# Patient Record
Sex: Male | Born: 1955 | Race: White | Hispanic: No | Smoking: Current every day smoker
Health system: Southern US, Community
[De-identification: ages and names within clinical notes are randomized; demographics above are authoritative.]

## PROBLEM LIST (undated history)

## (undated) DIAGNOSIS — N200 Calculus of kidney: Secondary | ICD-10-CM

## (undated) DIAGNOSIS — R42 Dizziness and giddiness: Secondary | ICD-10-CM

## (undated) DIAGNOSIS — I6523 Occlusion and stenosis of bilateral carotid arteries: Secondary | ICD-10-CM

## (undated) DIAGNOSIS — R0600 Dyspnea, unspecified: Secondary | ICD-10-CM

## (undated) DIAGNOSIS — M5136 Other intervertebral disc degeneration, lumbar region with discogenic back pain only: Secondary | ICD-10-CM

## (undated) DIAGNOSIS — G588 Other specified mononeuropathies: Secondary | ICD-10-CM

## (undated) DIAGNOSIS — E278 Other specified disorders of adrenal gland: Secondary | ICD-10-CM

## (undated) DIAGNOSIS — IMO0001 Reserved for inherently not codable concepts without codable children: Secondary | ICD-10-CM

## (undated) DIAGNOSIS — I10 Essential (primary) hypertension: Secondary | ICD-10-CM

## (undated) DIAGNOSIS — M5134 Other intervertebral disc degeneration, thoracic region: Secondary | ICD-10-CM

## (undated) DIAGNOSIS — F17200 Nicotine dependence, unspecified, uncomplicated: Secondary | ICD-10-CM

## (undated) DIAGNOSIS — R7303 Prediabetes: Secondary | ICD-10-CM

## (undated) DIAGNOSIS — C801 Malignant (primary) neoplasm, unspecified: Secondary | ICD-10-CM

## (undated) DIAGNOSIS — M48061 Spinal stenosis, lumbar region without neurogenic claudication: Secondary | ICD-10-CM

## (undated) DIAGNOSIS — I639 Cerebral infarction, unspecified: Secondary | ICD-10-CM

## (undated) DIAGNOSIS — E119 Type 2 diabetes mellitus without complications: Secondary | ICD-10-CM

## (undated) DIAGNOSIS — A048 Other specified bacterial intestinal infections: Secondary | ICD-10-CM

## (undated) DIAGNOSIS — K31A0 Gastric intestinal metaplasia, unspecified: Secondary | ICD-10-CM

## (undated) DIAGNOSIS — K219 Gastro-esophageal reflux disease without esophagitis: Secondary | ICD-10-CM

## (undated) DIAGNOSIS — Z794 Long term (current) use of insulin: Secondary | ICD-10-CM

## (undated) DIAGNOSIS — E785 Hyperlipidemia, unspecified: Secondary | ICD-10-CM

## (undated) DIAGNOSIS — M47816 Spondylosis without myelopathy or radiculopathy, lumbar region: Secondary | ICD-10-CM

## (undated) DIAGNOSIS — R229 Localized swelling, mass and lump, unspecified: Secondary | ICD-10-CM

## (undated) DIAGNOSIS — I2510 Atherosclerotic heart disease of native coronary artery without angina pectoris: Secondary | ICD-10-CM

## (undated) DIAGNOSIS — F32A Depression, unspecified: Secondary | ICD-10-CM

## (undated) DIAGNOSIS — E1121 Type 2 diabetes mellitus with diabetic nephropathy: Secondary | ICD-10-CM

## (undated) DIAGNOSIS — J449 Chronic obstructive pulmonary disease, unspecified: Secondary | ICD-10-CM

## (undated) DIAGNOSIS — G894 Chronic pain syndrome: Secondary | ICD-10-CM

## (undated) DIAGNOSIS — I70263 Atherosclerosis of native arteries of extremities with gangrene, bilateral legs: Secondary | ICD-10-CM

## (undated) HISTORY — PX: SHOULDER SURGERY: SHX246

## (undated) HISTORY — DX: Type 2 diabetes mellitus without complications: E11.9

## (undated) HISTORY — PX: CHOLECYSTECTOMY: SHX55

## (undated) HISTORY — PX: PROSTATE SURGERY: SHX751

## (undated) HISTORY — DX: Chronic obstructive pulmonary disease, unspecified: J44.9

## (undated) HISTORY — DX: Hyperlipidemia, unspecified: E78.5

## (undated) HISTORY — PX: INSERTION PROSTATE RADIATION SEED: SUR718

## (undated) HISTORY — DX: Essential (primary) hypertension: I10

## (undated) HISTORY — DX: Cerebral infarction, unspecified: I63.9

---

## 2009-12-25 ENCOUNTER — Ambulatory Visit: Payer: Self-pay | Admitting: Urology

## 2013-04-13 ENCOUNTER — Emergency Department: Payer: Self-pay | Admitting: Emergency Medicine

## 2013-04-13 LAB — COMPREHENSIVE METABOLIC PANEL
Albumin: 4.1 g/dL (ref 3.4–5.0)
Anion Gap: 5 — ABNORMAL LOW (ref 7–16)
BUN: 15 mg/dL (ref 7–18)
Bilirubin,Total: 0.6 mg/dL (ref 0.2–1.0)
Calcium, Total: 9.3 mg/dL (ref 8.5–10.1)
Co2: 25 mmol/L (ref 21–32)
Creatinine: 1.31 mg/dL — ABNORMAL HIGH (ref 0.60–1.30)
EGFR (Non-African Amer.): >60
Glucose: 155 mg/dL — ABNORMAL HIGH (ref 65–99)
Osmolality: 278 (ref 275–301)
SGOT(AST): 23 U/L (ref 15–37)
Sodium: 137 mmol/L (ref 136–145)
Total Protein: 7.3 g/dL (ref 6.4–8.2)

## 2013-04-13 LAB — URINALYSIS, COMPLETE
Bilirubin,UR: NEGATIVE
Leukocyte Esterase: NEGATIVE
Nitrite: NEGATIVE
Protein: NEGATIVE
RBC,UR: 88 /HPF (ref 0–5)
Squamous Epithelial: <1
WBC UR: 2 /HPF (ref 0–5)

## 2013-04-13 LAB — CBC
HGB: 16 g/dL (ref 13.0–18.0)
MCH: 32.6 pg (ref 26.0–34.0)
MCHC: 34.3 g/dL (ref 32.0–36.0)
Platelet: 234 10*3/uL (ref 150–440)
RBC: 4.91 10*6/uL (ref 4.40–5.90)
WBC: 12.3 10*3/uL — ABNORMAL HIGH (ref 3.8–10.6)

## 2013-05-25 DIAGNOSIS — J101 Influenza due to other identified influenza virus with other respiratory manifestations: Secondary | ICD-10-CM

## 2013-05-25 DIAGNOSIS — J209 Acute bronchitis, unspecified: Secondary | ICD-10-CM

## 2013-05-25 DIAGNOSIS — C61 Malignant neoplasm of prostate: Secondary | ICD-10-CM

## 2013-05-25 HISTORY — DX: Acute bronchitis, unspecified: J20.9

## 2013-05-25 HISTORY — DX: Malignant neoplasm of prostate: C61

## 2013-05-25 HISTORY — DX: Influenza due to other identified influenza virus with other respiratory manifestations: J10.1

## 2013-08-08 DIAGNOSIS — J101 Influenza due to other identified influenza virus with other respiratory manifestations: Secondary | ICD-10-CM | POA: Insufficient documentation

## 2013-08-08 DIAGNOSIS — F17200 Nicotine dependence, unspecified, uncomplicated: Secondary | ICD-10-CM | POA: Insufficient documentation

## 2013-08-08 DIAGNOSIS — J209 Acute bronchitis, unspecified: Secondary | ICD-10-CM | POA: Insufficient documentation

## 2013-08-09 DIAGNOSIS — R229 Localized swelling, mass and lump, unspecified: Secondary | ICD-10-CM | POA: Insufficient documentation

## 2014-02-02 DIAGNOSIS — C61 Malignant neoplasm of prostate: Secondary | ICD-10-CM | POA: Insufficient documentation

## 2014-02-02 DIAGNOSIS — R7303 Prediabetes: Secondary | ICD-10-CM | POA: Insufficient documentation

## 2014-03-12 DIAGNOSIS — R42 Dizziness and giddiness: Secondary | ICD-10-CM | POA: Insufficient documentation

## 2016-12-19 ENCOUNTER — Encounter: Payer: Self-pay | Admitting: Emergency Medicine

## 2016-12-19 ENCOUNTER — Emergency Department
Admission: EM | Admit: 2016-12-19 | Discharge: 2016-12-19 | Disposition: A | Discharge status: Home / Self Care | Payer: Self-pay | Attending: Emergency Medicine | Admitting: Emergency Medicine

## 2016-12-19 DIAGNOSIS — F17200 Nicotine dependence, unspecified, uncomplicated: Secondary | ICD-10-CM | POA: Insufficient documentation

## 2016-12-19 DIAGNOSIS — Z91040 Latex allergy status: Secondary | ICD-10-CM | POA: Insufficient documentation

## 2016-12-19 DIAGNOSIS — K2970 Gastritis, unspecified, without bleeding: Secondary | ICD-10-CM | POA: Insufficient documentation

## 2016-12-19 DIAGNOSIS — R1013 Epigastric pain: Secondary | ICD-10-CM | POA: Insufficient documentation

## 2016-12-19 HISTORY — DX: Malignant (primary) neoplasm, unspecified: C80.1

## 2016-12-19 LAB — CBC
HEMATOCRIT: 46.7 % (ref 40.0–52.0)
Hemoglobin: 16.2 g/dL (ref 13.0–18.0)
MCH: 32.9 pg (ref 26.0–34.0)
MCHC: 34.8 g/dL (ref 32.0–36.0)
MCV: 94.6 fL (ref 80.0–100.0)
PLATELETS: 228 10*3/uL (ref 150–440)
RBC: 4.93 MIL/uL (ref 4.40–5.90)
RDW: 13.1 % (ref 11.5–14.5)
WBC: 8.3 10*3/uL (ref 3.8–10.6)

## 2016-12-19 LAB — COMPREHENSIVE METABOLIC PANEL
ALT: 31 U/L (ref 17–63)
AST: 28 U/L (ref 15–41)
Albumin: 4 g/dL (ref 3.5–5.0)
Alkaline Phosphatase: 83 U/L (ref 38–126)
Anion gap: 8 (ref 5–15)
BUN: 17 mg/dL (ref 6–20)
CHLORIDE: 106 mmol/L (ref 101–111)
CO2: 24 mmol/L (ref 22–32)
CREATININE: 1.1 mg/dL (ref 0.61–1.24)
Calcium: 9.3 mg/dL (ref 8.9–10.3)
GFR calc Af Amer: >60 mL/min (ref >60)
GFR calc non Af Amer: >60 mL/min (ref >60)
Glucose, Bld: 103 mg/dL — ABNORMAL HIGH (ref 65–99)
POTASSIUM: 3.9 mmol/L (ref 3.5–5.1)
SODIUM: 138 mmol/L (ref 135–145)
Total Bilirubin: 0.3 mg/dL (ref 0.3–1.2)
Total Protein: 6.9 g/dL (ref 6.5–8.1)

## 2016-12-19 LAB — URINALYSIS, COMPLETE (UACMP) WITH MICROSCOPIC
BACTERIA UA: NONE SEEN
BILIRUBIN URINE: NEGATIVE
Glucose, UA: NEGATIVE mg/dL
HGB URINE DIPSTICK: NEGATIVE
KETONES UR: NEGATIVE mg/dL
LEUKOCYTES UA: NEGATIVE
Nitrite: NEGATIVE
PROTEIN: NEGATIVE mg/dL
RBC / HPF: NONE SEEN RBC/hpf (ref 0–5)
SPECIFIC GRAVITY, URINE: 1.01 (ref 1.005–1.030)
SQUAMOUS EPITHELIAL / LPF: NONE SEEN
pH: 6 (ref 5.0–8.0)

## 2016-12-19 LAB — TROPONIN I

## 2016-12-19 LAB — LIPASE, BLOOD: LIPASE: 33 U/L (ref 11–51)

## 2016-12-19 MED ORDER — SUCRALFATE 1 G PO TABS: 1.0000 g | ORAL_TABLET | Freq: Four times a day (QID) | ORAL | 0 refills | Status: DC

## 2016-12-19 MED ORDER — OMEPRAZOLE 40 MG PO CPDR: 40.0000 mg | DELAYED_RELEASE_CAPSULE | Freq: Every day | ORAL | 0 refills | Status: DC

## 2016-12-19 MED ORDER — GI COCKTAIL ~~LOC~~
30.0000 mL | Freq: Once | ORAL | Status: AC
  Administered 2016-12-19: 30 mL via ORAL
  Filled 2016-12-19: qty 30

## 2016-12-19 NOTE — ED Triage Notes (Signed)
Pt to triage via EMS from home, report awoken by sharp epigastric pain, vomit x 2, nausea has subsided now.  Reports intermittent pains like this over past month.  Pt in NAD at this time.

## 2016-12-19 NOTE — ED Provider Notes (Signed)
[facility] Emergency Department Provider Note  ____________________________________________   First MD Initiated Contact with Patient 12/19/16 0354     (approximate)  I have reviewed the triage vital signs and the nursing notes.   HISTORY  Chief Complaint Abdominal Pain   HPI Devin Mayer is a 61 y.o. male with a history of prostate cancer who is presenting to the emergency Department today with epigastric abdominal pain. He says that he has had a sharp epigastric pain over the past month. He says that it worsens after eating and is associated with nausea. He has had several instances where the pain radiates through to his back. He says that he awoke tonight from sleep with sharp epigastric pain and vomited twice. He says the vomitus was clear. He denies any diarrhea. Says that he does not take any medications at home and has no history of reflux. Says that the pain is moderate right now and was severe when he woke up initially.   Past Medical History:  Diagnosis Date  . Cancer ([place])     There are no active problems to display for this patient.   History reviewed. No pertinent surgical history.  Prior to Admission medications   Not on File    Allergies Latex  History reviewed. No pertinent family history.  Social History Social History  Substance Use Topics  . Smoking status: Current Every Day Smoker  . Smokeless tobacco: Never Used  . Alcohol use Yes    Review of Systems  Constitutional: No fever/chills Eyes: No visual changes. ENT: No sore throat. Cardiovascular: Denies chest pain. Respiratory: Denies shortness of breath. Gastrointestinal:  No diarrhea.  No constipation. Genitourinary: Negative for dysuria. Musculoskeletal: Negative for back pain. Skin: Negative for rash. Neurological: Negative for headaches, focal weakness or numbness.   ____________________________________________   PHYSICAL EXAM:  VITAL SIGNS: ED  Triage Vitals  Enc Vitals Group     BP 12/19/16 0105 (!) 144/91     Pulse Rate 12/19/16 0105 90     Resp 12/19/16 0105 16     Temp 12/19/16 0105 97.9 F (36.6 C)     Temp Source 12/19/16 0105 Oral     SpO2 12/19/16 0105 94 %     Weight 12/19/16 0106 165 lb (74.8 kg)     Height 12/19/16 0106 5\' 6"  (1.676 m)     Head Circumference --      Peak Flow --      Pain Score 12/19/16 0105 7     Pain Loc --      Pain Edu? --      Excl. in [place]? --     Constitutional: Alert and oriented. Well appearing and in no acute distress. Eyes: Conjunctivae are normal.  Head: Atraumatic. Nose: No congestion/rhinnorhea. Mouth/Throat: Mucous membranes are moist.  Neck: No stridor.   Cardiovascular: Normal rate, regular rhythm. Grossly normal heart sounds.   Respiratory: Normal respiratory effort.  No retractions. Lungs CTAB. Gastrointestinal: Soft With moderate epigastric tenderness palpation. Negative Murphy sign. No distention.  Musculoskeletal: No lower extremity tenderness nor edema.  No joint effusions. Neurologic:  Normal speech and language. No gross focal neurologic deficits are appreciated. Skin:  Skin is warm, dry and intact. No rash noted. Psychiatric: Mood and affect are normal. Speech and behavior are normal.  ____________________________________________   LABS (all labs ordered are listed, but only abnormal results are displayed)  Labs Reviewed  COMPREHENSIVE METABOLIC PANEL - Abnormal; Notable for the following:  Result Value   Glucose, Bld 103 (*)    All other components within normal limits  URINALYSIS, COMPLETE (UACMP) WITH MICROSCOPIC - Abnormal; Notable for the following:    Color, Urine YELLOW (*)    APPearance CLEAR (*)    All other components within normal limits  LIPASE, BLOOD  CBC  TROPONIN I   ____________________________________________  EKG  ED ECG REPORT I, Schaevitz,  Youlanda Roys, the attending physician, personally viewed and interpreted this ECG.    Date: 12/19/2016  EKG Time: 0121  Rate: 86  Rhythm: normal sinus rhythm  Axis: Normal  Intervals:Incomplete right bundle branch block  ST&T Change: No ST segment elevation or depression. No abnormal T-wave inversion.  ____________________________________________  RADIOLOGY   ____________________________________________   PROCEDURES  Procedure(s) performed:   Procedures  Critical Care performed:   ____________________________________________   INITIAL IMPRESSION / ASSESSMENT AND PLAN / ED COURSE  Pertinent labs & imaging results that were available during my care of the patient were reviewed by me and considered in my medical decision making (see chart for details).  ----------------------------------------- 4:46 AM on 12/19/2016 -----------------------------------------  Patient is pain-free after GI cocktail. Reexamined his abdomen and he is nontender. Possible gastritis versus gastric ulcer. Reassuring lab work. I discussed further workup with the patient in the office with gastroenterology which may include an endoscopy. He'll be discharged on a PPI as well as sucralfate. He is understanding of the plan and willing to comply. Ongoing symptoms for one month and reassuring lab work as well as resolution of symptoms with GI cocktail. Very unlikely to be other catastrophic intra-abdominal pathology such as vascular catastrophe. We did discuss a mass such the cancer and the need for further follow-up. The patient as well as his daughter are understanding of this plan and willing to comply.       ____________________________________________   FINAL CLINICAL IMPRESSION(S) / ED DIAGNOSES  Epigastric pain. Gastritis.    NEW MEDICATIONS STARTED DURING THIS VISIT:  New Prescriptions   No medications on file     Note:  This document was prepared using Dragon voice recognition software and may include unintentional dictation errors.     Orbie Pyo,  MD 12/19/16 [phone]

## 2019-05-26 DIAGNOSIS — E8770 Fluid overload, unspecified: Secondary | ICD-10-CM

## 2019-05-26 DIAGNOSIS — K921 Melena: Secondary | ICD-10-CM

## 2019-05-26 HISTORY — DX: Melena: K92.1

## 2019-05-26 HISTORY — DX: Fluid overload, unspecified: E87.70

## 2019-10-19 DIAGNOSIS — E8770 Fluid overload, unspecified: Secondary | ICD-10-CM | POA: Insufficient documentation

## 2019-10-19 DIAGNOSIS — E278 Other specified disorders of adrenal gland: Secondary | ICD-10-CM | POA: Insufficient documentation

## 2019-10-19 DIAGNOSIS — N200 Calculus of kidney: Secondary | ICD-10-CM | POA: Insufficient documentation

## 2019-10-25 DIAGNOSIS — I10 Essential (primary) hypertension: Secondary | ICD-10-CM | POA: Insufficient documentation

## 2019-11-22 DIAGNOSIS — K921 Melena: Secondary | ICD-10-CM | POA: Insufficient documentation

## 2020-05-14 DIAGNOSIS — A048 Other specified bacterial intestinal infections: Secondary | ICD-10-CM | POA: Insufficient documentation

## 2020-05-14 DIAGNOSIS — K31A0 Gastric intestinal metaplasia, unspecified: Secondary | ICD-10-CM | POA: Insufficient documentation

## 2020-05-25 DIAGNOSIS — G8929 Other chronic pain: Secondary | ICD-10-CM

## 2020-05-25 HISTORY — DX: Other chronic pain: G89.29

## 2020-07-09 ENCOUNTER — Other Ambulatory Visit: Payer: Self-pay | Admitting: Ophthalmology

## 2020-07-09 DIAGNOSIS — H4921 Sixth [abducent] nerve palsy, right eye: Secondary | ICD-10-CM

## 2020-07-09 DIAGNOSIS — H532 Diplopia: Secondary | ICD-10-CM

## 2020-07-10 ENCOUNTER — Other Ambulatory Visit
Admission: RE | Admit: 2020-07-10 | Discharge: 2020-07-10 | Disposition: A | Discharge status: Home / Self Care | Payer: 59 | Source: Ambulatory Visit | Attending: Ophthalmology | Admitting: Ophthalmology

## 2020-07-10 DIAGNOSIS — H4921 Sixth [abducent] nerve palsy, right eye: Secondary | ICD-10-CM | POA: Insufficient documentation

## 2020-07-10 DIAGNOSIS — H532 Diplopia: Secondary | ICD-10-CM | POA: Insufficient documentation

## 2020-07-10 LAB — CBC WITH DIFFERENTIAL/PLATELET
Abs Immature Granulocytes: 0.02 10*3/uL (ref 0.00–0.07)
Basophils Absolute: 0.1 10*3/uL (ref 0.0–0.1)
Basophils Relative: 1 %
Eosinophils Absolute: 0.3 10*3/uL (ref 0.0–0.5)
Eosinophils Relative: 5 %
HCT: 49.4 % (ref 39.0–52.0)
Hemoglobin: 17.4 g/dL — ABNORMAL HIGH (ref 13.0–17.0)
Immature Granulocytes: 0 %
Lymphocytes Relative: 28 %
Lymphs Abs: 2 10*3/uL (ref 0.7–4.0)
MCH: 32.6 pg (ref 26.0–34.0)
MCHC: 35.2 g/dL (ref 30.0–36.0)
MCV: 92.5 fL (ref 80.0–100.0)
Monocytes Absolute: 0.6 10*3/uL (ref 0.1–1.0)
Monocytes Relative: 8 %
Neutro Abs: 4.1 10*3/uL (ref 1.7–7.7)
Neutrophils Relative %: 58 %
Platelets: 213 10*3/uL (ref 150–400)
RBC: 5.34 MIL/uL (ref 4.22–5.81)
RDW: 12.4 % (ref 11.5–15.5)
WBC: 7.2 10*3/uL (ref 4.0–10.5)
nRBC: 0 % (ref 0.0–0.2)

## 2020-07-10 LAB — SEDIMENTATION RATE: Sed Rate: 7 mm/hr (ref 0–20)

## 2020-07-10 LAB — C-REACTIVE PROTEIN: CRP: 2.3 mg/dL — ABNORMAL HIGH (ref <1.0)

## 2020-07-19 ENCOUNTER — Ambulatory Visit
Admission: RE | Admit: 2020-07-19 | Discharge: 2020-07-19 | Disposition: A | Discharge status: Home / Self Care | Payer: 59 | Source: Ambulatory Visit | Attending: Ophthalmology | Admitting: Ophthalmology

## 2020-07-19 ENCOUNTER — Other Ambulatory Visit: Payer: Self-pay

## 2020-07-19 DIAGNOSIS — H532 Diplopia: Secondary | ICD-10-CM | POA: Insufficient documentation

## 2020-07-19 DIAGNOSIS — H4921 Sixth [abducent] nerve palsy, right eye: Secondary | ICD-10-CM | POA: Insufficient documentation

## 2020-07-19 MED ORDER — GADOBUTROL 1 MMOL/ML IV SOLN
7.5000 mL | Freq: Once | INTRAVENOUS | Status: AC | PRN
  Administered 2020-07-19: 7.5 mL via INTRAVENOUS

## 2020-07-25 ENCOUNTER — Other Ambulatory Visit (INDEPENDENT_AMBULATORY_CARE_PROVIDER_SITE_OTHER): Payer: Self-pay | Admitting: Vascular Surgery

## 2020-07-25 DIAGNOSIS — R2681 Unsteadiness on feet: Secondary | ICD-10-CM

## 2020-07-25 DIAGNOSIS — I639 Cerebral infarction, unspecified: Secondary | ICD-10-CM

## 2020-07-25 DIAGNOSIS — H532 Diplopia: Secondary | ICD-10-CM

## 2020-07-26 ENCOUNTER — Ambulatory Visit (INDEPENDENT_AMBULATORY_CARE_PROVIDER_SITE_OTHER): Payer: 59 | Admitting: Nurse Practitioner

## 2020-07-26 ENCOUNTER — Encounter (INDEPENDENT_AMBULATORY_CARE_PROVIDER_SITE_OTHER): Payer: Self-pay | Admitting: Nurse Practitioner

## 2020-07-26 ENCOUNTER — Ambulatory Visit (INDEPENDENT_AMBULATORY_CARE_PROVIDER_SITE_OTHER): Payer: 59

## 2020-07-26 ENCOUNTER — Other Ambulatory Visit: Payer: Self-pay

## 2020-07-26 VITALS — BP 146/82 | HR 80 | Resp 16 | Ht 66.0 in | Wt 176.0 lb

## 2020-07-26 DIAGNOSIS — F17200 Nicotine dependence, unspecified, uncomplicated: Secondary | ICD-10-CM

## 2020-07-26 DIAGNOSIS — I639 Cerebral infarction, unspecified: Secondary | ICD-10-CM | POA: Diagnosis not present

## 2020-07-26 DIAGNOSIS — H532 Diplopia: Secondary | ICD-10-CM | POA: Diagnosis not present

## 2020-07-26 DIAGNOSIS — I10 Essential (primary) hypertension: Secondary | ICD-10-CM

## 2020-07-26 DIAGNOSIS — I6523 Occlusion and stenosis of bilateral carotid arteries: Secondary | ICD-10-CM

## 2020-07-26 DIAGNOSIS — R2681 Unsteadiness on feet: Secondary | ICD-10-CM

## 2020-07-26 NOTE — Progress Notes (Signed)
Subjective:    Patient ID: Devin Mayer, male    DOB: 05/11/1956, 66 y.o.   MRN: [id number] Chief Complaint  Patient presents with  . New Patient (Initial Visit)    Ref [place] carotid evaluate to see if pt need TA bx, stroke 07/07/20    Devin Mayer is a 65 year old male that presents today for evaluation related to recent vision changes.  The patient's wife notes that the patient was recently diagnosed with a stroke on 07/07/2020.  She notes that he had a "vessel burst" in his head and and they were told that this stopped spontaneously.  This also coincides with when his vision changes began.  His wife also notes that he has had trouble with his blood pressure recently and some reaching into the 200s.  Since that time the patient continues to have issues with vision as well as weakness.  He denies any fever or chills.  He denies any claudication or rest pain like symptoms.  Duplex ultrasound shows <40% stenosis bilaterally.  No previous test to compare to.   Review of Systems  Eyes: Positive for visual disturbance.  Neurological: Positive for weakness.  All other systems reviewed and are negative.      Objective:   Physical Exam Vitals reviewed.  HENT:     Head: Normocephalic.  Neck:     Vascular: No carotid bruit.  Cardiovascular:     Rate and Rhythm: Normal rate.     Pulses: Normal pulses.  Pulmonary:     Effort: Pulmonary effort is normal.  Skin:    General: Skin is warm and dry.  Neurological:     Mental Status: He is alert and oriented to person, place, and time.     Motor: Weakness present.  Psychiatric:        Mood and Affect: Mood normal.        Behavior: Behavior normal.        Thought Content: Thought content normal.        Judgment: Judgment normal.     BP (!) 146/82 (BP Location: Right Arm)   Pulse 80   Resp 16   Ht 5\' 6"  (1.676 m)   Wt 176 lb (79.8 kg)   BMI 28.41 kg/m   Past Medical History:  Diagnosis Date  . Cancer ([place])   . COPD  (chronic obstructive pulmonary disease) ([place])   . Diabetes mellitus without complication ([place])   . Hyperlipidemia   . Hypertension   . Stroke [facility])     Social History   Socioeconomic History  . Marital status: Married    Spouse name: Not on file  . Number of children: Not on file  . Years of education: Not on file  . Highest education level: Not on file  Occupational History  . Not on file  Tobacco Use  . Smoking status: Current Every Day Smoker    Types: Cigars  . Smokeless tobacco: Never Used  Substance and Sexual Activity  . Alcohol use: Yes  . Drug use: Never  . Sexual activity: Not on file  Other Topics Concern  . Not on file  Social History Narrative  . Not on file   Social Determinants of Health   Financial Resource Strain: Not on file  Food Insecurity: Not on file  Transportation Needs: Not on file  Physical Activity: Not on file  Stress: Not on file  Social Connections: Not on file  Intimate Partner Violence: Not on file    Past  Surgical History:  Procedure Laterality Date  . CHOLECYSTECTOMY    . SHOULDER SURGERY Bilateral     Family History  Problem Relation Age of Onset  . Heart attack Father   . Diabetes Father   . Heart attack Paternal Uncle   . Lung cancer Maternal Grandmother   . Lung cancer Paternal Grandmother     Allergies  Allergen Reactions  . Latex Rash    CBC Latest Ref Rng & Units 07/10/2020 12/19/2016 04/13/2013  WBC 4.0 - 10.5 K/uL 7.2 8.3 12.3(H)  Hemoglobin 13.0 - 17.0 g/dL 17.4(H) 16.2 16.0  Hematocrit 39.0 - 52.0 % 49.4 46.7 46.6  Platelets 150 - 400 K/uL 213 228 234      CMP     Component Value Date/Time   NA 138 12/19/2016 0113   NA 137 04/13/2013 1040   K 3.9 12/19/2016 0113   K 4.3 04/13/2013 1040   CL 106 12/19/2016 0113   CL 107 04/13/2013 1040   CO2 24 12/19/2016 0113   CO2 25 04/13/2013 1040   GLUCOSE 103 (H) 12/19/2016 0113   GLUCOSE 155 (H) 04/13/2013 1040   BUN 17 12/19/2016 0113   BUN 15  04/13/2013 1040   CREATININE 1.10 12/19/2016 0113   CREATININE 1.31 (H) 04/13/2013 1040   CALCIUM 9.3 12/19/2016 0113   CALCIUM 9.3 04/13/2013 1040   PROT 6.9 12/19/2016 0113   PROT 7.3 04/13/2013 1040   ALBUMIN 4.0 12/19/2016 0113   ALBUMIN 4.1 04/13/2013 1040   AST 28 12/19/2016 0113   AST 23 04/13/2013 1040   ALT 31 12/19/2016 0113   ALT 32 04/13/2013 1040   ALKPHOS 83 12/19/2016 0113   ALKPHOS 98 04/13/2013 1040   BILITOT 0.3 12/19/2016 0113   BILITOT 0.6 04/13/2013 1040   GFRNONAA >60 12/19/2016 0113   GFRNONAA >60 04/13/2013 1040   GFRAA >60 12/19/2016 0113   GFRAA >60 04/13/2013 1040     No results found.     Assessment & Plan:   1. Bilateral carotid artery stenosis Recommend:  Given the patient's asymptomatic subcritical stenosis no further invasive testing or surgery at this time.  Duplex ultrasound shows <40% stenosis bilaterally.  Continue antiplatelet therapy as prescribed Continue management of CAD, HTN and Hyperlipidemia Healthy heart diet,  encouraged exercise at least 4 times per week Follow up in 12 months with duplex ultrasound and physical exam   2. Primary hypertension Continue antihypertensive medications as already ordered, these medications have been reviewed and there are no changes at this time.  Patient is also advised to follow closely with PCP in regards to hypertension.  The patient's wife notes that he has had elevations previously up to over 200 and this may account for cause of stroke.   3. Smoking Smoking cessation was discussed, 3-10 minutes spent on this topic specifically    Current Outpatient Medications on File Prior to Visit  Medication Sig Dispense Refill  . Aspirin-Caffeine (BAYER BACK & BODY) 500-32.5 MG TABS Take by mouth.    . famotidine (PEPCID) 20 MG tablet TAKE 1 TABLET (20 MG TOTAL) BY MOUTH TWO (2) TIMES A DAY.    . hydrochlorothiazide (HYDRODIURIL) 25 MG tablet TAKE 1 TABLET (25 MG TOTAL) BY MOUTH DAILY. FOR HIGH  BLOOD PRESSURE AND SWELLING    . metFORMIN (GLUCOPHAGE) 500 MG tablet TAKE 1 TABLET WITH DINNER FOR HIGH SUGAR.    Marland Kitchen omeprazole (PRILOSEC) 40 MG capsule Take 1 capsule (40 mg total) by mouth daily. 30 capsule 0  .  sucralfate (CARAFATE) 1 g tablet Take 1 tablet (1 g total) by mouth 4 (four) times daily. 120 tablet 0   No current facility-administered medications on file prior to visit.    There are no Patient Instructions on file for this visit. No follow-ups on file.   Kris Hartmann, NP

## 2020-07-30 LAB — HEMOGLOBIN A1C: Hemoglobin A1C: 7.8

## 2020-08-01 DIAGNOSIS — I6523 Occlusion and stenosis of bilateral carotid arteries: Secondary | ICD-10-CM | POA: Insufficient documentation

## 2020-10-10 ENCOUNTER — Other Ambulatory Visit: Payer: Self-pay | Admitting: Internal Medicine

## 2020-10-10 DIAGNOSIS — G44201 Tension-type headache, unspecified, intractable: Secondary | ICD-10-CM

## 2020-10-10 DIAGNOSIS — R42 Dizziness and giddiness: Secondary | ICD-10-CM

## 2020-10-11 ENCOUNTER — Other Ambulatory Visit: Payer: Self-pay

## 2020-10-11 ENCOUNTER — Ambulatory Visit
Admission: RE | Admit: 2020-10-11 | Discharge: 2020-10-11 | Disposition: A | Discharge status: Home / Self Care | Payer: 59 | Source: Ambulatory Visit | Attending: Internal Medicine | Admitting: Internal Medicine

## 2020-10-11 DIAGNOSIS — G44201 Tension-type headache, unspecified, intractable: Secondary | ICD-10-CM | POA: Diagnosis present

## 2020-10-11 DIAGNOSIS — R42 Dizziness and giddiness: Secondary | ICD-10-CM | POA: Diagnosis not present

## 2020-10-18 ENCOUNTER — Other Ambulatory Visit: Payer: Self-pay | Admitting: Physician Assistant

## 2020-10-18 DIAGNOSIS — R0781 Pleurodynia: Secondary | ICD-10-CM

## 2020-10-24 ENCOUNTER — Ambulatory Visit: Payer: 59

## 2020-10-25 ENCOUNTER — Ambulatory Visit
Admission: RE | Admit: 2020-10-25 | Discharge: 2020-10-25 | Disposition: A | Discharge status: Home / Self Care | Payer: 59 | Source: Ambulatory Visit | Attending: Physician Assistant | Admitting: Physician Assistant

## 2020-10-25 ENCOUNTER — Ambulatory Visit: Payer: 59

## 2020-10-25 ENCOUNTER — Other Ambulatory Visit: Payer: Self-pay

## 2020-10-25 ENCOUNTER — Other Ambulatory Visit: Payer: 59

## 2020-10-25 DIAGNOSIS — R0781 Pleurodynia: Secondary | ICD-10-CM | POA: Diagnosis not present

## 2020-12-24 ENCOUNTER — Ambulatory Visit: Payer: 59 | Attending: Neurology | Admitting: Physical Therapy

## 2020-12-26 ENCOUNTER — Ambulatory Visit: Payer: 59 | Admitting: Physical Therapy

## 2020-12-31 ENCOUNTER — Ambulatory Visit: Payer: 59 | Admitting: Physical Therapy

## 2021-01-02 ENCOUNTER — Ambulatory Visit: Payer: 59 | Admitting: Physical Therapy

## 2021-01-06 ENCOUNTER — Encounter: Payer: 59 | Admitting: Physical Therapy

## 2021-01-08 ENCOUNTER — Encounter: Payer: 59 | Admitting: Physical Therapy

## 2021-01-13 ENCOUNTER — Encounter: Payer: 59 | Admitting: Physical Therapy

## 2021-01-15 ENCOUNTER — Encounter: Payer: 59 | Admitting: Physical Therapy

## 2021-01-20 ENCOUNTER — Encounter: Payer: 59 | Admitting: Physical Therapy

## 2021-01-22 ENCOUNTER — Encounter: Payer: 59 | Admitting: Physical Therapy

## 2021-01-22 ENCOUNTER — Encounter: Admission: RE | Payer: Self-pay | Source: Home / Self Care

## 2021-01-22 ENCOUNTER — Ambulatory Visit: Admission: RE | Admit: 2021-01-22 | Payer: 59 | Source: Home / Self Care | Admitting: Internal Medicine

## 2021-01-22 SURGERY — ESOPHAGOGASTRODUODENOSCOPY (EGD) WITH PROPOFOL
Anesthesia: General

## 2021-03-04 ENCOUNTER — Encounter: Payer: Self-pay | Admitting: Student in an Organized Health Care Education/Training Program

## 2021-03-04 ENCOUNTER — Ambulatory Visit
Payer: 59 | Attending: Student in an Organized Health Care Education/Training Program | Admitting: Student in an Organized Health Care Education/Training Program

## 2021-03-04 ENCOUNTER — Other Ambulatory Visit: Payer: Self-pay

## 2021-03-04 VITALS — BP 127/88 | HR 99 | Temp 97.1°F | Resp 18 | Ht 66.0 in | Wt 175.5 lb

## 2021-03-04 DIAGNOSIS — G8929 Other chronic pain: Secondary | ICD-10-CM

## 2021-03-04 DIAGNOSIS — G588 Other specified mononeuropathies: Secondary | ICD-10-CM

## 2021-03-04 DIAGNOSIS — G894 Chronic pain syndrome: Secondary | ICD-10-CM

## 2021-03-04 DIAGNOSIS — M51369 Other intervertebral disc degeneration, lumbar region without mention of lumbar back pain or lower extremity pain: Secondary | ICD-10-CM

## 2021-03-04 DIAGNOSIS — IMO0001 Reserved for inherently not codable concepts without codable children: Secondary | ICD-10-CM

## 2021-03-04 DIAGNOSIS — M5136 Other intervertebral disc degeneration, lumbar region: Secondary | ICD-10-CM | POA: Insufficient documentation

## 2021-03-04 DIAGNOSIS — M5416 Radiculopathy, lumbar region: Secondary | ICD-10-CM | POA: Insufficient documentation

## 2021-03-04 DIAGNOSIS — F17200 Nicotine dependence, unspecified, uncomplicated: Secondary | ICD-10-CM | POA: Insufficient documentation

## 2021-03-04 DIAGNOSIS — M5134 Other intervertebral disc degeneration, thoracic region: Secondary | ICD-10-CM

## 2021-03-04 NOTE — Assessment & Plan Note (Signed)
Patient has chronic pain. Studies show that people with chronic pain tend to be heavier smokers and often smoke in response to pain. Chronic pain makes smoking cessation more difficult and increases the risk of relapse after quitting.  Pt and I discussed the importance of managing pain during the process of smoking cessation.  We have developed a plan for smoking cessation and relapse prevention that includes management of of smoking in response to pain.

## 2021-03-04 NOTE — Patient Instructions (Signed)

## 2021-03-04 NOTE — Assessment & Plan Note (Signed)
Left-sided radiating thoracic pain that radiates to the anterior chest underneath the rib cage consistent with intercostal neuralgia.  Thoracic MRI negative for disc herniation that could be contributing to such symptoms.  Discussed diagnostic left-sided intercostal nerve block effective possible pulsed radiofrequency ablation.

## 2021-03-04 NOTE — Progress Notes (Signed)
Safety precautions to be maintained throughout the outpatient stay will include: orient to surroundings, keep bed in low position, maintain call bell within reach at all times, provide assistance with transfer out of bed and ambulation.  

## 2021-03-04 NOTE — Progress Notes (Signed)
Patient: Devin Mayer  Service Category: E/M  Provider: Gillis Santa, MD  DOB: 29-Nov-1955  DOS: 03/04/2021  Referring Provider: Gladstone Lighter, MD  MRN: [id number]  Setting: Ambulatory outpatient  PCP: Gladstone Lighter, MD  Type: New Patient  Specialty: Interventional Pain Management    Location: Office  Delivery: Face-to-face     Primary Reason(s) for Visit: Encounter for initial evaluation of one or more chronic problems (new to examiner) potentially causing chronic pain, and posing a threat to normal musculoskeletal function. (Level of risk: High) CC: Back Pain and Hip Pain (left)  HPI  Devin Mayer is a 65 y.o. year old, male patient, who comes for the first time to our practice referred by Gladstone Lighter, MD for our initial evaluation of his chronic pain. He has Acute bronchitis; Adrenal mass 1 cm to 4 cm in diameter ([place]); Intestinal metaplasia of gastric mucosa; Helicobacter pylori infection; Hematochezia; Hypertension; Influenza B; Nephrolithiasis; Pre-diabetes; Prostate cancer ([place]); Smoking; Subcutaneous nodule; Vertigo; Volume overload; Bilateral carotid artery stenosis; Chronic radicular lumbar pain; Lumbar degenerative disc disease; Degeneration of thoracic intervertebral disc; Intercostal neuralgia; and Chronic pain syndrome on their problem list. Today he comes in for evaluation of his Back Pain and Hip Pain (left)  Pain Assessment: Location: Lower Back Radiating: left hip, left ribcage, left leg to "just above knee"; BACK/numb, toothache-like; HIP/sharp; RIBCAGE/ ache Onset: More than a month ago Duration: Chronic pain Quality: Aching, Numbness Severity: 8 /10 (subjective, self-reported pain score)  Effect on ADL:   Timing: Constant Modifying factors: nothing BP: 127/88  HR: 99  Onset and Duration: Gradual Cause of pain: Unknown Severity: Getting worse and NAS-11 at its worse: 10/10 Timing: Not influenced by the time of the day Aggravating Factors: Eating, Walking,  Walking uphill, and Walking downhill Alleviating Factors: Walking Associated Problems: Night-time cramps, Fatigue, Weakness, Pain that wakes patient up, and Pain that does not allow patient to sleep Quality of Pain: Burning, Deep, Distressing, Nagging, Pressure-like, Sharp, Tender, and Tiring Previous Examinations or Tests: CT scan, MRI scan, and X-rays Previous Treatments: The patient denies none noted  Devin Mayer is a very pleasant 65 year old male who is accompanied by his wife who presents with a chief complaint of low back pain that started approximately 2 years ago without any inciting or traumatic event.  Later that year progressed to radiating leg pain as well more pronounced on the left side that extended in a dermatomal distribution to his left knee.  No inciting or traumatic event for onset of radiating leg pain.  Patient had to take retirement given increased pain.  He has been utilizing a cane to ambulate which she started doing approximately 12 months ago.  He had a stroke approximately 6 months ago which resulted in visual impairments for many months which has now resolved.  Patient does not endorse diplopia any longer.  He has tried physical therapy in the past.  He has been managing his pain with 4-5 Aleve's given day.  Patient also endorses left-sided rib pain that is worse with overhead extension.  His thoracic MRI is unremarkable.  Describes a burning and tingling sensation that wraps around his thoracic region to his anterior chest underneath his rib cage.  This seems to be consistent with intercostal neuralgia.  Denies prior history of shingles.  Of note patient has tried various neuropathic's in the past including gabapentin, pregabalin, nortriptyline, Cymbalta with limited response.  He does have a history of type 2 diabetes, not on insulin.  A1c was 7.8.  He denies any paresthesias of his feet.  Historic Controlled Substance Pharmacotherapy Review   Historical  Monitoring: The patient  reports no history of drug use. List of all UDS Test(s): No results found for: MDMA, COCAINSCRNUR, [place], [place], CANNABQUANT, THCU, [place] List of other Serum/Urine Drug Screening Test(s):  No results found for: AMPHSCRSER, BARBSCRSER, BENZOSCRSER, COCAINSCRSER, COCAINSCRNUR, PCPSCRSER, PCPQUANT, THCSCRSER, THCU, CANNABQUANT, OPIATESCRSER, OXYSCRSER, PROPOXSCRSER, ETH Historical Background Evaluation: [place] PMP: PDMP reviewed during this encounter. Online review of the past 48-month period conducted.              [place] Department of public safety, offender search: Editor, commissioning Information) Non-contributory Risk Assessment Profile: Aberrant behavior: None observed or detected today Risk factors for fatal opioid overdose: None identified today  Fatal overdose hazard ratio (HR): Calculation deferred Non-fatal overdose hazard ratio (HR): Calculation deferred Risk of opioid abuse or dependence: 0.7-3.0% with doses ? 36 MME/day and 6.1-26% with doses ? 120 MME/day. Substance use disorder (SUD) risk level: See below Personal History of Substance Abuse (SUD-Substance use disorder):  Alcohol: Negative  Illegal Drugs: Negative  Rx Drugs: Negative  ORT Risk Level calculation: Low Risk  Opioid Risk Tool - 03/04/21 0955       Family History of Substance Abuse   Alcohol Positive Male    Illegal Drugs Negative    Rx Drugs Negative      Personal History of Substance Abuse   Alcohol Negative    Illegal Drugs Negative    Rx Drugs Negative      Age   Age between 76-45 years  No      History of Preadolescent Sexual Abuse   History of Preadolescent Sexual Abuse Negative or Male      Psychological Disease   Psychological Disease Negative    Depression Negative      Total Score   Opioid Risk Tool Scoring 3    Opioid Risk Interpretation Low Risk            ORT Scoring interpretation table:  Score <3 = Low Risk for SUD  Score between 4-7 = Moderate Risk for SUD   Score >8 = High Risk for Opioid Abuse   PHQ-2 Depression Scale:  Total score:    PHQ-2 Scoring interpretation table: (Score and probability of major depressive disorder)  Score 0 = No depression  Score 1 = 15.4% Probability  Score 2 = 21.1% Probability  Score 3 = 38.4% Probability  Score 4 = 45.5% Probability  Score 5 = 56.4% Probability  Score 6 = 78.6% Probability   PHQ-9 Depression Scale:  Total score:    PHQ-9 Scoring interpretation table:  Score 0-4 = No depression  Score 5-9 = Mild depression  Score 10-14 = Moderate depression  Score 15-19 = Moderately severe depression  Score 20-27 = Severe depression (2.4 times higher risk of SUD and 2.89 times higher risk of overuse)   Pharmacologic Plan: As per protocol, I have not taken over any controlled substance management, pending the results of ordered tests and/or consults.            Initial impression: Pending review of available data and ordered tests.  Meds   Current Outpatient Medications:    aspirin EC 81 MG tablet, Take 81 mg by mouth daily. Swallow whole., Disp: , Rfl:    Aspirin-Caffeine (BAYER BACK & BODY) 500-32.5 MG TABS, Take by mouth., Disp: , Rfl:    famotidine (PEPCID) 20 MG tablet, TAKE 1 TABLET (20 MG TOTAL) BY MOUTH  TWO (2) TIMES A DAY., Disp: , Rfl:    hydrochlorothiazide (HYDRODIURIL) 25 MG tablet, TAKE 1 TABLET (25 MG TOTAL) BY MOUTH DAILY. FOR HIGH BLOOD PRESSURE AND SWELLING, Disp: , Rfl:    metFORMIN (GLUCOPHAGE) 500 MG tablet, TAKE 1 TABLET WITH DINNER FOR HIGH SUGAR., Disp: , Rfl:    omeprazole (PRILOSEC) 40 MG capsule, Take 1 capsule (40 mg total) by mouth daily., Disp: 30 capsule, Rfl: 0   sucralfate (CARAFATE) 1 g tablet, Take 1 tablet (1 g total) by mouth 4 (four) times daily., Disp: 120 tablet, Rfl: 0  Imaging Review   Thoracic Imaging: Thoracic MR wo contrast: Results for orders placed during the hospital encounter of 10/25/20  MR THORACIC SPINE WO CONTRAST  Narrative CLINICAL DATA:   Left rib pain.  EXAM: MRI THORACIC SPINE WITHOUT CONTRAST  TECHNIQUE: Multiplanar, multisequence MR imaging of the thoracic spine was performed. No intravenous contrast was administered.  COMPARISON:  CT abdomen pelvis 04/13/2013  FINDINGS: Alignment:  Normal alignment  Vertebrae: Normal bone marrow.  Negative for fracture or mass.  Cord:  Normal signal and morphology.  Paraspinal and other soft tissues: No paraspinous mass or adenopathy. Mild anterior spurring in the lower thoracic spine. Left lateral osteophyte at T6-5 6.  Right adrenal nodule 2.4 x 3.2 cm. This is unchanged from the prior CT 2014 which showed low density in the nodule compatible with adrenal adenoma.  Disc levels:  Mild disc degeneration in the thoracic spine. Mild disc space narrowing at T7-8. Several small Schmorl's nodes are present.  Negative for thoracic disc protrusion or spinal stenosis.  IMPRESSION: Mild thoracic disc degeneration.  No significant stenosis.  Right adrenal nodule unchanged from 2014   Electronically Signed By: Franchot Gallo M.D. On: 10/25/2020 08:48   Complexity Note: Imaging results reviewed. Results shared with Mr. Waggle, using Layman's terms.                         ROS  Cardiovascular: Chest pain and Blood thinners:  Anticoagulant Pulmonary or Respiratory: Shortness of breath, Smoking, Snoring , and Coughing up mucus (Bronchitis) Neurological: Stroke (Residual deficits or weakness: no long term loss noted) Psychological-Psychiatric: No reported psychological or psychiatric signs or symptoms such as difficulty sleeping, anxiety, depression, delusions or hallucinations (schizophrenial), mood swings (bipolar disorders) or suicidal ideations or attempts Gastrointestinal: Reflux or heatburn Genitourinary: Passing kidney stones Hematological: No reported hematological signs or symptoms such as prolonged bleeding, low or poor functioning platelets, bruising or bleeding  easily, hereditary bleeding problems, low energy levels due to low hemoglobin or being anemic Endocrine:  diabetes Rheumatologic: No reported rheumatological signs and symptoms such as fatigue, joint pain, tenderness, swelling, redness, heat, stiffness, decreased range of motion, with or without associated rash Musculoskeletal: Negative for myasthenia gravis, muscular dystrophy, multiple sclerosis or malignant hyperthermia Work History: Retired  Allergies  Mr. Debold is allergic to latex.  Laboratory Chemistry Profile   Renal Lab Results  Component Value Date   BUN 17 12/19/2016   CREATININE 1.10 12/19/2016   GFRAA >60 12/19/2016   GFRNONAA >60 12/19/2016   PROTEINUR NEGATIVE 12/19/2016     Electrolytes Lab Results  Component Value Date   NA 138 12/19/2016   K 3.9 12/19/2016   CL 106 12/19/2016   CALCIUM 9.3 12/19/2016     Hepatic Lab Results  Component Value Date   AST 28 12/19/2016   ALT 31 12/19/2016   ALBUMIN 4.0 12/19/2016   ALKPHOS 83 12/19/2016  LIPASE 33 12/19/2016     ID No results found for: LYMEIGGIGMAB, HIV, [place], STAPHAUREUS, MRSAPCR, HCVAB, PREGTESTUR, RMSFIGG, QFVRPH1IGG, QFVRPH2IGG, LYMEIGGIGMAB   Bone No results found for: VD25OH, IR485IO2VOJ, JK0938HW2, XH3716RC7, 25OHVITD1, 25OHVITD2, 25OHVITD3, TESTOFREE, TESTOSTERONE   Endocrine Lab Results  Component Value Date   GLUCOSE 103 (H) 12/19/2016   GLUCOSEU NEGATIVE 12/19/2016     Neuropathy No results found for: VITAMINB12, FOLATE, HGBA1C, HIV   CNS No results found for: COLORCSF, APPEARCSF, RBCCOUNTCSF, WBCCSF, POLYSCSF, LYMPHSCSF, EOSCSF, PROTEINCSF, GLUCCSF, JCVIRUS, CSFOLI, IGGCSF, LABACHR, ACETBL, LABACHR, ACETBL   Inflammation (CRP: Acute  ESR: Chronic) Lab Results  Component Value Date   CRP 2.3 (H) 07/10/2020   ESRSEDRATE 7 07/10/2020     Rheumatology No results found for: RF, ANA, LABURIC, URICUR, LYMEIGGIGMAB, LYMEABIGMQN, HLAB27   Coagulation Lab Results  Component  Value Date   PLT 213 07/10/2020     Cardiovascular Lab Results  Component Value Date   TROPONINI <0.03 12/19/2016   HGB 17.4 (H) 07/10/2020   HCT 49.4 07/10/2020     Screening No results found for: SARSCOV2NAA, COVIDSOURCE, STAPHAUREUS, MRSAPCR, HCVAB, HIV, PREGTESTUR   Cancer No results found for: CEA, CA125, LABCA2   Allergens No results found for: ALMOND, APPLE, ASPARAGUS, AVOCADO, BANANA, BARLEY, BASIL, BAYLEAF, GREENBEAN, LIMABEAN, WHITEBEAN, BEEFIGE, REDBEET, BLUEBERRY, BROCCOLI, CABBAGE, MELON, CARROT, CASEIN, CASHEWNUT, CAULIFLOWER, CELERY     Note: Lab results reviewed.  [place]  Drug: Mr. Ellenwood  reports no history of drug use. Alcohol:  reports current alcohol use. Tobacco:  reports that he has been smoking cigars. He has never used smokeless tobacco. Medical:  has a past medical history of Cancer ([place]), COPD (chronic obstructive pulmonary disease) ([place]), Diabetes mellitus without complication ([place]), Hyperlipidemia, Hypertension, and Stroke ([place]). Family: family history includes Diabetes in his father; Heart attack in his father and paternal uncle; Lung cancer in his maternal grandmother and paternal grandmother.  Past Surgical History:  Procedure Laterality Date   CHOLECYSTECTOMY     SHOULDER SURGERY Bilateral    Active Ambulatory Problems    Diagnosis Date Noted   Acute bronchitis 08/08/2013   Adrenal mass 1 cm to 4 cm in diameter ([place]) 10/19/2019   Intestinal metaplasia of gastric mucosa 89/38/1017   Helicobacter pylori infection 05/14/2020   Hematochezia 11/22/2019   Hypertension 10/25/2019   Influenza B 08/08/2013   Nephrolithiasis 10/19/2019   Pre-diabetes 02/02/2014   Prostate cancer ([place]) 02/02/2014   Smoking 08/08/2013   Subcutaneous nodule 08/09/2013   Vertigo 03/12/2014   Volume overload 10/19/2019   Bilateral carotid artery stenosis 08/01/2020   Chronic radicular lumbar pain 03/04/2021   Lumbar degenerative disc disease 03/04/2021   Degeneration  of thoracic intervertebral disc 03/04/2021   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   Chronic pain syndrome 03/04/2021   Resolved Ambulatory Problems    Diagnosis Date Noted   No Resolved Ambulatory Problems   Past Medical History:  Diagnosis Date   Cancer ([place])    COPD (chronic obstructive pulmonary disease) ([place])    Diabetes mellitus without complication ([place])    Hyperlipidemia    Stroke ([place])    Constitutional Exam  General appearance: Well nourished, well developed, and well hydrated. In no apparent acute distress Vitals:   03/04/21 1000  BP: 127/88  Pulse: 99  Resp: 18  Temp: (!) 97.1 F (36.2 C)  SpO2: 100%  Weight: 175 lb 8 oz (79.6 kg)  Height: _0  (1.676 m)   BMI Assessment: Estimated body mass index is 28.33 kg/m as calculated  from the following:   Height as of this encounter: $RemoveBeforeD'5\' 6"'[id number]$  (1.676 m).   Weight as of this encounter: 175 lb 8 oz (79.6 kg).  BMI interpretation table: BMI level Category Range association with higher incidence of chronic pain  <18 kg/m2 Underweight   18.5-24.9 kg/m2 Ideal body weight   25-29.9 kg/m2 Overweight Increased incidence by 20%  30-34.9 kg/m2 Obese (Class I) Increased incidence by 68%  35-39.9 kg/m2 Severe obesity (Class II) Increased incidence by 136%  >40 kg/m2 Extreme obesity (Class III) Increased incidence by 254%   Patient's current BMI Ideal Body weight  Body mass index is 28.33 kg/m. Ideal body weight: 63.8 kg (140 lb 10.5 oz) Adjusted ideal body weight: 70.1 kg (154 lb 9.5 oz)   BMI Readings from Last 4 Encounters:  03/04/21 28.33 kg/m  07/26/20 28.41 kg/m  12/19/16 26.63 kg/m   Wt Readings from Last 4 Encounters:  03/04/21 175 lb 8 oz (79.6 kg)  07/26/20 176 lb (79.8 kg)  12/19/16 165 lb (74.8 kg)    Psych/Mental status: Alert, oriented x 3 (person, place, & time)       Eyes: PERLA Respiratory: No evidence of acute respiratory distress  Thoracic Spine Area Exam  Skin & Axial Inspection: No masses, redness,  or swelling Alignment: Symmetrical Functional ROM: Unrestricted ROM Stability: No instability detected Muscle Tone/Strength: Functionally intact. No obvious neuro-muscular anomalies detected. Sensory (Neurological): Neuropathic pain pattern Radiating pain along left intercostal region. Muscle strength & Tone: No palpable anomalies Lumbar Spine Area Exam  Skin & Axial Inspection: No masses, redness, or swelling Alignment: Symmetrical Functional ROM: Pain restricted ROM       Stability: No instability detected Muscle Tone/Strength: Functionally intact. No obvious neuro-muscular anomalies detected. Sensory (Neurological): Dermatomal pain pattern  Ambulation: Unassisted Gait: Relatively normal for age and body habitus Posture: WNL  Lower Extremity Exam    Side: Right lower extremity  Side: Left lower extremity  Stability: No instability observed          Stability: No instability observed          Skin & Extremity Inspection: Skin color, temperature, and hair growth are WNL. No peripheral edema or cyanosis. No masses, redness, swelling, asymmetry, or associated skin lesions. No contractures.  Skin & Extremity Inspection: Skin color, temperature, and hair growth are WNL. No peripheral edema or cyanosis. No masses, redness, swelling, asymmetry, or associated skin lesions. No contractures.  Functional ROM: Unrestricted ROM                  Functional ROM: Unrestricted ROM                  Muscle Tone/Strength: Functionally intact. No obvious neuro-muscular anomalies detected.  Muscle Tone/Strength: Functionally intact. No obvious neuro-muscular anomalies detected.  Sensory (Neurological): Neurogenic pain pattern        Sensory (Neurological): Neurogenic pain pattern        DTR: Patellar: deferred today Achilles: deferred today Plantar: deferred today  DTR: Patellar: deferred today Achilles: deferred today Plantar: deferred today  Palpation: No palpable anomalies  Palpation: No palpable  anomalies    Assessment  Primary Diagnosis & Pertinent Problem List: The primary encounter diagnosis was Chronic radicular lumbar pain. Diagnoses of Lumbar degenerative disc disease, Degeneration of thoracic intervertebral disc, Intercostal neuralgia, Chronic pain syndrome, and Smoking were also pertinent to this visit.  Visit Diagnosis (New problems to examiner): 1. Chronic radicular lumbar pain   2. Lumbar degenerative disc disease   3.  Degeneration of thoracic intervertebral disc   4. Intercostal neuralgia   5. Chronic pain syndrome   6. Smoking    Plan of Care (Initial workup plan)  Note: Mr. Frix was reminded that as per protocol, today's visit has been an evaluation only. We have not taken over the patient's controlled substance management.  Problem-specific plan: Chronic radicular lumbar pain Low back pain with radiation into bilateral lower extremity dermatomal fashion that does not extend down the left knee.  Tried physical therapy in the past and has failed various neuropathic medications including gabapentin, Lyrica, nortriptyline, Cymbalta.  Recommend lumbar epidural steroid injection.    Intercostal neuralgia Left-sided radiating thoracic pain that radiates to the anterior chest underneath the rib cage consistent with intercostal neuralgia.  Thoracic MRI negative for disc herniation that could be contributing to such symptoms.  Discussed diagnostic left-sided intercostal nerve block effective possible pulsed radiofrequency ablation.  Chronic pain syndrome General Recommendations: The pain condition that the patient suffers from is best treated with a multidisciplinary approach that involves an increase in physical activity to prevent de-conditioning and worsening of the pain cycle, as well as psychological counseling (formal and/or informal) to address the co-morbid psychological affects of pain. Treatment will often involve judicious use of pain medications and  interventional procedures to decrease the pain, allowing the patient to participate in the physical activity that will ultimately produce long-lasting pain reductions. The goal of the multidisciplinary approach is to return the patient to a higher level of overall function and to restore their ability to perform activities of daily living.    Smoking Patient has chronic pain. Studies show that people with chronic pain tend to be heavier smokers and often smoke in response to pain. Chronic pain makes smoking cessation more difficult and increases the risk of relapse after quitting.  Pt and I discussed the importance of managing pain during the process of smoking cessation.  We have developed a plan for smoking cessation and relapse prevention that includes management of of smoking in response to pain.   Procedure Orders         Lumbar Epidural Injection     Interventional management options: Mr. Samaras was informed that there is no guarantee that he would be a candidate for interventional therapies. The decision will be based on the results of diagnostic studies, as well as Mr. Kapler's risk profile.  Procedure(s) under consideration:  Lumbar epidural steroid injection SI joint intervention Left intercostal nerve block, possible pulsed radiofrequency ablation    Provider-requested follow-up: Return in about 6 days (around 03/10/2021) for L-ESI, without sedation.  I spent a total of 60 minutes reviewing chart data, face-to-face evaluation with the patient, counseling and coordination of care as detailed above.   Future Appointments  Date Time Provider [place]  03/05/2021 10:00 AM Olin Hauser, DO [facility]  07/25/2021  8:30 AM [facility] 2 [facility] None  07/25/2021  9:30 AM Dew, Erskine Squibb, MD [facility] None    Note by: Gillis Santa, MD Date: 03/04/2021; Time: 2:38 PM

## 2021-03-04 NOTE — Assessment & Plan Note (Signed)
General Recommendations: The pain condition that the patient suffers from is best treated with a multidisciplinary approach that involves an increase in physical activity to prevent de-conditioning and worsening of the pain cycle, as well as psychological counseling (formal and/or informal) to address the co-morbid psychological affects of pain. Treatment will often involve judicious use of pain medications and interventional procedures to decrease the pain, allowing the patient to participate in the physical activity that will ultimately produce long-lasting pain reductions. The goal of the multidisciplinary approach is to return the patient to a higher level of overall function and to restore their ability to perform activities of daily living.

## 2021-03-04 NOTE — Assessment & Plan Note (Signed)
Low back pain with radiation into bilateral lower extremity dermatomal fashion that does not extend down the left knee.  Tried physical therapy in the past and has failed various neuropathic medications including gabapentin, Lyrica, nortriptyline, Cymbalta.  Recommend lumbar epidural steroid injection.

## 2021-03-05 ENCOUNTER — Ambulatory Visit (INDEPENDENT_AMBULATORY_CARE_PROVIDER_SITE_OTHER): Payer: 59 | Admitting: Family Medicine

## 2021-03-05 ENCOUNTER — Encounter: Payer: Self-pay | Admitting: Family Medicine

## 2021-03-05 VITALS — BP 126/74 | HR 91 | Ht 66.0 in | Wt 175.4 lb

## 2021-03-05 DIAGNOSIS — Z7689 Persons encountering health services in other specified circumstances: Secondary | ICD-10-CM | POA: Diagnosis not present

## 2021-03-05 DIAGNOSIS — Z23 Encounter for immunization: Secondary | ICD-10-CM

## 2021-03-05 DIAGNOSIS — Z794 Long term (current) use of insulin: Secondary | ICD-10-CM

## 2021-03-05 DIAGNOSIS — L301 Dyshidrosis [pompholyx]: Secondary | ICD-10-CM

## 2021-03-05 DIAGNOSIS — E1140 Type 2 diabetes mellitus with diabetic neuropathy, unspecified: Secondary | ICD-10-CM

## 2021-03-05 MED ORDER — METFORMIN HCL ER 500 MG PO TB24: 500.0000 mg | ORAL_TABLET | Freq: Every day | ORAL | 3 refills | Status: DC

## 2021-03-05 MED ORDER — TRIAMCINOLONE ACETONIDE 0.5 % EX CREA: 1.0000 "application " | TOPICAL_CREAM | Freq: Two times a day (BID) | CUTANEOUS | 2 refills | Status: DC

## 2021-03-05 NOTE — Patient Instructions (Addendum)
Thank you for coming to the office today.  Your provider would like to you have your annual eye exam. Please contact your current eye doctor or here are some good options for you to contact.   [facility]   Address: [address] [place], [place] [address] Phone: [phone]  Website: [email or web address] [address], [place], [place] [address] Phone: [phone] [email or web address]  [facility]  Address: [place], [place], [place] [address] Phone: [phone]   [facility] [address] [place], [place] [address] Phone: [phone]  [facility] Address: [place], [place], [place] [address]  Phone: [phone]   The rash looks most consistent with eczema, this can flare up and get worse due to a variety of factors (excessive dry skin from bathing/showering, soaps, cold weather / indoor heaters, outdoor exposures).  Use the topical steroid creams twice a day for up to 1 week, maximum duration of use per one flare is 10 to 14 days, then STOP using it and allow skin to recover. Caution with over-use may cause lightening of the skin.   Hydrocortisone on face only and the Triamcinolone / Kenalog on body only.  Tips to reduce Eczema Flares: For baths/showers, limit bathing to every other day if you can (max 1 x daily)  Use a gentle, unscented soap and lukewarm water (hot water is most irritating to skin) Never scrub skin with too much pressure, this causes more irritation. Pat skin dry, then leave it slightly damp. DO NOT scrub it dry. Apply steroid cream to skin and rub in all the way, wait 15 min, then apply a daily moisturizer (Vaseline, Eucerin, Aveeno, Lubriderm). Continue daily moisturizer every day of the year (even after flare is resolved) - If you have eczema on hands or dry hands, recommend wearing any type of gloves overnight (cloth, fabric, or even nitrile/latex) to improve effect of topical  moisturizer  If develops redness, honey colored crust oozing, drainage of pus, bleeding, or redness / swelling, pain, please return for re-evaluation, may have become infected after scratching.   Please schedule a Follow-up Appointment to: Return in about 3 months (around 06/05/2021) for 3 month DM A1c.  If you have any other questions or concerns, please feel free to call the office or send a message through [place]. You may also schedule an earlier appointment if necessary.  Additionally, you may be receiving a survey about your experience at our office within a few days to 1 week by e-mail or mail. We value your feedback.  Nobie Putnam, DO [place]

## 2021-03-05 NOTE — Progress Notes (Signed)
Subjective:    Patient ID: Devin Mayer, male    DOB: 31-Mar-1956, 65 y.o.   MRN: [id number]  Devin Mayer is a 65 y.o. male presenting on 03/05/2021 for Establish Care  Here to establish care. 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  CHRONIC DM, Type 2: Meds: Lantus 12 units daily, Januvia 50mg  daily, Metformin XR 500mg  daily in PM Reports good compliance. Tolerating well w/o side-effects Not on ACEi ARB Denies hypoglycemia, polyuria, visual changes, numbness or tingling.  CHRONIC HTN: Reports  Current Meds - None.   Denies CP, dyspnea, HA, edema, dizziness / lightheadedness  Health Maintenance: Due for Flu Shot, will receive today    Depression screen [facility] 2/9 03/05/2021  Decreased Interest 3  Down, Depressed, Hopeless 0  PHQ - 2 Score 3  Altered sleeping 1  Tired, decreased energy 3  Change in appetite 3  Feeling bad or failure about yourself  0  Trouble concentrating 0  Moving slowly or fidgety/restless 3  Suicidal thoughts 0  PHQ-9 Score 13  Difficult doing work/chores Very difficult   GAD 7 : Generalized Anxiety Score 03/05/2021  Nervous, Anxious, on Edge 0  Control/stop worrying 0  Worry too much - different things 0  Trouble relaxing 3  Restless 3  Easily annoyed or irritable 3  Afraid - awful might happen 0  Total GAD 7 Score 9  Anxiety Difficulty Somewhat difficult       Past Medical History:  Diagnosis Date   Cancer ([place])    COPD (chronic obstructive pulmonary disease) ([place])    Diabetes mellitus without complication ([place])    Hyperlipidemia    Hypertension    Stroke [facility])    Past Surgical History:  Procedure Laterality Date   CHOLECYSTECTOMY     SHOULDER SURGERY Bilateral    Social History   Socioeconomic History   Marital  status: Married    Spouse name: Not on file   Number of children: Not on file   Years of education: Not on file   Highest education level: Not on file  Occupational History   Not on file  Tobacco Use   Smoking status: Every Day    Types: Cigars   Smokeless tobacco: Never  Substance and Sexual Activity   Alcohol use: Yes   Drug use: Never   Sexual activity: Not on file  Other Topics Concern   Not on file  Social History Narrative   Not on file   Social Determinants of Health   Financial Resource Strain: Not on file  Food Insecurity: Not on file  Transportation Needs: Not on file  Physical Activity: Not on file  Stress: Not on file  Social Connections: Not on file  Intimate Partner Violence: Not on file   Family History  Problem Relation Age of Onset   Heart attack Father    Diabetes Father    Heart attack Paternal Uncle    Lung cancer Maternal Grandmother    Lung cancer Paternal Grandmother    Current Outpatient Medications on File Prior to Visit  Medication Sig   aspirin EC 81 MG tablet Take 81 mg by mouth daily. Swallow whole.   Aspirin-Caffeine (BAYER BACK & BODY) 500-32.5 MG TABS Take by mouth.   famotidine (PEPCID) 20 MG tablet TAKE 1 TABLET (20 MG  TOTAL) BY MOUTH TWO (2) TIMES A DAY.   insulin glargine (LANTUS SOLOSTAR) 100 UNIT/ML Solostar Pen Inject 12 Units into the skin daily.   JANUVIA 50 MG tablet Take 1 tablet (50 mg total) by mouth daily.   No current facility-administered medications on file prior to visit.    Review of Systems Per HPI unless specifically indicated above      Objective:    BP 126/74   Pulse 91   Ht 5\' 6"  (1.676 m)   Wt 175 lb 6.4 oz (79.6 kg)   SpO2 98%   BMI 28.31 kg/m   Wt Readings from Last 3 Encounters:  03/05/21 175 lb 6.4 oz (79.6 kg)  03/04/21 175 lb 8 oz (79.6 kg)  07/26/20 176 lb (79.8 kg)    Physical Exam Vitals and nursing note reviewed.  Constitutional:      General: He is not in acute distress.     Appearance: Normal appearance. He is well-developed. He is not diaphoretic.     Comments: Well-appearing, comfortable, cooperative  HENT:     Head: Normocephalic and atraumatic.  Eyes:     General:        Right eye: No discharge.        Left eye: No discharge.     Conjunctiva/sclera: Conjunctivae normal.  Neck:     Thyroid: No thyromegaly.  Cardiovascular:     Rate and Rhythm: Normal rate and regular rhythm.     Pulses: Normal pulses.     Heart sounds: Normal heart sounds. No murmur heard. Pulmonary:     Effort: Pulmonary effort is normal. No respiratory distress.     Breath sounds: Normal breath sounds. No wheezing or rales.  Musculoskeletal:        General: Normal range of motion.     Cervical back: Normal range of motion and neck supple.  Lymphadenopathy:     Cervical: No cervical adenopathy.  Skin:    General: Skin is warm and dry.     Findings: No erythema or rash.  Neurological:     Mental Status: He is alert and oriented to person, place, and time. Mental status is at baseline.  Psychiatric:        Mood and Affect: Mood normal.        Behavior: Behavior normal.        Thought Content: Thought content normal.     Comments: Well groomed, good eye contact, normal speech and thoughts    Diabetic Foot Exam - Simple   Simple Foot Form Diabetic Foot exam was performed with the following findings: Yes 03/05/2021 11:00 AM  Visual Inspection See comments: Yes Sensation Testing Intact to touch and monofilament testing bilaterally: Yes Pulse Check Posterior Tibialis and Dorsalis pulse intact bilaterally: Yes Comments Dry skin with callus formation. No ulceration. Intact sensation to monofilament.      Results for orders placed or performed in visit on 03/05/21  Hemoglobin A1c  Result Value Ref Range   Hemoglobin A1C 7.8       Assessment & Plan:   Problem List Items Addressed This Visit     Type 2 diabetes mellitus with diabetic neuropathy, with long-term current  use of insulin (HCC)   Relevant Medications   JANUVIA 50 MG tablet   insulin glargine (LANTUS SOLOSTAR) 100 UNIT/ML Solostar Pen   metFORMIN (GLUCOPHAGE-XR) 500 MG 24 hr tablet   Other Visit Diagnoses     Dyshidrotic eczema    -  Primary   Relevant  Medications   triamcinolone cream (KENALOG) 0.5 %   Encounter to establish care with new doctor       Needs flu shot       Relevant Orders   Flu Vaccine QUAD 21mo+IM (Fluarix, Fluzone & Alfiuria Quad PF) (Completed)       Request outside records.  Dyshidrotic Eczema Hands Rx Triamcinolone BID 2 weeks, moisturizers use per AVS Consider topical antifungal if area of concern.  T2DM Review outside records and prior A1c Continue current meds Reorder insulin metformin continue dosage  Chronic Pain Chest Wall / Neuralgia Followed by Pain Management / Neuro  Meds ordered this encounter  Medications   triamcinolone cream (KENALOG) 0.5 %    Sig: Apply 1 application topically 2 (two) times daily. To affected areas, for up to 2 weeks.    Dispense:  30 g    Refill:  2   metFORMIN (GLUCOPHAGE-XR) 500 MG 24 hr tablet    Sig: Take 1 tablet (500 mg total) by mouth daily with supper.    Dispense:  90 tablet    Refill:  3     Follow up plan: Return in about 3 months (around 06/05/2021) for 3 month DM A1c.  Nobie Putnam, DO [facility] 03/05/2021, 10:45 AM

## 2021-03-17 ENCOUNTER — Ambulatory Visit
Admission: RE | Admit: 2021-03-17 | Discharge: 2021-03-17 | Disposition: A | Discharge status: Home / Self Care | Payer: 59 | Source: Ambulatory Visit | Attending: Student in an Organized Health Care Education/Training Program | Admitting: Student in an Organized Health Care Education/Training Program

## 2021-03-17 ENCOUNTER — Other Ambulatory Visit: Payer: Self-pay

## 2021-03-17 ENCOUNTER — Ambulatory Visit (HOSPITAL_BASED_OUTPATIENT_CLINIC_OR_DEPARTMENT_OTHER): Payer: 59 | Admitting: Student in an Organized Health Care Education/Training Program

## 2021-03-17 ENCOUNTER — Encounter: Payer: Self-pay | Admitting: Student in an Organized Health Care Education/Training Program

## 2021-03-17 DIAGNOSIS — M5416 Radiculopathy, lumbar region: Secondary | ICD-10-CM

## 2021-03-17 DIAGNOSIS — G8929 Other chronic pain: Secondary | ICD-10-CM | POA: Insufficient documentation

## 2021-03-17 DIAGNOSIS — G894 Chronic pain syndrome: Secondary | ICD-10-CM | POA: Insufficient documentation

## 2021-03-17 MED ORDER — SODIUM CHLORIDE 0.9% FLUSH
2.0000 mL | Freq: Once | INTRAVENOUS | Status: AC
  Administered 2021-03-17: 10 mL

## 2021-03-17 MED ORDER — IOHEXOL 180 MG/ML  SOLN
10.0000 mL | Freq: Once | INTRAMUSCULAR | Status: AC
  Administered 2021-03-17: 10 mL via EPIDURAL
  Filled 2021-03-17: qty 20

## 2021-03-17 MED ORDER — ROPIVACAINE HCL 2 MG/ML IJ SOLN
2.0000 mL | Freq: Once | INTRAMUSCULAR | Status: AC
  Administered 2021-03-17: 20 mL via EPIDURAL

## 2021-03-17 MED ORDER — LIDOCAINE HCL 2 % IJ SOLN
20.0000 mL | Freq: Once | INTRAMUSCULAR | Status: AC
  Administered 2021-03-17: 200 mg
  Filled 2021-03-17: qty 10

## 2021-03-17 MED ORDER — DEXAMETHASONE SODIUM PHOSPHATE 10 MG/ML IJ SOLN
10.0000 mg | Freq: Once | INTRAMUSCULAR | Status: AC
  Administered 2021-03-17: 10 mg
  Filled 2021-03-17: qty 1

## 2021-03-17 NOTE — Progress Notes (Signed)
Safety precautions to be maintained throughout the outpatient stay will include: orient to surroundings, keep bed in low position, maintain call bell within reach at all times, provide assistance with transfer out of bed and ambulation.  

## 2021-03-17 NOTE — Patient Instructions (Signed)

## 2021-03-17 NOTE — Progress Notes (Signed)
PROVIDER NOTE: Information contained herein reflects review and annotations entered in association with encounter. Interpretation of such information and data should be left to medically-trained personnel. Information provided to patient can be located elsewhere in the medical record under "Patient Instructions". Document created using STT-dictation technology, any transcriptional errors that may result from process are unintentional.    Patient: Devin Mayer  Service Category: Procedure Provider: Gillis Santa, MD DOB: 1955-11-30 DOS: 03/17/2021 Location: [facility] Facility MRN: [id number] Setting: Ambulatory - outpatient Referring Provider: Gillis Santa, MD Type: Established Patient Specialty: Interventional Pain Management PCP: Olin Hauser, DO  Primary Reason for Visit: Interventional Pain Management Treatment. CC: Back Pain (Lumbar bilateral)   Procedure:          Anesthesia, Analgesia, Anxiolysis:  Type: Diagnostic Inter-Laminar Epidural Steroid Injection  #1  Region: Lumbar Level: L5-S1 Level. Laterality: Midline         Anesthesia: Local (1-2% Lidocaine)  Anxiolysis: None  Sedation: None  Guidance: Fluoroscopy           Position: Prone with head of the table was raised to facilitate breathing.   Indications: 1. Chronic radicular lumbar pain   2. Chronic pain syndrome    Pain Score: Pre-procedure: 7 /10 Post-procedure: 2 /10    Pre-op H&P Assessment:  Mr. Blixt is a 65 y.o. (year old), male patient, seen today for interventional treatment. He  has a past surgical history that includes Shoulder surgery (Bilateral) and Cholecystectomy. Mr. Whetsell has a current medication list which includes the following prescription(s): albuterol, aspirin, aspirin ec, bayer back & body, clonazepam, diclofenac, famotidine, lantus solostar, januvia, metformin, nortriptyline, potassium chloride sa, and triamcinolone cream. His primarily concern today is the Back Pain (Lumbar  bilateral)  Initial Vital Signs:  Pulse/HCG Rate: 98ECG Heart Rate: 98 Temp:  (!) 97.2 F (36.2 C) Resp: 16 BP: 137/73 SpO2: 99 %  BMI: Estimated body mass index is 27.76 kg/m as calculated from the following:   Height as of this encounter: 5\' 6"  (1.676 m).   Weight as of this encounter: 172 lb (78 kg).  Risk Assessment: Allergies: Reviewed. He is allergic to latex.  Allergy Precautions: None required Coagulopathies: Reviewed. None identified.  Blood-thinner therapy: None at this time Active Infection(s): Reviewed. None identified. Mr. Catoe is afebrile  Site Confirmation: Mr. Craun was asked to confirm the procedure and laterality before marking the site Procedure checklist: Completed Consent: Before the procedure and under the influence of no sedative(s), amnesic(s), or anxiolytics, the patient was informed of the treatment options, risks and possible complications. To fulfill our ethical and legal obligations, as recommended by the American Medical Association's Code of Ethics, I have informed the patient of my clinical impression; the nature and purpose of the treatment or procedure; the risks, benefits, and possible complications of the intervention; the alternatives, including doing nothing; the risk(s) and benefit(s) of the alternative treatment(s) or procedure(s); and the risk(s) and benefit(s) of doing nothing. The patient was provided information about the general risks and possible complications associated with the procedure. These may include, but are not limited to: failure to achieve desired goals, infection, bleeding, organ or nerve damage, allergic reactions, paralysis, and death. In addition, the patient was informed of those risks and complications associated to Spine-related procedures, such as failure to decrease pain; infection (i.e.: Meningitis, epidural or intraspinal abscess); bleeding (i.e.: epidural hematoma, subarachnoid hemorrhage, or any other type of  intraspinal or peri-dural bleeding); organ or nerve damage (i.e.: Any type of peripheral nerve,  nerve root, or spinal cord injury) with subsequent damage to sensory, motor, and/or autonomic systems, resulting in permanent pain, numbness, and/or weakness of one or several areas of the body; allergic reactions; (i.e.: anaphylactic reaction); and/or death. Furthermore, the patient was informed of those risks and complications associated with the medications. These include, but are not limited to: allergic reactions (i.e.: anaphylactic or anaphylactoid reaction(s)); adrenal axis suppression; blood sugar elevation that in diabetics may result in ketoacidosis or comma; water retention that in patients with history of congestive heart failure may result in shortness of breath, pulmonary edema, and decompensation with resultant heart failure; weight gain; swelling or edema; medication-induced neural toxicity; particulate matter embolism and blood vessel occlusion with resultant organ, and/or nervous system infarction; and/or aseptic necrosis of one or more joints. Finally, the patient was informed that Medicine is not an exact science; therefore, there is also the possibility of unforeseen or unpredictable risks and/or possible complications that may result in a catastrophic outcome. The patient indicated having understood very clearly. We have given the patient no guarantees and we have made no promises. Enough time was given to the patient to ask questions, all of which were answered to the patient's satisfaction. Mr. Bracewell has indicated that he wanted to continue with the procedure. Attestation: I, the ordering provider, attest that I have discussed with the patient the benefits, risks, side-effects, alternatives, likelihood of achieving goals, and potential problems during recovery for the procedure that I have provided informed consent. Date  Time: 03/17/2021 10:49 AM  Pre-Procedure Preparation:  Monitoring:  As per clinic protocol. Respiration, ETCO2, SpO2, BP, heart rate and rhythm monitor placed and checked for adequate function Safety Precautions: Patient was assessed for positional comfort and pressure points before starting the procedure. Time-out: I initiated and conducted the "Time-out" before starting the procedure, as per protocol. The patient was asked to participate by confirming the accuracy of the "Time Out" information. Verification of the correct person, site, and procedure were performed and confirmed by me, the nursing staff, and the patient. "Time-out" conducted as per Joint Commission's Universal Protocol (UP.01.01.01). Time: 1142  Description of Procedure:          Target Area: The interlaminar space, initially targeting the lower laminar border of the superior vertebral body. Approach: Paramedial approach. Area Prepped: Entire Posterior Lumbar Region DuraPrep (Iodine Povacrylex [0.7% available iodine] and Isopropyl Alcohol, 74% w/w) Safety Precautions: Aspiration looking for blood return was conducted prior to all injections. At no point did we inject any substances, as a needle was being advanced. No attempts were made at seeking any paresthesias. Safe injection practices and needle disposal techniques used. Medications properly checked for expiration dates. SDV (single dose vial) medications used. Description of the Procedure: Protocol guidelines were followed. The procedure needle was introduced through the skin, ipsilateral to the reported pain, and advanced to the target area. Bone was contacted and the needle walked caudad, until the lamina was cleared. The epidural space was identified using "loss-of-resistance technique" with 2-3 ml of PF-NaCl (0.9% NSS), in a 5cc LOR glass syringe.  Vitals:   03/17/21 1055 03/17/21 1145 03/17/21 1148  BP: 137/73 133/80 137/82  Pulse: 98    Resp: 16 20 20   Temp: (!) 97.2 F (36.2 C)    TempSrc: Temporal    SpO2: 99% 92% 92%  Weight: 172  lb (78 kg)    Height: 5\' 6"  (1.676 m)      Start Time: 1142 hrs. End Time: 1147 hrs.  Materials:  Needle(s) Type: Epidural needle Gauge: 22G Length: 3.5-in Medication(s): Please see orders for medications and dosing details. 6 cc solution made of 3 cc of preservative-free saline, 2 cc of 0.2% ropivacaine, 1 cc of Decadron 10 mg/cc.  Imaging Guidance (Spinal):          Type of Imaging Technique: Fluoroscopy Guidance (Spinal) Indication(s): Assistance in needle guidance and placement for procedures requiring needle placement in or near specific anatomical locations not easily accessible without such assistance. Exposure Time: Please see nurses notes. Contrast: Before injecting any contrast, we confirmed that the patient did not have an allergy to iodine, shellfish, or radiological contrast. Once satisfactory needle placement was completed at the desired level, radiological contrast was injected. Contrast injected under live fluoroscopy. No contrast complications. See chart for type and volume of contrast used. Fluoroscopic Guidance: I was personally present during the use of fluoroscopy. "Tunnel Vision Technique" used to obtain the best possible view of the target area. Parallax error corrected before commencing the procedure. "Direction-depth-direction" technique used to introduce the needle under continuous pulsed fluoroscopy. Once target was reached, antero-posterior, oblique, and lateral fluoroscopic projection used confirm needle placement in all planes. Images permanently stored in EMR. Interpretation: I personally interpreted the imaging intraoperatively. Adequate needle placement confirmed in multiple planes. Appropriate spread of contrast into desired area was observed. No evidence of afferent or efferent intravascular uptake. No intrathecal or subarachnoid spread observed. Permanent images saved into the patient's record.   Post-operative Assessment:  Post-procedure Vital Signs:   Pulse/HCG Rate: 9896 Temp:  (!) 97.2 F (36.2 C) Resp: 20 BP: 137/82 SpO2: 92 %  EBL: None  Complications: No immediate post-treatment complications observed by team, or reported by patient.  Note: The patient tolerated the entire procedure well. A repeat set of vitals were taken after the procedure and the patient was kept under observation following institutional policy, for this type of procedure. Post-procedural neurological assessment was performed, showing return to baseline, prior to discharge. The patient was provided with post-procedure discharge instructions, including a section on how to identify potential problems. Should any problems arise concerning this procedure, the patient was given instructions to immediately contact us, at any time, without hesitation. In any case, we plan to contact the patient by telephone for a follow-up status report regarding this interventional procedure.  Comments:  No additional relevant information.  5 out of 5 strength bilateral lower extremity: Plantar flexion, dorsiflexion, knee flexion, knee extension.   Plan of Care  Orders:  Orders Placed This Encounter  Procedures   DG PAIN CLINIC C-ARM 1-60 MIN NO REPORT    Intraoperative interpretation by procedural physician at [place].    Standing Status:   Standing    Number of Occurrences:   1    Order Specific Question:   Reason for exam:    Answer:   Assistance in needle guidance and placement for procedures requiring needle placement in or near specific anatomical locations not easily accessible without such assistance.    Medications ordered for procedure: Meds ordered this encounter  Medications   iohexol (OMNIPAQUE) 180 MG/ML injection 10 mL    Must be Myelogram-compatible. If not available, you may substitute with a water-soluble, non-ionic, hypoallergenic, myelogram-compatible radiological contrast medium.   lidocaine (XYLOCAINE) 2 % (with pres) injection 400 mg    sodium chloride flush (NS) 0.9 % injection 2 mL   ropivacaine (PF) 2 mg/mL (0.2%) (NAROPIN) injection 2 mL   dexamethasone (DECADRON) injection 10 mg   Medications administered:  We administered iohexol, lidocaine, sodium chloride flush, ropivacaine (PF) 2 mg/mL (0.2%), and dexamethasone.  See the medical record for exact dosing, route, and time of administration.  Follow-up plan:   Return in about 4 weeks (around 04/14/2021) for Post Procedure Evaluation, virtual.     L5/S1 ESI 03/17/21  Recent Visits Date Type Provider Dept  03/04/21 Office Visit Gillis Santa, MD [facility]  Showing recent visits within past 90 days and meeting all other requirements Today's Visits Date Type Provider Dept  03/17/21 Procedure visit Gillis Santa, MD [facility]  Showing today's visits and meeting all other requirements Future Appointments Date Type Provider Dept  04/14/21 Appointment Gillis Santa, MD [facility]  Showing future appointments within next 90 days and meeting all other requirements Disposition: Discharge home  Discharge (Date  Time): 03/17/2021; 1200 hrs.   Primary Care Physician: Olin Hauser, DO Location: [facility] Note by: Gillis Santa, MD Date: 03/17/2021; Time: 12:09 PM  Disclaimer:  Medicine is not an exact science. The only guarantee in medicine is that nothing is guaranteed. It is important to note that the decision to proceed with this intervention was based on the information collected from the patient. The Data and conclusions were drawn from the patient's questionnaire, the interview, and the physical examination. Because the information was provided in large part by the patient, it cannot be guaranteed that it has not been purposely or unconsciously manipulated. Every effort has been made to obtain as much relevant data as possible for this evaluation. It is important to note that the conclusions  that lead to this procedure are derived in large part from the available data. Always take into account that the treatment will also be dependent on availability of resources and existing treatment guidelines, considered by other Pain Management Practitioners as being common knowledge and practice, at the time of the intervention. For Medico-Legal purposes, it is also important to point out that variation in procedural techniques and pharmacological choices are the acceptable norm. The indications, contraindications, technique, and results of the above procedure should only be interpreted and judged by a Board-Certified Interventional Pain Specialist with extensive familiarity and expertise in the same exact procedure and technique.

## 2021-03-18 ENCOUNTER — Telehealth: Payer: Self-pay

## 2021-03-18 NOTE — Telephone Encounter (Signed)
Post procedure phone call.  Lm 

## 2021-03-21 ENCOUNTER — Encounter: Payer: Self-pay | Admitting: Student in an Organized Health Care Education/Training Program

## 2021-03-28 ENCOUNTER — Encounter: Payer: Self-pay | Admitting: Student in an Organized Health Care Education/Training Program

## 2021-03-31 ENCOUNTER — Other Ambulatory Visit: Payer: Self-pay | Admitting: Student in an Organized Health Care Education/Training Program

## 2021-03-31 ENCOUNTER — Encounter: Payer: Self-pay | Admitting: Student in an Organized Health Care Education/Training Program

## 2021-03-31 DIAGNOSIS — G8929 Other chronic pain: Secondary | ICD-10-CM

## 2021-03-31 MED ORDER — PREDNISONE 20 MG PO TABS: ORAL_TABLET | ORAL | 0 refills | Status: AC

## 2021-04-14 ENCOUNTER — Other Ambulatory Visit: Payer: Self-pay

## 2021-04-14 ENCOUNTER — Ambulatory Visit
Payer: 59 | Attending: Student in an Organized Health Care Education/Training Program | Admitting: Student in an Organized Health Care Education/Training Program

## 2021-04-14 DIAGNOSIS — M5416 Radiculopathy, lumbar region: Secondary | ICD-10-CM | POA: Diagnosis not present

## 2021-04-14 DIAGNOSIS — G8929 Other chronic pain: Secondary | ICD-10-CM | POA: Diagnosis not present

## 2021-04-14 DIAGNOSIS — M5136 Other intervertebral disc degeneration, lumbar region: Secondary | ICD-10-CM | POA: Diagnosis not present

## 2021-04-14 DIAGNOSIS — G894 Chronic pain syndrome: Secondary | ICD-10-CM | POA: Diagnosis not present

## 2021-04-14 NOTE — Progress Notes (Signed)
Patient: Devin Mayer  Service Category: E/M  Provider: Gillis Santa, MD  DOB: 09-11-1955  DOS: 04/14/2021  Location: Office  MRN: [id number]  Setting: Ambulatory outpatient  Referring Provider: Nobie Putnam *  Type: Established Patient  Specialty: Interventional Pain Management  PCP: Olin Hauser, DO  Location: Remote location  Delivery: TeleHealth     Virtual Encounter - Pain Management PROVIDER NOTE: Information contained herein reflects review and annotations entered in association with encounter. Interpretation of such information and data should be left to medically-trained personnel. Information provided to patient can be located elsewhere in the medical record under "Patient Instructions". Document created using STT-dictation technology, any transcriptional errors that may result from process are unintentional.    Contact & Pharmacy Preferred: [phone] Home: [phone] (home) Mobile: [phone] (mobile) E-mail: [email or web address]  [id number] [place] [place] [place]Phone: [phone]: [phone]  Pre-screening  Mr. TLovena Leoffered "in-person" vs "virtual" encounter. He indicated preferring virtual for this encounter.   Reason COVID-19*  Social distancing based on CDC and AMA recommendations.   I contacted SHolman Bonsignoreon 04/14/2021 via telephone.      I clearly identified myself as BGillis Santa MD. I verified that I was speaking with the correct person using two identifiers (Name: SMickel Schreur and date of birth: 521-Feb-1957.  Consent I sought verbal advanced consent from SMarlene Bastfor virtual visit interactions. I informed Mr. TGildnerof possible security and privacy concerns, risks, and limitations associated with providing "not-in-person" medical evaluation and management services. I also informed Mr. TBerishaof the availability of "in-person" appointments. Finally, I informed him  that there would be a charge for the virtual visit and that he could be  personally, fully or partially, financially responsible for it. Mr. TKlueverexpressed understanding and agreed to proceed.   Historic Elements   Mr. SAvelino Herrenis a 65y.o. year old, male patient evaluated today after our last contact on 03/17/2021. Mr. TFails has a past medical history of Cancer ([facility], COPD (chronic obstructive pulmonary disease) ([place], Diabetes mellitus without complication ([place], Hyperlipidemia, Hypertension, and Stroke ([place]. He also  has a past surgical history that includes Shoulder surgery (Bilateral) and Cholecystectomy. Mr. TVitellihas a current medication list which includes the following prescription(s): albuterol, aspirin, aspirin ec, bayer back & body, clonazepam, diclofenac, famotidine, lantus solostar, januvia, metformin, nortriptyline, potassium chloride sa, and triamcinolone cream. He  reports that he has been smoking cigars. He has never used smokeless tobacco. He reports current alcohol use. He reports that he does not use drugs. Mr. TDenhamis allergic to latex.   HPI  Today, he is being contacted for a post-procedure assessment.    Post-Procedure Evaluation  Procedure (03/17/2021):  Procedure:          Anesthesia, Analgesia, Anxiolysis:  Type: Diagnostic Inter-Laminar Epidural Steroid Injection  #1  Region: Lumbar Level: L5-S1 Level. Laterality: Midline         Anesthesia: Local (1-2% Lidocaine)  Anxiolysis: None  Sedation: None  Guidance: Fluoroscopy           Position: Prone with head of the table was raised to facilitate breathing.   Indications: 1. Chronic radicular lumbar pain   2. Chronic pain syndrome    Pain Score: Pre-procedure: 7 /10 Post-procedure: 2 /10     Anxiolysis: Please see nurses note.  Effectiveness during initial hour after procedure (Ultra-Short Term Relief): 100 %   Local anesthetic used: Long-acting (  4-6 hours) Effectiveness: Defined as any  analgesic benefit obtained secondary to the administration of local anesthetics. This carries significant diagnostic value as to the etiological location, or anatomical origin, of the pain. Duration of benefit is expected to coincide with the duration of the local anesthetic used.  Effectiveness during initial 4-6 hours after procedure (Short-Term Relief): 100 %   Long-term benefit: Defined as any relief past the pharmacologic duration of the local anesthetics.  Effectiveness past the initial 6 hours after procedure (Long-Term Relief): 50 %   Benefits, current: Defined as benefit present at the time of this evaluation.   Analgesia: 5-10% Function: Mr. Willetts reports improvement in function    Laboratory Chemistry Profile   Renal Lab Results  Component Value Date   BUN 17 12/19/2016   CREATININE 1.10 12/19/2016   GFRAA >60 12/19/2016   GFRNONAA >60 12/19/2016    Hepatic Lab Results  Component Value Date   AST 28 12/19/2016   ALT 31 12/19/2016   ALBUMIN 4.0 12/19/2016   ALKPHOS 83 12/19/2016   LIPASE 33 12/19/2016    Electrolytes Lab Results  Component Value Date   NA 138 12/19/2016   K 3.9 12/19/2016   CL 106 12/19/2016   CALCIUM 9.3 12/19/2016    Bone No results found for: VD25OH, VD125OH2TOT, HT3428JG8, TL5726OM3, 25OHVITD1, 25OHVITD2, 25OHVITD3, TESTOFREE, TESTOSTERONE  Inflammation (CRP: Acute Phase) (ESR: Chronic Phase) Lab Results  Component Value Date   CRP 2.3 (H) 07/10/2020   ESRSEDRATE 7 07/10/2020         Note: Above Lab results reviewed.   Assessment  The primary encounter diagnosis was Chronic radicular lumbar pain. Diagnoses of Chronic pain syndrome and Other intervertebral disc degeneration, lumbar region were also pertinent to this visit.  Plan of Care   Limited response with epidural.  In fact he had increased pain for a little bit afterwards and he was prescribed a prednisone taper which was helpful.  At this point, I would like to obtain a  lumbar MRI to gather additional information and hopefully develop a treatment plan after lumbar MRI.  Orders:  Orders Placed This Encounter  Procedures   MR LUMBAR SPINE WO CONTRAST    Patient presents with axial pain with possible radicular component. Please assist [place] in identifying specific level(s) and laterality of any additional findings such as: 1. Facet (Zygapophyseal) joint DJD (Hypertrophy, space narrowing, subchondral sclerosis, and/or osteophyte formation) 2. DDD and/or IVDD (Loss of disc height, desiccation, gas patterns, osteophytes, endplate sclerosis, or "Black disc disease") 3. Pars defects 4. Spondylolisthesis, spondylosis, and/or spondyloarthropathies (include Degree/Grade of displacement in mm) (stability) 5. Vertebral body Fractures (acute/chronic) (state percentage of collapse) 6. Demineralization (osteopenia/osteoporotic) 7. Bone pathology 8. Foraminal narrowing  9. Surgical changes 10. Central, Lateral Recess, and/or Foraminal Stenosis (include AP diameter of stenosis in mm) 11. Surgical changes (hardware type, status, and presence of fibrosis) 12. Modic Type Changes (MRI only) 13. IVDD (Disc bulge, protrusion, herniation, extrusion) (Level, laterality, extent)    Standing Status:   Future    Standing Expiration Date:   05/14/2021    Scheduling Instructions:     Imaging must be done as soon as possible. Inform patient that order will expire within 30 days and I will not renew it.    Order Specific Question:   What is the patient's sedation requirement?    Answer:   No Sedation    Order Specific Question:   Does the patient have a pacemaker or implanted devices?  Answer:   No    Order Specific Question:   Preferred imaging location?    Answer:   [facility] (table limit-350lbs)    Order Specific Question:   Call Results- Best Contact Number?    Answer:   [phone] ([facility])    Order Specific Question:   Radiology Contrast Protocol - do  NOT remove file path    Answer:   [email or web address]    Follow-up plan:   Return for I will call patient with MRI results.     L5/S1 ESI 03/17/21   Recent Visits Date Type Provider Dept  03/17/21 Procedure visit Gillis Santa, MD [facility]  03/04/21 Office Visit Gillis Santa, MD [facility]  Showing recent visits within past 90 days and meeting all other requirements Today's Visits Date Type Provider Dept  04/14/21 Office Visit Gillis Santa, MD [facility]  Showing today's visits and meeting all other requirements Future Appointments No visits were found meeting these conditions. Showing future appointments within next 90 days and meeting all other requirements I discussed the assessment and treatment plan with the patient. The patient was provided an opportunity to ask questions and all were answered. The patient agreed with the plan and demonstrated an understanding of the instructions.  Patient advised to call back or seek an in-person evaluation if the symptoms or condition worsens.  Duration of encounter:38mnutes.  Note by: BGillis Santa MD Date: 04/14/2021; Time: 3:35 PM

## 2021-04-15 ENCOUNTER — Encounter: Payer: Self-pay | Admitting: Student in an Organized Health Care Education/Training Program

## 2021-04-15 NOTE — Telephone Encounter (Signed)
Message sent to Dr. Holley Raring to advise.

## 2021-04-30 ENCOUNTER — Ambulatory Visit
Admission: RE | Admit: 2021-04-30 | Discharge: 2021-04-30 | Disposition: A | Discharge status: Home / Self Care | Payer: 59 | Source: Ambulatory Visit | Attending: Student in an Organized Health Care Education/Training Program | Admitting: Student in an Organized Health Care Education/Training Program

## 2021-04-30 DIAGNOSIS — G8929 Other chronic pain: Secondary | ICD-10-CM | POA: Insufficient documentation

## 2021-04-30 DIAGNOSIS — M5136 Other intervertebral disc degeneration, lumbar region: Secondary | ICD-10-CM | POA: Insufficient documentation

## 2021-04-30 DIAGNOSIS — M5416 Radiculopathy, lumbar region: Secondary | ICD-10-CM | POA: Insufficient documentation

## 2021-05-01 ENCOUNTER — Telehealth: Payer: Self-pay | Admitting: *Deleted

## 2021-05-01 ENCOUNTER — Other Ambulatory Visit: Payer: Self-pay | Admitting: Student in an Organized Health Care Education/Training Program

## 2021-05-01 DIAGNOSIS — M5416 Radiculopathy, lumbar region: Secondary | ICD-10-CM

## 2021-05-01 DIAGNOSIS — M48061 Spinal stenosis, lumbar region without neurogenic claudication: Secondary | ICD-10-CM

## 2021-05-01 DIAGNOSIS — G894 Chronic pain syndrome: Secondary | ICD-10-CM

## 2021-05-01 DIAGNOSIS — G8929 Other chronic pain: Secondary | ICD-10-CM

## 2021-05-01 NOTE — Progress Notes (Unsigned)
Narrative & Impression  CLINICAL DATA:  Osteoarthritis, lumbosacral; lumbar radiculopathy. Central, left low back and hip pain.   EXAM: MRI LUMBAR SPINE WITHOUT CONTRAST   TECHNIQUE: Multiplanar, multisequence MR imaging of the lumbar spine was performed. No intravenous contrast was administered.   COMPARISON:  CT abdomen pelvis 04/13/2013.   FINDINGS: Segmentation: Standard.   Alignment: Physiologic.   Vertebrae: No fracture, evidence of discitis, or bone lesion.   Conus medullaris and cauda equina: Conus extends to the T12-L1 level. Conus and cauda equina appear normal.   Paraspinal and other soft tissues: Stable right adrenal adenoma. Partially visualized cystic structures within the right kidney. No acute findings.   Disc levels:   T12-L1: Unremarkable.   L1-L2: Unremarkable.   L2-L3: Mild disc desiccation with minimal annular disc bulge. No foraminal or canal stenosis.   L3-L4: Mild disc desiccation with minimal annular disc bulge and shallow right extraforaminal disc protrusion with annular fissure. Slight mass effect upon the exited right L3 nerve root. Slight bilateral foraminal narrowing, left slightly worse than right. No canal stenosis.   L4-L5: Mild diffuse disc bulge with right foraminal protrusion with annular fissure. Mild bilateral facet hypertrophy. Mild right foraminal stenosis. No canal stenosis.   L5-S1: Unremarkable.   IMPRESSION: 1. Mild lumbar spondylosis as described above. 2. At L3-L4, a shallow right extraforaminal disc protrusion with annular fissure results in slight mass effect upon the exited right L3 nerve root. Slight bilateral foraminal narrowing. 3. At L4-L5, a right foraminal disc protrusion with annular fissure contributes to mild right foraminal stenosis. 4. No canal stenosis at any level.     Electronically Signed   By: Davina Poke D.O.   On: 05/01/2021 10:50      Recommend right L3-L4 and L4-5 transforaminal  epidural steroid injection.

## 2021-05-01 NOTE — Telephone Encounter (Signed)
Voicemail left with patient that BL has reviewed his recent MRI and recommends a right transforaminol epidural, w or w/o sedation.  Asked to scheduling for appt.  If he has any further questions or needs instructions of how to prepare, ask to speak with a nurse.

## 2021-05-12 ENCOUNTER — Ambulatory Visit
Admission: RE | Admit: 2021-05-12 | Discharge: 2021-05-12 | Disposition: A | Discharge status: Home / Self Care | Payer: 59 | Source: Ambulatory Visit | Attending: Student in an Organized Health Care Education/Training Program | Admitting: Student in an Organized Health Care Education/Training Program

## 2021-05-12 ENCOUNTER — Other Ambulatory Visit: Payer: Self-pay

## 2021-05-12 ENCOUNTER — Encounter: Payer: Self-pay | Admitting: Student in an Organized Health Care Education/Training Program

## 2021-05-12 ENCOUNTER — Ambulatory Visit (HOSPITAL_BASED_OUTPATIENT_CLINIC_OR_DEPARTMENT_OTHER): Payer: 59 | Admitting: Student in an Organized Health Care Education/Training Program

## 2021-05-12 DIAGNOSIS — G8929 Other chronic pain: Secondary | ICD-10-CM | POA: Diagnosis not present

## 2021-05-12 DIAGNOSIS — M48061 Spinal stenosis, lumbar region without neurogenic claudication: Secondary | ICD-10-CM | POA: Insufficient documentation

## 2021-05-12 DIAGNOSIS — M5416 Radiculopathy, lumbar region: Secondary | ICD-10-CM

## 2021-05-12 DIAGNOSIS — G894 Chronic pain syndrome: Secondary | ICD-10-CM | POA: Diagnosis not present

## 2021-05-12 MED ORDER — IOHEXOL 180 MG/ML  SOLN
10.0000 mL | Freq: Once | INTRAMUSCULAR | Status: AC
  Administered 2021-05-12: 09:00:00 5 mL via EPIDURAL

## 2021-05-12 MED ORDER — LIDOCAINE HCL 2 % IJ SOLN
20.0000 mL | Freq: Once | INTRAMUSCULAR | Status: AC
  Administered 2021-05-12: 09:00:00 200 mg
  Filled 2021-05-12: qty 20

## 2021-05-12 MED ORDER — DEXAMETHASONE SODIUM PHOSPHATE 10 MG/ML IJ SOLN
10.0000 mg | Freq: Once | INTRAMUSCULAR | Status: AC
  Administered 2021-05-12: 09:00:00 10 mg
  Filled 2021-05-12: qty 1

## 2021-05-12 MED ORDER — SODIUM CHLORIDE 0.9% FLUSH
1.0000 mL | Freq: Once | INTRAVENOUS | Status: AC
  Administered 2021-05-12: 09:00:00 1 mL

## 2021-05-12 MED ORDER — SODIUM CHLORIDE (PF) 0.9 % IJ SOLN
INTRAMUSCULAR | Status: AC
  Filled 2021-05-12: qty 10

## 2021-05-12 MED ORDER — ROPIVACAINE HCL 2 MG/ML IJ SOLN
1.0000 mL | Freq: Once | INTRAMUSCULAR | Status: AC
  Administered 2021-05-12: 09:00:00 1 mL via EPIDURAL

## 2021-05-12 MED ORDER — MIDAZOLAM HCL 5 MG/5ML IJ SOLN
0.5000 mg | Freq: Once | INTRAMUSCULAR | Status: AC
  Administered 2021-05-12: 09:00:00 1.5 mg via INTRAVENOUS
  Filled 2021-05-12: qty 5

## 2021-05-12 MED ORDER — ROPIVACAINE HCL 2 MG/ML IJ SOLN
INTRAMUSCULAR | Status: AC
  Filled 2021-05-12: qty 20

## 2021-05-12 NOTE — Progress Notes (Signed)
Safety precautions to be maintained throughout the outpatient stay will include: orient to surroundings, keep bed in low position, maintain call bell within reach at all times, provide assistance with transfer out of bed and ambulation.  

## 2021-05-12 NOTE — Progress Notes (Signed)
PROVIDER NOTE: Information contained herein reflects review and annotations entered in association with encounter. Interpretation of such information and data should be left to medically-trained personnel. Information provided to patient can be located elsewhere in the medical record under "Patient Instructions". Document created using STT-dictation technology, any transcriptional errors that may result from process are unintentional.    Patient: Devin Mayer  Service Category: Procedure Provider: Gillis Santa, MD DOB: [birth year] DOS: 05/12/2021 Location: [facility] MRN: [id number] Setting: Ambulatory - outpatient Referring Provider: Gillis Santa, MD Type: Established Patient Specialty: Interventional Pain Management PCP: Olin Hauser, DO  Primary Reason for Visit: Interventional Pain Management Treatment. CC: Back Pain (Lower)    Procedure:          Anesthesia, Analgesia, Anxiolysis:  Type: Trans-Foraminal Epidural Steroid Injection #1  Purpose: Diagnostic/Therapeutic Region: Posterolateral Lumbosacral Target Area: The 6 o'clock position under the pedicle, on the affected side. Approach: Posterior Percutaneous Paravertebral approach. Level: L3 Level Laterality: Right-Sided       Anesthesia: Local (1-2% Lidocaine)  Anxiolysis: IV Versed bolus 1.5 mg  Sedation: None  Guidance: Fluoroscopy           Position: Prone   Indications: 1. 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 Chronic radicular lumbar pain   3. Chronic pain syndrome    Pain Score: Pre-procedure: 8 /10 Post-procedure: 0-No pain/10     Pre-op H&P Assessment:  Devin Mayer is a 65 y.o. (year old), male patient, seen today for interventional treatment. He  has a past surgical history that includes Shoulder surgery (Bilateral) and Cholecystectomy. Devin Mayer has a current medication list which includes the following prescription(s): albuterol, aspirin, [facility] back &  body, clonazepam, diclofenac, famotidine, lantus solostar, januvia, metformin, nortriptyline, potassium chloride sa, triamcinolone cream, and aspirin ec. His primarily concern today is the Back Pain (Lower)  Initial Vital Signs:  Pulse/HCG Rate: 98ECG Heart Rate: 94 Temp:  (!) 97.2 F (36.2 C) Resp: 16 BP: 134/77 SpO2: 98 %  BMI: Estimated body mass index is 28.08 kg/m as calculated from the following:   Height as of this encounter: 5\' 6"  (1.676 m).   Weight as of this encounter: 174 lb (78.9 kg).  Risk Assessment: Allergies: Reviewed. He is allergic to latex.  Allergy Precautions: None required Coagulopathies: Reviewed. None identified.  Blood-thinner therapy: None at this time Active Infection(s): Reviewed. None identified. Devin Mayer is afebrile  Site Confirmation: Devin Mayer was asked to confirm the procedure and laterality before marking the site Procedure checklist: Completed Consent: Before the procedure and under the influence of no sedative(s), amnesic(s), or anxiolytics, the patient was informed of the treatment options, risks and possible complications. To fulfill our ethical and legal obligations, as recommended by the American Medical Association's Code of Ethics, I have informed the patient of my clinical impression; the nature and purpose of the treatment or procedure; the risks, benefits, and possible complications of the intervention; the alternatives, including doing nothing; the risk(s) and benefit(s) of the alternative treatment(s) or procedure(s); and the risk(s) and benefit(s) of doing nothing. The patient was provided information about the general risks and possible complications associated with the procedure. These may include, but are not limited to: failure to achieve desired goals, infection, bleeding, organ or nerve damage, allergic reactions, paralysis, and death. In addition, the patient was informed of those risks and complications associated to Spine-related  procedures, such as failure to decrease pain; infection (i.e.: Meningitis, epidural or intraspinal abscess); bleeding (i.e.:  epidural hematoma, subarachnoid hemorrhage, or any other type of intraspinal or peri-dural bleeding); organ or nerve damage (i.e.: Any type of peripheral nerve, nerve root, or spinal cord injury) with subsequent damage to sensory, motor, and/or autonomic systems, resulting in permanent pain, numbness, and/or weakness of one or several areas of the body; allergic reactions; (i.e.: anaphylactic reaction); and/or death. Furthermore, the patient was informed of those risks and complications associated with the medications. These include, but are not limited to: allergic reactions (i.e.: anaphylactic or anaphylactoid reaction(s)); adrenal axis suppression; blood sugar elevation that in diabetics may result in ketoacidosis or comma; water retention that in patients with history of congestive heart failure may result in shortness of breath, pulmonary edema, and decompensation with resultant heart failure; weight gain; swelling or edema; medication-induced neural toxicity; particulate matter embolism and blood vessel occlusion with resultant organ, and/or nervous system infarction; and/or aseptic necrosis of one or more joints. Finally, the patient was informed that Medicine is not an exact science; therefore, there is also the possibility of unforeseen or unpredictable risks and/or possible complications that may result in a catastrophic outcome. The patient indicated having understood very clearly. We have given the patient no guarantees and we have made no promises. Enough time was given to the patient to ask questions, all of which were answered to the patient's satisfaction. Devin Mayer has indicated that he wanted to continue with the procedure. Attestation: I, the ordering provider, attest that I have discussed with the patient the benefits, risks, side-effects, alternatives, likelihood of  achieving goals, and potential problems during recovery for the procedure that I have provided informed consent. Date   Time: 05/12/2021  8:31 AM  Pre-Procedure Preparation:  Monitoring: As per clinic protocol. Respiration, ETCO2, SpO2, BP, heart rate and rhythm monitor placed and checked for adequate function Safety Precautions: Patient was assessed for positional comfort and pressure points before starting the procedure. Time-out: I initiated and conducted the "Time-out" before starting the procedure, as per protocol. The patient was asked to participate by confirming the accuracy of the "Time Out" information. Verification of the correct person, site, and procedure were performed and confirmed by me, the nursing staff, and the patient. "Time-out" conducted as per Joint Commission's Universal Protocol (UP.01.01.01). Time: 0923  Description of Procedure:          Area Prepped: Entire Posterior Lumbosacral Area DuraPrep (Iodine Povacrylex [0.7% available iodine] and Isopropyl Alcohol, 74% w/w) Safety Precautions: Aspiration looking for blood return was conducted prior to all injections. At no point did we inject any substances, as a needle was being advanced. No attempts were made at seeking any paresthesias. Safe injection practices and needle disposal techniques used. Medications properly checked for expiration dates. SDV (single dose vial) medications used. Description of the Procedure: Protocol guidelines were followed. The patient was placed in position over the procedure table. The target area was identified and the area prepped in the usual manner. Skin & deeper tissues infiltrated with local anesthetic. Appropriate amount of time allowed to pass for local anesthetics to take effect. The procedure needles were then advanced to the target area. Proper needle placement secured. Negative aspiration confirmed. Solution injected in intermittent fashion, asking for systemic symptoms every 0.5cc of  injectate. The needles were then removed and the area cleansed, making sure to leave some of the prepping solution back to take advantage of its long term bactericidal properties.  Vitals:   05/12/21 0915 05/12/21 0920 05/12/21 0925 05/12/21 0932  BP: (!) 138/91 133/88 Marland Kitchen)  136/97 (!) 142/80  Pulse: (!) 107 (!) 104 (!) 105   Resp: 20 (!) 23 19 16   Temp:    97.8 F (36.6 C)  TempSrc:    Temporal  SpO2: 96% 97% 96% 99%  Weight:      Height:        Start Time: 0923 hrs. End Time: 0927 hrs.  Materials:  Needle(s) Type: Spinal Needle Gauge: 22G Length: 3.5-in Medication(s): Please see orders for medications and dosing details. 3 cc solution made of 1 cc of preservative-free saline, 1 cc of 0.2% ropivacaine, 1 cc of Decadron 10 mg/cc injected along the right L3 nerve after contrast confirmation.  No paresthesias or pain noted during injection.  Imaging Guidance (Spinal):          Type of Imaging Technique: Fluoroscopy Guidance (Spinal) Indication(s): Assistance in needle guidance and placement for procedures requiring needle placement in or near specific anatomical locations not easily accessible without such assistance. Exposure Time: Please see nurses notes. Contrast: Before injecting any contrast, we confirmed that the patient did not have an allergy to iodine, shellfish, or radiological contrast. Once satisfactory needle placement was completed at the desired level, radiological contrast was injected. Contrast injected under live fluoroscopy. No contrast complications. See chart for type and volume of contrast used. Fluoroscopic Guidance: I was personally present during the use of fluoroscopy. "Tunnel Vision Technique" used to obtain the best possible view of the target area. Parallax error corrected before commencing the procedure. "Direction-depth-direction" technique used to introduce the needle under continuous pulsed fluoroscopy. Once target was reached, antero-posterior, oblique, and  lateral fluoroscopic projection used confirm needle placement in all planes. Images permanently stored in EMR. Interpretation: I personally interpreted the imaging intraoperatively. Adequate needle placement confirmed in multiple planes. Appropriate spread of contrast into desired area was observed. No evidence of afferent or efferent intravascular uptake. No intrathecal or subarachnoid spread observed. Permanent images saved into the patient's record.  Post-operative Assessment:  Post-procedure Vital Signs:  Pulse/HCG Rate:  (!) 105 (ST)94 Temp: 97.8 F (36.6 C) Resp: 16 BP:  (!) 142/80 SpO2: 99 %  EBL: None  Complications: No immediate post-treatment complications observed by team, or reported by patient.  Note: The patient tolerated the entire procedure well. A repeat set of vitals were taken after the procedure and the patient was kept under observation following institutional policy, for this type of procedure. Post-procedural neurological assessment was performed, showing return to baseline, prior to discharge. The patient was provided with post-procedure discharge instructions, including a section on how to identify potential problems. Should any problems arise concerning this procedure, the patient was given instructions to immediately contact us, at any time, without hesitation. In any case, we plan to contact the patient by telephone for a follow-up status report regarding this interventional procedure.  Comments:  No additional relevant information.  Plan of Care  Orders:  Orders Placed This Encounter  Procedures   DG PA[facility] C-ARM 1-60 MIN NO REPORT    Intraoperative interpretation by procedural physician at [facility].    Standing Status:   Standing    Number of Occurrences:   1    Order Specific Question:   Reason for exam:    Answer:   Assistance in needle guidance and placement for procedures requiring needle placement in or near specific anatomical  locations not easily accessible without such assistance.    Medications ordered for procedure: Meds ordered this encounter  Medications   iohexol (OMNIPAQUE) 180 MG/ML  injection 10 mL    Must be Myelogram-compatible. If not available, you may substitute with a water-soluble, non-ionic, hypoallergenic, myelogram-compatible radiological contrast medium.   lidocaine (XYLOCAINE) 2 % (with pres) injection 400 mg   midazolam (VERSED) 5 MG/5ML injection 0.5-2 mg    Make sure Flumazenil is available in the pyxis when using this medication. If oversedation occurs, administer 0.2 mg IV over 15 sec. If after 45 sec no response, administer 0.2 mg again over 1 min; may repeat at 1 min intervals; not to exceed 4 doses (1 mg)   sodium chloride flush (NS) 0.9 % injection 1 mL   ropivacaine (PF) 2 mg/mL (0.2%) (NAROPIN) injection 1 mL   dexamethasone (DECADRON) injection 10 mg   Medications administered: We administered iohexol, lidocaine, midazolam, sodium chloride flush, ropivacaine (PF) 2 mg/mL (0.2%), and dexamethasone.  See the medical record for exact dosing, route, and time of administration.  Follow-up plan:   Return in about 3 months (around 08/10/2021) for Post Procedure Evaluation, virtual.       L5/S1 ESI 03/17/21, TF RIGHT L3 ESI 05/12/21    Disposition: Discharge home  Discharge (Date   Time): 05/12/2021; 0934 hrs.   Primary Care Physician: Olin Hauser, DO Location: [facility] Note by: Gillis Santa, MD Date: 05/12/2021; Time: 10:49 AM  Disclaimer:  Medicine is not an exact science. The only guarantee in medicine is that nothing is guaranteed. It is important to note that the decision to proceed with this intervention was based on the information collected from the patient. The Data and conclusions were drawn from the patient's questionnaire, the interview, and the physical examination. Because the information was provided in large part by the  patient, it cannot be guaranteed that it has not been purposely or unconsciously manipulated. Every effort has been made to obtain as much relevant data as possible for this evaluation. It is important to note that the conclusions that lead to this procedure are derived in large part from the available data. Always take into account that the treatment will also be dependent on availability of resources and existing treatment guidelines, considered by other Pain Management Practitioners as being common knowledge and practice, at the time of the intervention. For Medico-Legal purposes, it is also important to point out that variation in procedural techniques and pharmacological choices are the acceptable norm. The indications, contraindications, technique, and results of the above procedure should only be interpreted and judged by a Board-Certified Interventional Pain Specialist with extensive familiarity and expertise in the same exact procedure and technique.

## 2021-05-12 NOTE — Patient Instructions (Signed)

## 2021-05-13 ENCOUNTER — Telehealth: Payer: Self-pay

## 2021-05-13 NOTE — Telephone Encounter (Signed)
Post procedure phone call.  LM 

## 2021-05-21 ENCOUNTER — Telehealth: Payer: Self-pay | Admitting: Student in an Organized Health Care Education/Training Program

## 2021-05-21 NOTE — Telephone Encounter (Signed)
Spoke with patient and he states that his pain has come back since having the procedure.  States he did get some relief after the procedure.  Patient would like to discuss with Dr Holley Raring.  Can you please schedule him a virtual appointment as soon as available.

## 2021-05-21 NOTE — Telephone Encounter (Signed)
Patient is in a lot of pain since procedure / can hardly walk. Please call to assess. They are asking for another appt. But Dr. Holley Raring put his PPE out to March.

## 2021-05-27 ENCOUNTER — Ambulatory Visit
Payer: Medicaid Other | Attending: Student in an Organized Health Care Education/Training Program | Admitting: Student in an Organized Health Care Education/Training Program

## 2021-05-27 ENCOUNTER — Encounter: Payer: Self-pay | Admitting: Student in an Organized Health Care Education/Training Program

## 2021-05-27 ENCOUNTER — Other Ambulatory Visit: Payer: Self-pay

## 2021-05-27 DIAGNOSIS — M5136 Other intervertebral disc degeneration, lumbar region: Secondary | ICD-10-CM

## 2021-05-27 DIAGNOSIS — G894 Chronic pain syndrome: Secondary | ICD-10-CM

## 2021-05-27 DIAGNOSIS — M48061 Spinal stenosis, lumbar region without neurogenic claudication: Secondary | ICD-10-CM

## 2021-05-27 NOTE — Patient Instructions (Signed)
______________________________________________________________________  Preparing for Procedure with Sedation  NOTICE: Due to recent regulatory changes, starting on December 23, 2020, procedures requiring intravenous (IV) sedation will no longer be performed at the [facility].  These types of procedures are required to be performed at [facility] ambulatory surgery facility.  We are very sorry for the inconvenience.  Procedure appointments are limited to planned procedures: No Prescription Refills. No disability issues will be discussed. No medication changes will be discussed.  Instructions: Oral Intake: Do not eat or drink anything for at least 8 hours prior to your procedure. (Exception: Blood Pressure Medication. See below.) Transportation: A driver is required. You may not drive yourself after the procedure. Blood Pressure Medicine: Do not forget to take your blood pressure medicine with a sip of water the morning of the procedure. If your Diastolic (lower reading) is above 100 mmHg, elective cases will be cancelled/rescheduled. Blood thinners: These will need to be stopped for procedures. Notify our staff if you are taking any blood thinners. Depending on which one you take, there will be specific instructions on how and when to stop it. Diabetics on insulin: Notify the staff so that you can be scheduled 1st case in the morning. If your diabetes requires high dose insulin, take only  of your normal insulin dose the morning of the procedure and notify the staff that you have done so. Preventing infections: Shower with an antibacterial soap the morning of your procedure. Build-up your immune system: Take 1000 mg of Vitamin C with every meal (3 times a day) the day prior to your procedure. Antibiotics: Inform the staff if you have a condition or reason that requires you to take antibiotics before dental procedures. Pregnancy: If you are pregnant, call and cancel the procedure. Sickness: If  you have a cold, fever, or any active infections, call and cancel the procedure. Arrival: You must be in the facility at least 30 minutes prior to your scheduled procedure. Children: Do not bring children with you. Dress appropriately: Bring dark clothing that you would not mind if they get stained. Valuables: Do not bring any jewelry or valuables.  Reasons to call and reschedule or cancel your procedure: (Following these recommendations will minimize the risk of a serious complication.) Surgeries: Avoid having procedures within 2 weeks of any surgery. (Avoid for 2 weeks before or after any surgery). Flu Shots: Avoid having procedures within 2 weeks of a flu shots. (Avoid for 2 weeks before or after immunizations). Barium: Avoid having a procedure within 7-10 days after having had a radiological study involving the use of radiological contrast. (Myelograms, Barium swallow or enema study). Heart attacks: Avoid any elective procedures or surgeries for the initial 6 months after a "Myocardial Infarction" (Heart Attack). Blood thinners: It is imperative that you stop these medications before procedures. Let us know if you if you take any blood thinner.  Infection: Avoid procedures during or within two weeks of an infection (including chest colds or gastrointestinal problems). Symptoms associated with infections include: Localized redness, fever, chills, night sweats or profuse sweating, burning sensation when voiding, cough, congestion, stuffiness, runny nose, sore throat, diarrhea, nausea, vomiting, cold or Flu symptoms, recent or current infections. It is specially important if the infection is over the area that we intend to treat. Heart and lung problems: Symptoms that may suggest an active cardiopulmonary problem include: cough, chest pain, breathing difficulties or shortness of breath, dizziness, ankle swelling, uncontrolled high or unusually low blood pressure, and/or palpitations. If you are    experiencing any of these symptoms, cancel your procedure and contact your primary care physician for an evaluation.  Remember:  Regular Business hours are:  Monday to Thursday 8:00 AM to 4:00 PM  Provider's Schedule: Francisco Naveira, MD:  Procedure days: Tuesday and Thursday 7:30 AM to 4:00 PM  Bilal Lateef, MD:  Procedure days: Monday and Wednesday 7:30 AM to 4:00 PM ______________________________________________________________________   

## 2021-05-27 NOTE — Progress Notes (Signed)
Patient: Devin Mayer  Service Category: E/M  Provider: Gillis Santa, MD  DOB: 07-Feb-1956  DOS: 05/27/2021  Location: Office  MRN: [id number]  Setting: Ambulatory outpatient  Referring Provider: Nobie Putnam *  Type: Established Patient  Specialty: Interventional Pain Management  PCP: Olin Hauser, DO  Location: Remote location  Delivery: TeleHealth     Virtual Encounter - Pain Management PROVIDER NOTE: Information contained herein reflects review and annotations entered in association with encounter. Interpretation of such information and data should be left to medically-trained personnel. Information provided to patient can be located elsewhere in the medical record under "Patient Instructions". Document created using STT-dictation technology, any transcriptional errors that may result from process are unintentional.    Contact & Pharmacy Preferred: [phone] Home: [phone] (home) Mobile: [phone] (mobile) E-mail: [email or web address]  [facility] #[id number]Phone: [phone]: [phone]  Pre-screening  Mr. [phone] "in-person" vs "virtual" encounter. He indicated preferring virtual for this encounter.   Reason COVID-19*   Social distancing based on CDC and AMA recommendations.   I contacted SCain Fitzhenryon 05/27/2021 via telephone.      I clearly identified myself as B14/07/2021 MD. I verified that I was speaking with the correct person using two identifiers (Name: SRosa Gambale and date of birth: 5Oct 31, 1957.  Consent I sought verbal advanced consent from Devin Mayer virtual visit interactions. I informed Mr. TOzmentof possible security and privacy concerns, risks, and limitations associated with providing "not-in-person" medical evaluation and management services. I also informed Mr. TGivlerof the availability of "in-person" appointments. Finally, I informed him that  there would be a charge for the virtual visit and that he could be  personally, fully or partially, financially responsible for it. Mr. TMoncriefexpressed understanding and agreed to proceed.   Historic Elements   Devin Mayer a 66y.o. year old, male patient evaluated today after our last contact on 05/21/2021. Devin Mayer has a past medical history of Cancer ([facility], COPD (chronic obstructive pulmonary disease) ([place], [place] complication ([place], Hyperlipidemia, Hypertension, and Stroke ([place]. He also  has a past surgical history that includes Shoulder surgery (Bilateral) and Cholecystectomy. Devin Mayer a current medication list which includes the following prescription(s): albuterol, aspirin, bayer back & body, clonazepam, diclofenac, famotidine, lantus solostar, januvia, metformin, nortriptyline, potassium chloride sa, triamcinolone cream, and aspirin ec. He  reports that he has been smoking cigars. He has never used smokeless tobacco. He reports current alcohol use. He reports that he does not use drugs. Devin Mayer allergic to latex.   HPI  Today, he is being contacted for a post-procedure assessment.   Post-procedure evaluation     Procedure:          Anesthesia, Analgesia, Anxiolysis:  Type: Trans-Foraminal Epidural Steroid Injection #1  Purpose: Diagnostic/Therapeutic Region: Posterolateral Lumbosacral Target Area: The 6 o'clock position under the pedicle, on the affected side. Approach: Posterior Percutaneous Paravertebral approach. Level: L3 Level Laterality: Right-Sided       Anesthesia: Local (1-2% Lidocaine)  Anxiolysis: IV Versed bolus 1.5 mg  Sedation: None  Guidance: Fluoroscopy           Position: Prone   Indications: 1. 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 Chronic radicular lumbar pain   3. Chronic pain syndrome    Pain Score: Pre-procedure: 8 /10 Post-procedure: 0-No pain/10  Effectiveness:   Initial hour after procedure: 100 %  Subsequent 4-6 hours post-procedure: 100 %  Analgesia past initial 6 hours: 100 % (lasting 1 week)  Ongoing improvement:  Analgesic:  back to baseline Function: Devin Mayer reports improvement in function ROM: Devin Mayer reports improvement in ROM   Laboratory Chemistry Profile   Renal Lab Results  Component Value Date   BUN 17 12/19/2016   CREATININE 1.10 12/19/2016   GFRAA >60 12/19/2016   GFRNONAA >60 12/19/2016    Hepatic Lab Results  Component Value Date   AST 28 12/19/2016   ALT 31 12/19/2016   ALBUMIN 4.0 12/19/2016   ALKPHOS 83 12/19/2016   LIPASE 33 12/19/2016    Electrolytes Lab Results  Component Value Date   NA 138 12/19/2016   K 3.9 12/19/2016   CL 106 12/19/2016   CALCIUM 9.3 12/19/2016    Bone No results found for: VD25OH, VD125OH2TOT, KC1275TZ0, YF7494WH6, 25OHVITD1, 25OHVITD2, 25OHVITD3, TESTOFREE, TESTOSTERONE  Inflammation (CRP: Acute Phase) (ESR: Chronic Phase) Lab Results  Component Value Date   CRP 2.3 (H) 07/10/2020   ESRSEDRATE 7 07/10/2020         Note: Above Lab results reviewed.  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 Diagnoses of Other intervertebral disc degeneration, lumbar region and Chronic pain syndrome were also pertinent to this visit.  Plan of Care   Devin Mayer has a current medication list which includes the following long-term medication(s): albuterol, clonazepam, famotidine, lantus solostar, januvia, metformin, nortriptyline, and potassium chloride sa.  Postprocedural evaluation after her second lumbar epidural steroid injection.  His previous 1 was done on 05/12/2021 which was a right L3 transforaminal ESI.  He notes better pain relief and improvement in functional status for approximately 7 to 10 days after this epidural compared to his previous 1 which was an interlaminar injection done midline at  L5-S1.  This suggest that the patient's dermatomal pain is primarily arising from his right L3 nerve root.  We discussed repeating right L3 transforaminal ESI and adding additional volume.  Risks and benefits reviewed.  Future considerations would include referral to neurosurgery for surgical evaluation.  We will plan on doing a transforaminal ESI with IV Versed in clinic.  Orders:  Orders Placed This Encounter  Procedures   Lumbar Transforaminal Epidural    Standing Status:   Future    Standing Expiration Date:   06/27/2021    Scheduling Instructions:     RIGHT L3 TF ESI with IV Versed    Order Specific Question:   Where will this procedure be performed?    Answer:   [facility]   Follow-up plan:   Return in about 8 days (around 06/04/2021) for TF RIGHT L3 #2 with IV Versed (in clinic).     L5/S1 ESI 03/17/21, TF RIGHT L3 ESI 05/12/21    Recent Visits Date Type Provider Dept  05/12/21 Procedure visit Gillis Santa, MD [facility]  04/14/21 Office Visit Gillis Santa, MD [facility]  03/17/21 Procedure visit Gillis Santa, MD [facility]  03/04/21 Office Visit Gillis Santa, MD [facility]  Showing recent visits within past 90 days and meeting all other requirements Today's Visits Date Type Provider Dept  05/27/21 Office Visit Gillis Santa, MD [facility]  Showing today's visits and meeting all other requirements Future Appointments Date Type Provider Dept  08/11/21 Appointment Gillis Santa, MD [facility]  Showing future  appointments within next 90 days and meeting all other requirements  I discussed the assessment and treatment plan with the patient. The patient was provided an opportunity to ask questions and all were answered. The patient agreed with the plan and demonstrated an understanding of the instructions.  Patient advised to call back or seek an in-person evaluation if the symptoms or condition  worsens.  Duration of encounter: 48mnutes.  Note by: BGillis Santa MD Date: 05/27/2021; Time: 11:25 AM

## 2021-06-04 ENCOUNTER — Ambulatory Visit (HOSPITAL_BASED_OUTPATIENT_CLINIC_OR_DEPARTMENT_OTHER): Payer: 59 | Admitting: Student in an Organized Health Care Education/Training Program

## 2021-06-04 ENCOUNTER — Encounter: Payer: Self-pay | Admitting: Student in an Organized Health Care Education/Training Program

## 2021-06-04 ENCOUNTER — Other Ambulatory Visit: Payer: Self-pay

## 2021-06-04 ENCOUNTER — Ambulatory Visit
Admission: RE | Admit: 2021-06-04 | Discharge: 2021-06-04 | Disposition: A | Discharge status: Home / Self Care | Payer: 59 | Source: Ambulatory Visit | Attending: Student in an Organized Health Care Education/Training Program | Admitting: Student in an Organized Health Care Education/Training Program

## 2021-06-04 VITALS — BP 138/73 | HR 91 | Temp 97.0°F | Resp 20 | Ht 66.0 in | Wt 172.0 lb

## 2021-06-04 DIAGNOSIS — G894 Chronic pain syndrome: Secondary | ICD-10-CM | POA: Insufficient documentation

## 2021-06-04 DIAGNOSIS — M5416 Radiculopathy, lumbar region: Secondary | ICD-10-CM | POA: Insufficient documentation

## 2021-06-04 DIAGNOSIS — G8929 Other chronic pain: Secondary | ICD-10-CM

## 2021-06-04 DIAGNOSIS — M48061 Spinal stenosis, lumbar region without neurogenic claudication: Secondary | ICD-10-CM | POA: Diagnosis not present

## 2021-06-04 MED ORDER — SODIUM CHLORIDE (PF) 0.9 % IJ SOLN
INTRAMUSCULAR | Status: AC
  Filled 2021-06-04: qty 10

## 2021-06-04 MED ORDER — MIDAZOLAM HCL 5 MG/5ML IJ SOLN
0.5000 mg | Freq: Once | INTRAMUSCULAR | Status: AC
  Administered 2021-06-04: 1.5 mg via INTRAVENOUS
  Filled 2021-06-04: qty 5

## 2021-06-04 MED ORDER — LIDOCAINE HCL 2 % IJ SOLN
20.0000 mL | Freq: Once | INTRAMUSCULAR | Status: AC
  Administered 2021-06-04: 200 mg
  Filled 2021-06-04: qty 40

## 2021-06-04 MED ORDER — ROPIVACAINE HCL 2 MG/ML IJ SOLN
1.0000 mL | Freq: Once | INTRAMUSCULAR | Status: AC
  Administered 2021-06-04: 1 mL via EPIDURAL

## 2021-06-04 MED ORDER — SODIUM CHLORIDE 0.9% FLUSH
1.0000 mL | Freq: Once | INTRAVENOUS | Status: AC
  Administered 2021-06-04: 1 mL

## 2021-06-04 MED ORDER — DEXAMETHASONE SODIUM PHOSPHATE 10 MG/ML IJ SOLN
10.0000 mg | Freq: Once | INTRAMUSCULAR | Status: AC
  Administered 2021-06-04: 10 mg
  Filled 2021-06-04: qty 1

## 2021-06-04 MED ORDER — ROPIVACAINE HCL 2 MG/ML IJ SOLN
INTRAMUSCULAR | Status: AC
  Filled 2021-06-04: qty 20

## 2021-06-04 MED ORDER — IOHEXOL 180 MG/ML  SOLN
10.0000 mL | Freq: Once | INTRAMUSCULAR | Status: AC
  Administered 2021-06-04: 5 mL via EPIDURAL

## 2021-06-04 NOTE — Progress Notes (Signed)
Safety precautions to be maintained throughout the outpatient stay will include: orient to surroundings, keep bed in low position, maintain call bell within reach at all times, provide assistance with transfer out of bed and ambulation.  

## 2021-06-04 NOTE — Patient Instructions (Signed)

## 2021-06-04 NOTE — Progress Notes (Signed)
PROVIDER NOTE: Information contained herein reflects review and annotations entered in association with encounter. Interpretation of such information and data should be left to medically-trained personnel. Information provided to patient can be located elsewhere in the medical record under "Patient Instructions". Document created using STT-dictation technology, any transcriptional errors that may result from process are unintentional.    Patient: Devin Mayer  Service Category: Procedure Provider: Gillis Santa, MD DOB: June 19, 1955 DOS: 06/04/2021 Location: [facility] MRN: [id number] Setting: Ambulatory - outpatient Referring Provider: Nobie Putnam * Type: Established Patient Specialty: Interventional Pain Management PCP: Olin Hauser, DO  Primary Reason for Visit: Interventional Pain Management Treatment. CC: Back Pain (low)    Procedure:          Anesthesia, Analgesia, Anxiolysis:  Type: Trans-Foraminal Epidural Steroid Injection #2  Purpose: Diagnostic/Therapeutic Region: Posterolateral Lumbosacral Target Area: The 6 o'clock position under the pedicle, on the affected side. Approach: Posterior Percutaneous Paravertebral approach. Level: L3 Level Laterality: Right-Sided       Anesthesia: Local (1-2% Lidocaine)  Anxiolysis: IV Versed bolus 1.5 mg  Sedation: Minimal  Guidance: Fluoroscopy           Position: Prone   Indications: 1. 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 Chronic radicular lumbar pain   3. Chronic pain syndrome    Pain Score: Pre-procedure: 8 /10 Post-procedure: 0-No pain/10     Pre-op H&P Assessment:  Mr. Creveling is a 66 y.o. (year old), male patient, seen today for interventional treatment. He  has a past surgical history that includes Shoulder surgery (Bilateral) and Cholecystectomy. Mr. Gullo has a current medication list which includes the following prescription(s): albuterol, aspirin, bayer  back & body, clonazepam, diclofenac, famotidine, lantus solostar, januvia, metformin, nortriptyline, potassium chloride sa, triamcinolone cream, and aspirin ec. His primarily concern today is the Back Pain (low)  Initial Vital Signs:  Pulse/HCG Rate: 91ECG Heart Rate: 90 Temp:  (!) 97 F (36.1 C) Resp: 18 BP: 135/69 SpO2: 99 %  BMI: Estimated body mass index is 27.76 kg/m as calculated from the following:   Height as of this encounter: 5\' 6"  (1.676 m).   Weight as of this encounter: 172 lb (78 kg).  Risk Assessment: Allergies: Reviewed. He is allergic to latex.  Allergy Precautions: None required Coagulopathies: Reviewed. None identified.  Blood-thinner therapy: None at this time Active Infection(s): Reviewed. None identified. Mr. Hedden is afebrile  Site Confirmation: Mr. Flax was asked to confirm the procedure and laterality before marking the site Procedure checklist: Completed Consent: Before the procedure and under the influence of no sedative(s), amnesic(s), or anxiolytics, the patient was informed of the treatment options, risks and possible complications. To fulfill our ethical and legal obligations, as recommended by the American Medical Association's Code of Ethics, I have informed the patient of my clinical impression; the nature and purpose of the treatment or procedure; the risks, benefits, and possible complications of the intervention; the alternatives, including doing nothing; the risk(s) and benefit(s) of the alternative treatment(s) or procedure(s); and the risk(s) and benefit(s) of doing nothing. The patient was provided information about the general risks and possible complications associated with the procedure. These may include, but are not limited to: failure to achieve desired goals, infection, bleeding, organ or nerve damage, allergic reactions, paralysis, and death. In addition, the patient was informed of those risks and complications associated to  Spine-related procedures, such as failure to decrease pain; infection (i.e.: Meningitis, epidural or intraspinal abscess); bleeding (i.e.:  epidural hematoma, subarachnoid hemorrhage, or any other type of intraspinal or peri-dural bleeding); organ or nerve damage (i.e.: Any type of peripheral nerve, nerve root, or spinal cord injury) with subsequent damage to sensory, motor, and/or autonomic systems, resulting in permanent pain, numbness, and/or weakness of one or several areas of the body; allergic reactions; (i.e.: anaphylactic reaction); and/or death. Furthermore, the patient was informed of those risks and complications associated with the medications. These include, but are not limited to: allergic reactions (i.e.: anaphylactic or anaphylactoid reaction(s)); adrenal axis suppression; blood sugar elevation that in diabetics may result in ketoacidosis or comma; water retention that in patients with history of congestive heart failure may result in shortness of breath, pulmonary edema, and decompensation with resultant heart failure; weight gain; swelling or edema; medication-induced neural toxicity; particulate matter embolism and blood vessel occlusion with resultant organ, and/or nervous system infarction; and/or aseptic necrosis of one or more joints. Finally, the patient was informed that Medicine is not an exact science; therefore, there is also the possibility of unforeseen or unpredictable risks and/or possible complications that may result in a catastrophic outcome. The patient indicated having understood very clearly. We have given the patient no guarantees and we have made no promises. Enough time was given to the patient to ask questions, all of which were answered to the patient's satisfaction. Mr. Baley has indicated that he wanted to continue with the procedure. Attestation: I, the ordering provider, attest that I have discussed with the patient the benefits, risks, side-effects, alternatives,  likelihood of achieving goals, and potential problems during recovery for the procedure that I have provided informed consent. Date   Time: 06/04/2021  8:54 AM  Pre-Procedure Preparation:  Monitoring: As per clinic protocol. Respiration, ETCO2, SpO2, BP, heart rate and rhythm monitor placed and checked for adequate function Safety Precautions: Patient was assessed for positional comfort and pressure points before starting the procedure. Time-out: I initiated and conducted the "Time-out" before starting the procedure, as per protocol. The patient was asked to participate by confirming the accuracy of the "Time Out" information. Verification of the correct person, site, and procedure were performed and confirmed by me, the nursing staff, and the patient. "Time-out" conducted as per Joint Commission's Universal Protocol (UP.01.01.01). Time: 0944  Description of Procedure:          Area Prepped: Entire Posterior Lumbosacral Area DuraPrep (Iodine Povacrylex [0.7% available iodine] and Isopropyl Alcohol, 74% w/w) Safety Precautions: Aspiration looking for blood return was conducted prior to all injections. At no point did we inject any substances, as a needle was being advanced. No attempts were made at seeking any paresthesias. Safe injection practices and needle disposal techniques used. Medications properly checked for expiration dates. SDV (single dose vial) medications used. Description of the Procedure: Protocol guidelines were followed. The patient was placed in position over the procedure table. The target area was identified and the area prepped in the usual manner. Skin & deeper tissues infiltrated with local anesthetic. Appropriate amount of time allowed to pass for local anesthetics to take effect. The procedure needles were then advanced to the target area. Proper needle placement secured. Negative aspiration confirmed. Solution injected in intermittent fashion, asking for systemic symptoms every  0.5cc of injectate. The needles were then removed and the area cleansed, making sure to leave some of the prepping solution back to take advantage of its long term bactericidal properties.  Vitals:   06/04/21 0941 06/04/21 0946 06/04/21 0951 06/04/21 1001  BP: 113/90 128/89 122/86 138/73  Pulse:      Resp: 14 (!) 28 (!) 24 20  Temp:      SpO2: 95% 93% 92% 98%  Weight:      Height:        Start Time: 0944 hrs. End Time: 0950 hrs.  Materials:  Needle(s) Type: Spinal Needle Gauge: 22G Length: 3.5-in Medication(s): Please see orders for medications and dosing details. 3 cc solution made of 1 cc of preservative-free saline, 1 cc of 0.2% ropivacaine, 1 cc of Decadron 10 mg/cc injected along the right L3 nerve after contrast confirmation.  No paresthesias or pain noted during injection.  Imaging Guidance (Spinal):          Type of Imaging Technique: Fluoroscopy Guidance (Spinal) Indication(s): Assistance in needle guidance and placement for procedures requiring needle placement in or near specific anatomical locations not easily accessible without such assistance. Exposure Time: Please see nurses notes. Contrast: Before injecting any contrast, we confirmed that the patient did not have an allergy to iodine, shellfish, or radiological contrast. Once satisfactory needle placement was completed at the desired level, radiological contrast was injected. Contrast injected under live fluoroscopy. No contrast complications. See chart for type and volume of contrast used. Fluoroscopic Guidance: I was personally present during the use of fluoroscopy. "Tunnel Vision Technique" used to obtain the best possible view of the target area. Parallax error corrected before commencing the procedure. "Direction-depth-direction" technique used to introduce the needle under continuous pulsed fluoroscopy. Once target was reached, antero-posterior, oblique, and lateral fluoroscopic projection used confirm needle  placement in all planes. Images permanently stored in EMR. Interpretation: I personally interpreted the imaging intraoperatively. Adequate needle placement confirmed in multiple planes. Appropriate spread of contrast into desired area was observed. No evidence of afferent or efferent intravascular uptake. No intrathecal or subarachnoid spread observed. Permanent images saved into the patient's record.  Post-operative Assessment:  Post-procedure Vital Signs:  Pulse/HCG Rate:  9187 Temp: (!) 97 F (36.1 C) Resp: 20 BP:  138/73 SpO2: 98 %  EBL: None  Complications: No immediate post-treatment complications observed by team, or reported by patient.  Note: The patient tolerated the entire procedure well. A repeat set of vitals were taken after the procedure and the patient was kept under observation following institutional policy, for this type of procedure. Post-procedural neurological assessment was performed, showing return to baseline, prior to discharge. The patient was provided with post-procedure discharge instructions, including a section on how to identify potential problems. Should any problems arise concerning this procedure, the patient was given instructions to immediately contact us, at any time, without hesitation. In any case, we plan to contact the patient by telephone for a follow-up status report regarding this interventional procedure.  Comments:  No additional relevant information.  Plan of Care  Orders:  Orders Placed This Encounter  Procedures   DG PAIN CLINIC C-ARM 1-60 MIN NO REPORT    Intraoperative interpretation by procedural physician at [place].    Standing Status:   Standing    Number of Occurrences:   1    Order Specific Question:   Reason for exam:    Answer:   Assistance in needle guidance and placement for procedures requiring needle placement in or near specific anatomical locations not easily accessible without such assistance.     Medications ordered for procedure: Meds ordered this encounter  Medications   iohexol (OMNIPAQUE) 180 MG/ML injection 10 mL    Must be Myelogram-compatible. If not available, you may substitute with a  water-soluble, non-ionic, hypoallergenic, myelogram-compatible radiological contrast medium.   lidocaine (XYLOCAINE) 2 % (with pres) injection 400 mg   midazolam (VERSED) 5 MG/5ML injection 0.5-2 mg    Make sure Flumazenil is available in the pyxis when using this medication. If oversedation occurs, administer 0.2 mg IV over 15 sec. If after 45 sec no response, administer 0.2 mg again over 1 min; may repeat at 1 min intervals; not to exceed 4 doses (1 mg)   sodium chloride flush (NS) 0.9 % injection 1 mL   ropivacaine (PF) 2 mg/mL (0.2%) (NAROPIN) injection 1 mL   dexamethasone (DECADRON) injection 10 mg   Medications administered: We administered iohexol, lidocaine, midazolam, sodium chloride flush, ropivacaine (PF) 2 mg/mL (0.2%), and dexamethasone.  See the medical record for exact dosing, route, and time of administration.  Follow-up plan:   Return in about 4 weeks (around 07/02/2021) for Post Procedure Evaluation, virtual.       L5/S1 ESI 03/17/21, TF RIGHT L3 ESI 05/12/21, 06/04/21    Disposition: Discharge home  Discharge (Date   Time): 06/04/2021; 1005 hrs.   Primary Care Physician: Olin Hauser, DO Location: [facility] Note by: Gillis Santa, MD Date: 06/04/2021; Time: 10:25 AM  Disclaimer:  Medicine is not an exact science. The only guarantee in medicine is that nothing is guaranteed. It is important to note that the decision to proceed with this intervention was based on the information collected from the patient. The Data and conclusions were drawn from the patient's questionnaire, the interview, and the physical examination. Because the information was provided in large part by the patient, it cannot be guaranteed that it has not been  purposely or unconsciously manipulated. Every effort has been made to obtain as much relevant data as possible for this evaluation. It is important to note that the conclusions that lead to this procedure are derived in large part from the available data. Always take into account that the treatment will also be dependent on availability of resources and existing treatment guidelines, considered by other Pain Management Practitioners as being common knowledge and practice, at the time of the intervention. For Medico-Legal purposes, it is also important to point out that variation in procedural techniques and pharmacological choices are the acceptable norm. The indications, contraindications, technique, and results of the above procedure should only be interpreted and judged by a Board-Certified Interventional Pain Specialist with extensive familiarity and expertise in the same exact procedure and technique.

## 2021-06-05 ENCOUNTER — Telehealth: Payer: Self-pay | Admitting: *Deleted

## 2021-06-05 NOTE — Telephone Encounter (Signed)
Denies any post procedure issues. 

## 2021-06-10 ENCOUNTER — Encounter: Payer: Self-pay | Admitting: Student in an Organized Health Care Education/Training Program

## 2021-07-01 ENCOUNTER — Ambulatory Visit
Payer: 59 | Attending: Student in an Organized Health Care Education/Training Program | Admitting: Student in an Organized Health Care Education/Training Program

## 2021-07-01 ENCOUNTER — Other Ambulatory Visit: Payer: Self-pay

## 2021-07-01 ENCOUNTER — Encounter: Payer: Self-pay | Admitting: Student in an Organized Health Care Education/Training Program

## 2021-07-01 DIAGNOSIS — G894 Chronic pain syndrome: Secondary | ICD-10-CM

## 2021-07-01 DIAGNOSIS — M48061 Spinal stenosis, lumbar region without neurogenic claudication: Secondary | ICD-10-CM

## 2021-07-01 DIAGNOSIS — M5134 Other intervertebral disc degeneration, thoracic region: Secondary | ICD-10-CM | POA: Diagnosis not present

## 2021-07-01 DIAGNOSIS — M5416 Radiculopathy, lumbar region: Secondary | ICD-10-CM | POA: Diagnosis not present

## 2021-07-01 DIAGNOSIS — G8929 Other chronic pain: Secondary | ICD-10-CM

## 2021-07-01 DIAGNOSIS — M5136 Other intervertebral disc degeneration, lumbar region: Secondary | ICD-10-CM

## 2021-07-01 MED ORDER — GABAPENTIN 300 MG PO CAPS: 300.0000 mg | ORAL_CAPSULE | Freq: Three times a day (TID) | ORAL | 2 refills | Status: DC

## 2021-07-01 MED ORDER — TIZANIDINE HCL 4 MG PO TABS: 4.0000 mg | ORAL_TABLET | Freq: Two times a day (BID) | ORAL | 2 refills | Status: AC | PRN

## 2021-07-01 NOTE — Progress Notes (Signed)
Patient: Devin Mayer  Service Category: E/M  Provider: Gillis Santa, MD  DOB: Sep 21, 1955  DOS: 07/01/2021  Location: Office  MRN: [id number]  Setting: Ambulatory outpatient  Referring Provider: Nobie Mayer *  Type: Established Patient  Specialty: Interventional Pain Management  PCP: Devin Hauser, DO  Location: Remote location  Delivery: TeleHealth     Virtual Encounter - Pain Management PROVIDER NOTE: Information contained herein reflects review and annotations entered in association with encounter. Interpretation of such information and data should be left to medically-trained personnel. Information provided to patient can be located elsewhere in the medical record under "Patient Instructions". Document created using STT-dictation technology, any transcriptional errors that may result from process are unintentional.    Contact & Pharmacy Preferred: [phone] Home: [phone] (home) Mobile: [phone] (mobile) E-mail: [email or web address]  [id number] [place] [address]: [phone]: [phone]  Pre-screening  Devin Mayer "in-person" vs "virtual" encounter. He indicated preferring virtual for this encounter.   Reason COVID-19*   Social distancing based on CDC and AMA recommendations.   I contacted Devin Mayer 07/01/2021 via telephone.      I clearly identified myself as BGillis Santa MD. I verified that I was speaking with the correct person using two identifiers (Name: Devin Mayer and date of birth: 523-Mar-1957.  Consent I sought verbal advanced consent from Devin Bastfor virtual visit interactions. I informed Mr. TDapolitoof possible security and privacy concerns, risks, and limitations associated with providing "not-in-person" medical evaluation and management services. I also informed Devin Mayer the availability of "in-person" appointments. Finally, I informed him that  there would be a charge for the virtual visit and that he could be  personally, fully or partially, financially responsible for it. Devin Mayer understanding and agreed to proceed.   Historic Elements   Devin Mayer a 66y.o. year old, male patient evaluated today after our last contact on 06/04/2021. Devin Mayer ([facility], COPD (chronic obstructive pulmonary disease) ([place], Diabetes mellitus without complication ([place], Hyperlipidemia, Hypertension, and Stroke ([place]. He also  has a past surgical history that includes Shoulder surgery (Bilateral) and Cholecystectomy. [name] a current medication list which includes the following prescription(s): albuterol, aspirin, bayer back & body, clonazepam, diclofenac, famotidine, gabapentin, lantus solostar, januvia, metformin, nortriptyline, pantoprazole, potassium chloride sa, tizanidine, and triamcinolone cream. He  reports that he has been smoking cigars. He has never used smokeless tobacco. He reports current alcohol use. He reports that he does not use drugs. Devin Mayer to latex.   HPI  Today, he is being contacted for a post-procedure assessment.   Post-procedure evaluation     Procedure:          Anesthesia, Analgesia, Anxiolysis:  Type: Trans-Foraminal Epidural Steroid Injection #2  Purpose: Diagnostic/Therapeutic Region: Posterolateral Lumbosacral Target Area: The 6 o'clock position under the pedicle, on the affected side. Approach: Posterior Percutaneous Paravertebral approach. Level: L3 Level Laterality: Right-Sided       Anesthesia: Local (1-2% Lidocaine)  Anxiolysis: IV Versed bolus 1.5 mg  Sedation: Minimal  Guidance: Fluoroscopy           Position: Prone   Indications: 1. 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 Chronic radicular lumbar pain   3. Chronic pain syndrome    Pain Score: Pre-procedure: 8 /10 Post-procedure: 0-No pain/10  Effectiveness:  Initial hour after procedure: 100 %  Subsequent 4-6 hours post-procedure: 100 %  Analgesia past initial 6 hours: 100 % (pain relief lasted approx 3 - 4 days and then the pain came back just as before.)  Ongoing improvement:  Analgesic:  back to baseline    Laboratory Chemistry Profile   Renal Lab Results  Component Value Date   BUN 17 12/19/2016   CREATININE 1.10 12/19/2016   GFRAA >60 12/19/2016   GFRNONAA >60 12/19/2016    Hepatic Lab Results  Component Value Date   AST 28 12/19/2016   ALT 31 12/19/2016   ALBUMIN 4.0 12/19/2016   ALKPHOS 83 12/19/2016   LIPASE 33 12/19/2016    Electrolytes Lab Results  Component Value Date   NA 138 12/19/2016   K 3.9 12/19/2016   CL 106 12/19/2016   CALCIUM 9.3 12/19/2016    Bone No results found for: VD25OH, VD125OH2TOT, GN5621HY8, MV7846NG2, 25OHVITD1, 25OHVITD2, 25OHVITD3, TESTOFREE, TESTOSTERONE  Inflammation (CRP: Acute Phase) (ESR: Chronic Phase) Lab Results  Component Value Date   CRP 2.3 (H) 07/10/2020   ESRSEDRATE 7 07/10/2020         Note: Above Lab results reviewed.  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 Diagnoses of Chronic radicular lumbar pain, Other intervertebral disc degeneration, lumbar region, Lumbar degenerative disc disease, Degeneration of thoracic intervertebral disc, and Chronic pain syndrome were also pertinent to this visit.  Plan of Care   Devin Mayer has a current medication list which includes the following long-term medication(s): albuterol, clonazepam, famotidine, gabapentin, lantus solostar, januvia, metformin, nortriptyline, pantoprazole, and potassium chloride sa.   Limited benefit with transforaminal epidural steroid injections at right L3 TF ESI x2 and L5/S1 ESI x1. Recommend patient start gabapentin as below and utilize tizanidine as needed for lumbar paraspinal muscle  spasms. Referral to neurosurgery to consider surgical decompression for persistent lumbar radicular pain.  Pharmacotherapy (Medications Ordered): Meds ordered this encounter  Medications   gabapentin (NEURONTIN) 300 MG capsule    Sig: Take 1 capsule (300 mg total) by mouth 3 (three) times daily.    Dispense:  90 capsule    Refill:  2   tiZANidine (ZANAFLEX) 4 MG tablet    Sig: Take 1 tablet (4 mg total) by mouth 2 (two) times daily as needed for muscle spasms.    Dispense:  60 tablet    Refill:  2    Do not place this medication, or any other prescription from our practice, on "Automatic Refill". Patient may have prescription filled one day early if pharmacy is closed on scheduled refill date.   Orders:  Orders Placed This Encounter  Procedures   Ambulatory referral to Neurosurgery    Referral Priority:   Routine    Referral Type:   Surgical    Referral Reason:   Specialty Services Required    Referred to Provider:   [place]    Requested Specialty:   Neurosurgery    Number of Visits Requested:   1   Follow-up plan:   Return if symptoms worsen or fail to improve.     L5/S1 ESI 03/17/21, TF RIGHT L3 ESI 05/12/21, 06/04/21    Recent Visits Date Type Provider Dept  06/04/21 Procedure visit Devin Santa, MD [facility]  05/27/21 Office Visit Devin Santa, MD [facility]  05/12/21 Procedure visit Devin Santa, MD [facility]  04/14/21 Office Visit Holley Raring,  Carlus Pavlov, MD [facility]  Showing recent visits within past 90 days and meeting all other requirements Today's Visits Date Type Provider Dept  07/01/21 Office Visit Devin Santa, MD [facility]  Showing today's visits and meeting all other requirements Future Appointments Date Type Provider Dept  08/11/21 Appointment Devin Santa, MD [facility]  Showing future appointments within next 90 days and meeting all other  requirements  I discussed the assessment and treatment plan with the patient. The patient was provided an opportunity to ask questions and all were answered. The patient agreed with the plan and demonstrated an understanding of the instructions.  Patient advised to call back or seek an in-person evaluation if the symptoms or condition worsens.  Duration of encounter: 23mnutes.  Note by: BGillis Santa MD Date: 07/01/2021; Time: 3:39 PM

## 2021-07-02 ENCOUNTER — Encounter: Payer: Self-pay | Admitting: Family Medicine

## 2021-07-02 ENCOUNTER — Ambulatory Visit (INDEPENDENT_AMBULATORY_CARE_PROVIDER_SITE_OTHER): Payer: 59 | Admitting: Family Medicine

## 2021-07-02 ENCOUNTER — Telehealth: Payer: Self-pay | Admitting: Student in an Organized Health Care Education/Training Program

## 2021-07-02 VITALS — BP 129/63 | HR 88 | Ht 66.0 in | Wt 178.8 lb

## 2021-07-02 DIAGNOSIS — F5101 Primary insomnia: Secondary | ICD-10-CM | POA: Diagnosis not present

## 2021-07-02 DIAGNOSIS — E1169 Type 2 diabetes mellitus with other specified complication: Secondary | ICD-10-CM

## 2021-07-02 DIAGNOSIS — K219 Gastro-esophageal reflux disease without esophagitis: Secondary | ICD-10-CM | POA: Diagnosis not present

## 2021-07-02 DIAGNOSIS — E785 Hyperlipidemia, unspecified: Secondary | ICD-10-CM

## 2021-07-02 DIAGNOSIS — Z794 Long term (current) use of insulin: Secondary | ICD-10-CM

## 2021-07-02 DIAGNOSIS — E876 Hypokalemia: Secondary | ICD-10-CM

## 2021-07-02 DIAGNOSIS — E1140 Type 2 diabetes mellitus with diabetic neuropathy, unspecified: Secondary | ICD-10-CM

## 2021-07-02 DIAGNOSIS — C61 Malignant neoplasm of prostate: Secondary | ICD-10-CM

## 2021-07-02 MED ORDER — PANTOPRAZOLE SODIUM 40 MG PO TBEC: 40.0000 mg | DELAYED_RELEASE_TABLET | Freq: Every day | ORAL | 3 refills | Status: AC

## 2021-07-02 MED ORDER — JANUVIA 50 MG PO TABS: 50.0000 mg | ORAL_TABLET | Freq: Every day | ORAL | 5 refills | Status: DC

## 2021-07-02 MED ORDER — POTASSIUM CHLORIDE CRYS ER 20 MEQ PO TBCR: 20.0000 meq | EXTENDED_RELEASE_TABLET | Freq: Every day | ORAL | 3 refills | Status: AC

## 2021-07-02 MED ORDER — PRAVASTATIN SODIUM 40 MG PO TABS: 40.0000 mg | ORAL_TABLET | Freq: Every day | ORAL | 3 refills | Status: AC

## 2021-07-02 MED ORDER — CLONAZEPAM 0.5 MG PO TABS: 0.5000 mg | ORAL_TABLET | Freq: Every evening | ORAL | 5 refills | Status: AC | PRN

## 2021-07-02 NOTE — Progress Notes (Signed)
Subjective:    Patient ID: Devin Mayer, male    DOB: [birth year], 66 y.o.   MRN: [id number]  Devin Mayer is a 66 y.o. male presenting on 07/02/2021 for Diabetes and Anxiety   HPI  CHRONIC DM, Type 2: Due for lab today A1c Meds: Januvia 50mg  daily, Metformin XR 500mg  daily in PM No longer able to get insulin, high cost, not covered by insurance. Reports good compliance. Tolerating well w/o side-effects Not on ACEi ARB - due for urine microalbumin Denies hypoglycemia, polyuria, visual changes, numbness or tingling.   CHRONIC HTN: Reports  Current Meds - None.   Denies CP, dyspnea, HA, edema, dizziness / lightheadedness  Left Rib / Chest Wall Chronic Pain / Intercostal Neuralgia Chronic Lumbar DDD Pain Established with [facility] Dr Holley Raring Previously seen by Dr Melrose Nakayama  He has been managed for Neuroforaminal stenosis, and has been referred to Orthopedic doctors for Hip. And vascular doctor for Dr Lucky Cowboy.  Insomnia Chronic problem, with sleep maintenance On Clonazepam 0.5mg  nightly PRN, not regularly has if need. Prevous PCP managing   Depression screen [facility] 2/9 07/02/2021 06/04/2021 03/05/2021  Decreased Interest 0 0 3  Down, Depressed, Hopeless 0 0 0  PHQ - 2 Score 0 0 3  Altered sleeping 1 - 1  Tired, decreased energy 2 - 3  Change in appetite 1 - 3  Feeling bad or failure about yourself  0 - 0  Trouble concentrating 0 - 0  Moving slowly or fidgety/restless 0 - 3  Suicidal thoughts 0 - 0  PHQ-9 Score 4 - 13  Difficult doing work/chores Not difficult at all - Very difficult    Social History   Tobacco Use   Smoking status: Every Day    Types: Cigars   Smokeless tobacco: Never  Substance Use Topics   Alcohol use: Yes   Drug use: Never    Review of Systems Per HPI unless specifically indicated above     Objective:    BP 129/63    Pulse 88    Ht 5\' 6"  (1.676 m)    Wt 178 lb 12.8 oz (81.1 kg)    SpO2 99%    BMI 28.86 kg/m   Wt Readings from Last 3  Encounters:  07/02/21 178 lb 12.8 oz (81.1 kg)  06/04/21 172 lb (78 kg)  05/12/21 174 lb (78.9 kg)    Physical Exam Vitals and nursing note reviewed.  Constitutional:      General: He is not in acute distress.    Appearance: He is well-developed. He is not diaphoretic.     Comments: Well-appearing, comfortable, cooperative  HENT:     Head: Normocephalic and atraumatic.  Eyes:     General:        Right eye: No discharge.        Left eye: No discharge.     Conjunctiva/sclera: Conjunctivae normal.  Neck:     Thyroid: No thyromegaly.  Cardiovascular:     Rate and Rhythm: Normal rate and regular rhythm.     Pulses: Normal pulses.     Heart sounds: Normal heart sounds. No murmur heard. Pulmonary:     Effort: Pulmonary effort is normal. No respiratory distress.     Breath sounds: Normal breath sounds. No wheezing or rales.  Musculoskeletal:        General: Normal range of motion.     Cervical back: Normal range of motion and neck supple.  Lymphadenopathy:     Cervical: No cervical  adenopathy.  Skin:    General: Skin is warm and dry.     Findings: No erythema or rash.  Neurological:     Mental Status: He is alert and oriented to person, place, and time. Mental status is at baseline.  Psychiatric:        Behavior: Behavior normal.     Comments: Well groomed, good eye contact, normal speech and thoughts   Results for orders placed or performed in visit on 03/05/21  Hemoglobin A1c  Result Value Ref Range   Hemoglobin A1C 7.8       Assessment & Plan:   Problem List Items Addressed This Visit     Type 2 diabetes mellitus with diabetic neuropathy, with long-term current use of insulin ([place]) - Primary   Relevant Medications   JANUVIA 50 MG tablet   pravastatin (PRAVACHOL) 40 MG tablet   Other Relevant Orders   Urine Microalbumin w/creat. ratio   Hemoglobin A1c   COMPLETE METABOLIC PANEL WITH GFR   Prostate cancer ([place])   Relevant Orders   PSA   Other Visit Diagnoses      Hyperlipidemia associated with type 2 diabetes mellitus ([place])       Relevant Medications   JANUVIA 50 MG tablet   pravastatin (PRAVACHOL) 40 MG tablet   Gastroesophageal reflux disease without esophagitis       Relevant Medications   pantoprazole (PROTONIX) 40 MG tablet   Primary insomnia       Relevant Medications   clonazePAM (KLONOPIN) 0.5 MG tablet   Hypokalemia       Relevant Medications   potassium chloride SA (KLOR-CON M) 20 MEQ tablet   Other Relevant Orders   COMPLETE METABOLIC PANEL WITH GFR      Labs today Due for A1c Due for PSA - history of prostate cancer, previously surveillance by PCP, overdue. Hx hypokalemia - check chemistry lab.  Insomnia Reviewed PDMP Managed on CLonazepam PRN low dose, 10 pills per fill - not nightly use, as long as maintained low dose, we agree to continue this as PRN.  DM Uncertain current control, concern not able to get any insulin Will re order requested meds for now Revisit this based on A1c and next visit, may need CCM pharmacy assistance to help patient with med assistance or determining benefits/coverage for appropriate med management    Meds ordered this encounter  Medications   clonazePAM (KLONOPIN) 0.5 MG tablet    Sig: Take 1 tablet (0.5 mg total) by mouth at bedtime as needed.    Dispense:  10 tablet    Refill:  5   JANUVIA 50 MG tablet    Sig: Take 1 tablet (50 mg total) by mouth daily.    Dispense:  30 tablet    Refill:  5   pantoprazole (PROTONIX) 40 MG tablet    Sig: Take 1 tablet (40 mg total) by mouth daily before breakfast.    Dispense:  90 tablet    Refill:  3   pravastatin (PRAVACHOL) 40 MG tablet    Sig: Take 1 tablet (40 mg total) by mouth daily.    Dispense:  90 tablet    Refill:  3   potassium chloride SA (KLOR-CON M) 20 MEQ tablet    Sig: Take 1 tablet (20 mEq total) by mouth daily.    Dispense:  90 tablet    Refill:  3      Follow up plan: Return in about 3 months (around 09/29/2021) for  3  month follow-up DM A1c, HTN, Med refills.   Nobie Putnam, [facility] 07/02/2021, 3:19 PM

## 2021-07-02 NOTE — Patient Instructions (Addendum)
Thank you for coming to the office today.  Labs today and urine test  If you find an insulin that is covered let me know  Refilled all meds  Please schedule a Follow-up Appointment to: Return in about 3 months (around 09/29/2021) for 3 month follow-up DM A1c, HTN, Med refills.  If you have any other questions or concerns, please feel free to call the office or send a message through [place]. You may also schedule an earlier appointment if necessary.  Additionally, you may be receiving a survey about your experience at our office within a few days to 1 week by e-mail or mail. We value your feedback.  Nobie Putnam, DO [place]

## 2021-07-02 NOTE — Telephone Encounter (Signed)
Spoke with Devin Mayer, informed her that Tizanidine was prescribed yesterday.

## 2021-07-03 LAB — MICROALBUMIN / CREATININE URINE RATIO
Creatinine, Urine: 61 mg/dL (ref 20–320)
Microalb Creat Ratio: 10 mcg/mg creat (ref <30)
Microalb, Ur: 0.6 mg/dL

## 2021-07-03 LAB — COMPLETE METABOLIC PANEL WITH GFR
AG Ratio: 1.6 (calc) (ref 1.0–2.5)
ALT: 52 U/L — ABNORMAL HIGH (ref 9–46)
AST: 29 U/L (ref 10–35)
Albumin: 4.2 g/dL (ref 3.6–5.1)
Alkaline phosphatase (APISO): 97 U/L (ref 35–144)
BUN: 17 mg/dL (ref 7–25)
CO2: 26 mmol/L (ref 20–32)
Calcium: 9.3 mg/dL (ref 8.6–10.3)
Chloride: 104 mmol/L (ref 98–110)
Creat: 1.2 mg/dL (ref 0.70–1.35)
Globulin: 2.6 g/dL (calc) (ref 1.9–3.7)
Glucose, Bld: 273 mg/dL — ABNORMAL HIGH (ref 65–99)
Potassium: 4.2 mmol/L (ref 3.5–5.3)
Sodium: 137 mmol/L (ref 135–146)
Total Bilirubin: 0.4 mg/dL (ref 0.2–1.2)
Total Protein: 6.8 g/dL (ref 6.1–8.1)
eGFR: 68 mL/min/{1.73_m2} (ref >=60)

## 2021-07-03 LAB — HEMOGLOBIN A1C
Hgb A1c MFr Bld: 8.4 % of total Hgb — ABNORMAL HIGH (ref <5.7)
Mean Plasma Glucose: 194 mg/dL
eAG (mmol/L): 10.8 mmol/L

## 2021-07-03 LAB — PSA: PSA: <0.04 ng/mL (ref <=4.00)

## 2021-07-17 ENCOUNTER — Other Ambulatory Visit: Payer: Self-pay | Admitting: Student in an Organized Health Care Education/Training Program

## 2021-07-24 ENCOUNTER — Other Ambulatory Visit (INDEPENDENT_AMBULATORY_CARE_PROVIDER_SITE_OTHER): Payer: Self-pay | Admitting: Nurse Practitioner

## 2021-07-24 DIAGNOSIS — I6523 Occlusion and stenosis of bilateral carotid arteries: Secondary | ICD-10-CM

## 2021-07-24 NOTE — Telephone Encounter (Unsigned)
Copied from [place] [phone]. Topic: General - Other ?>> Jul 23, 2021  9:24 AM Tessa Lerner A wrote: ?Reason for CRM: Larene Beach with [employer] would like to speak with a member of clinical staff today 07/23/21 regarding the patient's insulin  ? ?The patient's insurance provider would like to coordinate their prescriptions  ? ?Please contact further when possible ? ?Larene Beach has stressed the urgency of their request ?

## 2021-07-25 ENCOUNTER — Ambulatory Visit (INDEPENDENT_AMBULATORY_CARE_PROVIDER_SITE_OTHER): Payer: 59

## 2021-07-25 ENCOUNTER — Encounter (INDEPENDENT_AMBULATORY_CARE_PROVIDER_SITE_OTHER): Payer: Self-pay

## 2021-07-25 ENCOUNTER — Ambulatory Visit (INDEPENDENT_AMBULATORY_CARE_PROVIDER_SITE_OTHER): Payer: 59 | Admitting: Vascular Surgery

## 2021-07-25 ENCOUNTER — Other Ambulatory Visit: Payer: Self-pay

## 2021-07-25 DIAGNOSIS — I6523 Occlusion and stenosis of bilateral carotid arteries: Secondary | ICD-10-CM

## 2021-07-31 NOTE — Telephone Encounter (Signed)
Devin Mayer with [employer] called back b/c she hd not heard from the dr concerning the pt's insulin.  The pharmacy told pt he has insulin left over from another dr, but since he is seeing Dr Raliegh Ip now, the pharmacy wants to make sure he is on the right dose along with his other meds. ?

## 2021-08-01 ENCOUNTER — Telehealth: Payer: Self-pay | Admitting: Family Medicine

## 2021-08-01 DIAGNOSIS — E1140 Type 2 diabetes mellitus with diabetic neuropathy, unspecified: Secondary | ICD-10-CM

## 2021-08-01 DIAGNOSIS — Z794 Long term (current) use of insulin: Secondary | ICD-10-CM

## 2021-08-01 NOTE — Telephone Encounter (Signed)
Medication Refill - Medication: insulin glargine (LANTUS SOLOSTAR) 100 UNIT/ML Solostar  ? ?Has the patient contacted their pharmacy? Yes.   ?(Larene Beach w/ the [employer] plan, called b/c the pt called her complaining the pharmacy will not give him his insulin.  The insulin they have was prescribed by another dr, and the pharmacist says he is waiting for a call back from his dr to confirm this insulin is the correct dosage. ? ?Preferred Pharmacy (with phone number or street name): [id number] [place] [place] [address]? ?Has the patient been seen for an appointment in the last year OR does the patient have an upcoming appointment? Yes.   ? ?Pt told the representative his sugars have been running 200-300 every day. ?She also said the LPringle?insulin IS covered under his health plan. ?

## 2021-08-01 NOTE — Telephone Encounter (Signed)
Pt. States his insurance will pay for Lantus Solostar. Asking if Dr. Parks Ranger will fill it for him. Please advise pt. ?

## 2021-08-01 NOTE — Telephone Encounter (Signed)
error 

## 2021-08-04 MED ORDER — LANTUS SOLOSTAR 100 UNIT/ML ~~LOC~~ SOPN
12.0000 [IU] | PEN_INJECTOR | Freq: Every day | SUBCUTANEOUS | 5 refills | Status: DC
Start: 2021-08-04 — End: 2021-08-05

## 2021-08-04 NOTE — Telephone Encounter (Signed)
Please let patient know ? ?Ordered Lantus Solostar insulin - use 12 units daily. ? ?[facility] ? ?Nobie Putnam, DO ?[facility] ?[facility] ?08/04/2021, 8:50 AM ? ?

## 2021-08-05 MED ORDER — LANTUS SOLOSTAR 100 UNIT/ML ~~LOC~~ SOPN: 12.0000 [IU] | PEN_INJECTOR | Freq: Every day | SUBCUTANEOUS | 5 refills | Status: AC

## 2021-08-05 NOTE — Telephone Encounter (Signed)
Pt called in says was told by pharmacy that the insurance will only cover the generic brand of insulin Lantas Solastar. Please call back ?

## 2021-08-05 NOTE — Telephone Encounter (Signed)
Okay, fixed it. Now generic is okay. Did not realize this when first received message, as they asked for Lantus brand it seemed ? ?Devin Putnam, DO ?[facility] ?[facility] ?08/05/2021, 4:04 PM ? ?

## 2021-08-05 NOTE — Addendum Note (Signed)
Addended by: Olin Hauser on: 08/05/2021 04:04 PM ? ? Modules accepted: Orders ? ?

## 2021-08-07 ENCOUNTER — Other Ambulatory Visit: Payer: Self-pay | Admitting: Student in an Organized Health Care Education/Training Program

## 2021-08-11 ENCOUNTER — Ambulatory Visit (HOSPITAL_BASED_OUTPATIENT_CLINIC_OR_DEPARTMENT_OTHER): Payer: 59 | Admitting: Student in an Organized Health Care Education/Training Program

## 2021-08-11 DIAGNOSIS — M5416 Radiculopathy, lumbar region: Secondary | ICD-10-CM

## 2021-08-11 DIAGNOSIS — G8929 Other chronic pain: Secondary | ICD-10-CM

## 2021-08-12 NOTE — Progress Notes (Signed)
No appointment needed.

## 2021-08-15 ENCOUNTER — Other Ambulatory Visit: Payer: Self-pay

## 2021-08-15 ENCOUNTER — Encounter: Payer: Self-pay | Admitting: Emergency Medicine

## 2021-08-15 ENCOUNTER — Emergency Department
Admission: EM | Admit: 2021-08-15 | Discharge: 2021-08-15 | Disposition: A | Discharge status: Home / Self Care | Payer: 59 | Attending: Emergency Medicine | Admitting: Emergency Medicine

## 2021-08-15 ENCOUNTER — Emergency Department: Payer: 59

## 2021-08-15 DIAGNOSIS — R109 Unspecified abdominal pain: Secondary | ICD-10-CM | POA: Diagnosis present

## 2021-08-15 DIAGNOSIS — I10 Essential (primary) hypertension: Secondary | ICD-10-CM | POA: Diagnosis not present

## 2021-08-15 DIAGNOSIS — N200 Calculus of kidney: Secondary | ICD-10-CM | POA: Diagnosis not present

## 2021-08-15 DIAGNOSIS — E1165 Type 2 diabetes mellitus with hyperglycemia: Secondary | ICD-10-CM | POA: Diagnosis not present

## 2021-08-15 DIAGNOSIS — J449 Chronic obstructive pulmonary disease, unspecified: Secondary | ICD-10-CM | POA: Diagnosis not present

## 2021-08-15 DIAGNOSIS — Z8546 Personal history of malignant neoplasm of prostate: Secondary | ICD-10-CM | POA: Diagnosis not present

## 2021-08-15 LAB — URINALYSIS, ROUTINE W REFLEX MICROSCOPIC
Bacteria, UA: NONE SEEN
Bilirubin Urine: NEGATIVE
Glucose, UA: >=500 mg/dL — AB
Hgb urine dipstick: NEGATIVE
Ketones, ur: NEGATIVE mg/dL
Leukocytes,Ua: NEGATIVE
Nitrite: NEGATIVE
Protein, ur: NEGATIVE mg/dL
Specific Gravity, Urine: 1.003 — ABNORMAL LOW (ref 1.005–1.030)
Squamous Epithelial / HPF: NONE SEEN (ref 0–5)
WBC, UA: NONE SEEN WBC/hpf (ref 0–5)
pH: 5 (ref 5.0–8.0)

## 2021-08-15 LAB — CBC WITH DIFFERENTIAL/PLATELET
Abs Immature Granulocytes: 0.01 10*3/uL (ref 0.00–0.07)
Basophils Absolute: 0.1 10*3/uL (ref 0.0–0.1)
Basophils Relative: 2 %
Eosinophils Absolute: 0.4 10*3/uL (ref 0.0–0.5)
Eosinophils Relative: 7 %
HCT: 52.3 % — ABNORMAL HIGH (ref 39.0–52.0)
Hemoglobin: 17.6 g/dL — ABNORMAL HIGH (ref 13.0–17.0)
Immature Granulocytes: 0 %
Lymphocytes Relative: 36 %
Lymphs Abs: 1.9 10*3/uL (ref 0.7–4.0)
MCH: 30.9 pg (ref 26.0–34.0)
MCHC: 33.7 g/dL (ref 30.0–36.0)
MCV: 91.9 fL (ref 80.0–100.0)
Monocytes Absolute: 0.4 10*3/uL (ref 0.1–1.0)
Monocytes Relative: 7 %
Neutro Abs: 2.5 10*3/uL (ref 1.7–7.7)
Neutrophils Relative %: 48 %
Platelets: 212 10*3/uL (ref 150–400)
RBC: 5.69 MIL/uL (ref 4.22–5.81)
RDW: 12.7 % (ref 11.5–15.5)
WBC: 5.2 10*3/uL (ref 4.0–10.5)
nRBC: 0 % (ref 0.0–0.2)

## 2021-08-15 LAB — COMPREHENSIVE METABOLIC PANEL
ALT: 53 U/L — ABNORMAL HIGH (ref 0–44)
AST: 34 U/L (ref 15–41)
Albumin: 4.3 g/dL (ref 3.5–5.0)
Alkaline Phosphatase: 74 U/L (ref 38–126)
Anion gap: 8 (ref 5–15)
BUN: 15 mg/dL (ref 8–23)
CO2: 27 mmol/L (ref 22–32)
Calcium: 9.3 mg/dL (ref 8.9–10.3)
Chloride: 102 mmol/L (ref 98–111)
Creatinine, Ser: 1.15 mg/dL (ref 0.61–1.24)
GFR, Estimated: >60 mL/min (ref >60)
Glucose, Bld: 184 mg/dL — ABNORMAL HIGH (ref 70–99)
Potassium: 3.9 mmol/L (ref 3.5–5.1)
Sodium: 137 mmol/L (ref 135–145)
Total Bilirubin: 0.7 mg/dL (ref 0.3–1.2)
Total Protein: 7.7 g/dL (ref 6.5–8.1)

## 2021-08-15 MED ORDER — KETOROLAC TROMETHAMINE 30 MG/ML IJ SOLN
15.0000 mg | Freq: Once | INTRAMUSCULAR | Status: AC
  Administered 2021-08-15: 15 mg via INTRAVENOUS
  Filled 2021-08-15: qty 1

## 2021-08-15 MED ORDER — PREDNISONE 50 MG PO TABS: 50.0000 mg | ORAL_TABLET | Freq: Every day | ORAL | 0 refills | Status: AC

## 2021-08-15 MED ORDER — VALACYCLOVIR HCL 1 G PO TABS: 1000.0000 mg | ORAL_TABLET | Freq: Three times a day (TID) | ORAL | 0 refills | Status: AC

## 2021-08-15 MED ORDER — SODIUM CHLORIDE 0.9 % IV BOLUS
1000.0000 mL | Freq: Once | INTRAVENOUS | Status: AC
  Administered 2021-08-15: 1000 mL via INTRAVENOUS

## 2021-08-15 MED ORDER — ONDANSETRON HCL 4 MG/2ML IJ SOLN
4.0000 mg | Freq: Once | INTRAMUSCULAR | Status: AC
  Administered 2021-08-15: 4 mg via INTRAVENOUS
  Filled 2021-08-15: qty 2

## 2021-08-15 MED ORDER — OXYCODONE-ACETAMINOPHEN 5-325 MG PO TABS: 1.0000 | ORAL_TABLET | Freq: Four times a day (QID) | ORAL | 0 refills | Status: AC | PRN

## 2021-08-15 NOTE — ED Provider Notes (Signed)
----------------------------------------- ?  2:01 PM on 08/15/2021 ?----------------------------------------- ?I have personally seen and evaluated the patient in conjunction with physician assistant Mardee Postin.  Overall patient appears well, reassuring physical exam.  Reassuring vital signs.  Patient's lab work is reassuring as well with a normal CBC, normal chemistry including reassuring renal function.  Normal urinalysis.  CT scan showing no significant abnormality specifically no ureterolithiasis.  On my examination the patient's discomfort appears to be in his right flank and appears more superficial.  States it is sensitive and painful to touch the skin.  There is no obvious rash, however this could possibly be indicative of a early shingles versus musculoskeletal pain.  I believe it is reasonable to cover the patient with valacyclovir, prednisone and a short course of pain medication.  Have the patient follow-up with his doctor.  Patient is agreeable to this plan of care. ?  ?Harvest Dark, MD ?08/15/21 1402 ? ?

## 2021-08-15 NOTE — Discharge Instructions (Addendum)
-  Take all of your medications as prescribed. ?-You may treat pain with Tylenol/ibuprofen as needed.  Use oxycodone sparingly. ?-Return to the emergency department anytime if you begin to experience any new or worsening symptoms. ?-Follow-up with your primary care provider in regards to the incidental finding of adrenal nodule. ?

## 2021-08-15 NOTE — ED Notes (Signed)
NO signature pad in room. Pt gave verbal consent.  ?

## 2021-08-15 NOTE — ED Notes (Signed)
RN to bedside to introduce self to pt and initiate care.  ?

## 2021-08-15 NOTE — ED Provider Notes (Signed)
? ?[facility] ?Provider Note ? ? ? Event Date/Time  ? First MD Initiated Contact with Patient 08/15/21 1212   ?  (approximate) ? ? ?History  ? ?Chief Complaint ?Flank Pain ? ? ?HPI ?Devin Mayer is a 66 y.o. male, history of COPD, nephrolithiasis, chronic pain syndrome, type 2 diabetes, carotid artery stenosis, prostate cancer, hypertension, presents to the emergency department for evaluation of right-sided flank pain.  Patient states that this pain has been going on for the past 2 days.  He states that he has had kidney stones before, but this feels worse.  Additionally endorses burning sensation when he urinates.  Denies fever/chills, chest pain, shortness of breath, hematuria, vomiting, diarrhea, lightheadedness/dizziness, rashes, penile discharge, or headache. ? ?History Limitations: No limitations. ? ?  ? ? ?Physical Exam  ?Triage Vital Signs: ?ED Triage Vitals  ?Enc Vitals Group  ?   BP 08/15/21 1204 (!) 151/74  ?   Pulse Rate 08/15/21 1204 86  ?   Resp 08/15/21 1204 18  ?   Temp 08/15/21 1204 98.1 ?F (36.7 ?C)  ?   Temp Source 08/15/21 1204 Oral  ?   SpO2 08/15/21 1204 95 %  ?   Weight 08/15/21 1159 178 lb 12.7 oz (81.1 kg)  ?   Height 08/15/21 1159 '5\' 6"'$  (1.676 m)  ?   Head Circumference --   ?   Peak Flow --   ?   Pain Score 08/15/21 1158 9  ?   Pain Loc --   ?   Pain Edu? --   ?   Excl. in [place]? --   ? ? ?Most recent vital signs: ?Vitals:  ? 08/15/21 1330 08/15/21 1400  ?BP: 135/75 130/70  ?Pulse: 80 80  ?Resp: 16 16  ?Temp:    ?SpO2: 96% 96%  ? ? ?General: Awake, NAD.  ?Skin: Warm, dry.  ?CV: Good peripheral perfusion.  ?Resp: Normal effort.  Lung sounds clear bilaterally ?Abd: Soft, non-tender. No distention.  ?Neuro: At baseline. No gross neurological deficits.  ?Other: Right-sided CVA tenderness. ? ?Physical Exam ? ? ? ?ED Results / Procedures / Treatments  ?Labs ?(all labs ordered are listed, but only abnormal results are displayed) ?Labs Reviewed  ?COMPREHENSIVE METABOLIC PANEL  - Abnormal; Notable for the following components:  ?    Result Value  ? Glucose, Bld 184 (*)   ? ALT 53 (*)   ? All other components within normal limits  ?CBC WITH DIFFERENTIAL/PLATELET - Abnormal; Notable for the following components:  ? Hemoglobin 17.6 (*)   ? HCT 52.3 (*)   ? All other components within normal limits  ?URINALYSIS, ROUTINE W REFLEX MICROSCOPIC - Abnormal; Notable for the following components:  ? Color, Urine STRAW (*)   ? APPearance CLEAR (*)   ? Specific Gravity, Urine 1.003 (*)   ? Glucose, UA >=500 (*)   ? All other components within normal limits  ? ? ? ?EKG ?Not applicable. ? ? ?RADIOLOGY ? ?ED Provider Interpretation: I personally reviewed and interpreted the CT, nephrolithiasis of the left kidney present.  No evidence of nephrolithiasis on the right side. ? ?CT Renal Stone Study ? ?Result Date: 08/15/2021 ?CLINICAL DATA:  Nephrolithiasis with flank pain for 2 days. EXAM: CT ABDOMEN AND PELVIS WITHOUT CONTRAST TECHNIQUE: Multidetector CT imaging of the abdomen and pelvis was performed following the standard protocol without IV contrast. RADIATION DOSE REDUCTION: This exam was performed according to the departmental dose-optimization program which includes automated exposure control,  adjustment of the mA and/or kV according to patient size and/or use of iterative reconstruction technique. COMPARISON:  April 13, 2013. FINDINGS: Lower chest: Basilar atelectasis. Pulmonary emphysema. No effusion or consolidation. Hepatobiliary: Moderate to marked hepatic steatosis, liver parenchyma is less dense than intrahepatic vasculature. No pericholecystic stranding or gross biliary duct distension. Pancreas: Normal contours.  No signs of inflammation. Spleen: Normal. Adrenals/Urinary Tract: 3.2 cm RIGHT adrenal nodule with density of 2 Hounsfield units in the medial portion LEFT adrenal gland is normal. Nephrolithiasis of the LEFT kidney with a 3 mm interpolar calculus. 2 mm calculus of the upper pole  of the LEFT kidney. No LEFT-sided ureteral calculus. Urinary bladder under distended without perivesical stranding. No RIGHT-sided intrarenal calculi. Moderate cortical scarring of the RIGHT kidney as compared to the LEFT, substantial scarring in the upper pole. No signs of RIGHT ureteral calculi. No hydronephrosis or substantial perinephric stranding aside from mild chronic appearing stranding about the bilateral kidneys. Mildly heterogeneous as compared to previous imaging but unchanged with respect to size Stomach/Bowel: Colonic diverticulosis scattered throughout the colon mainly in descending and sigmoid, mild. Normal appendix. No perigastric stranding. Tortuous duodenum unchanged and without dilation. No small bowel dilation or perienteric stranding. Vascular/Lymphatic: Aortic atherosclerosis. No sign of aneurysm. Smooth contour of the IVC. There is no gastrohepatic or hepatoduodenal ligament lymphadenopathy. No retroperitoneal or mesenteric lymphadenopathy. No pelvic sidewall lymphadenopathy. Limited assessment due to lack of intravenous contrast. Reproductive: Brachytherapy seeds in the prostate similar to prior imaging. Other: No ascites.  No free air. Musculoskeletal: No acute bone finding. No destructive bone process. Spinal degenerative changes. IMPRESSION: 1. Nephrolithiasis of the LEFT kidney with a 3 mm interpolar calculus and 2 mm upper pole calculus. No ureteral calculi or hydronephrosis. 2. Cortical scarring involving the upper pole of the RIGHT kidney is moderate perhaps related to prior infection or obstruction. 3. Moderate to marked hepatic steatosis. 4. 3 cm RIGHT adrenal nodule is within 1 mm of its previous size. This is an adenoma which displays mild heterogeneity on today's study perhaps related small amount of bleeding into a previously uniformly hypodense adrenal adenoma. 5. Colonic diverticulosis without evidence of acute diverticulitis. 6. Pulmonary emphysema. 7. Aortic atherosclerosis.  Aortic Atherosclerosis (ICD10-I70.0) and Emphysema (ICD10-J43.9). Electronically Signed   By: Zetta Bills M.D.   On: 08/15/2021 13:16   ? ?PROCEDURES: ? ?Critical Care performed: None. ? ?Procedures ? ? ? ?MEDICATIONS ORDERED IN ED: ?Medications  ?sodium chloride 0.9 % bolus 1,000 mL (0 mLs Intravenous Stopped 08/15/21 1418)  ?ketorolac (TORADOL) 30 MG/ML injection 15 mg (15 mg Intravenous Given 08/15/21 1246)  ?ondansetron (ZOFRAN) injection 4 mg (4 mg Intravenous Given 08/15/21 1246)  ? ? ? ?IMPRESSION / MDM / ASSESSMENT AND PLAN / ED COURSE  ?I reviewed the triage vital signs and the nursing notes. ?             ?               ? ? ?Differential diagnosis includes, but is not limited to, cystitis, pyelonephritis, nephrolithiasis, cholecystitis, cholangitis, cholelithiasis, appendicitis. ? ? ?ED Course ?Patient appears well.  Vital signs within normal limits.  We will go ahead and treat with IV fluids and ketorolac. ? ?CBC shows no evidence of leukocytosis or anemia. ? ?CMP shows hyperglycemia at 184, otherwise no evidence of electrolyte abnormalities, kidney injury, or transaminitis. ? ?Urinalysis notable for elevated glucose, otherwise no signs of infection. ? ?CT renal shows nephrolithiasis of the left kidney with a 3 mm  interpolar calculus and 2 mm upper pole calculus.  No ureteral calculi.  Notably, no nephrolithiasis on the right side. ? ? ? ?Assessment/Plan ?Patient presents with right-sided flank pain.  CT does not show any evidence of nephrolithiasis, urinalysis is unremarkable.  Upon reexamination, the patient's pain does appear to be dermatomal in nature.  Patient is exquisitely sensitive to light palpation.  I suspect that the patient may potentially have a prodrome of shingles, though no notable rashes present.  We will go ahead and cover this patient with valacyclovir and prednisone.  We will also provide him with a short course of oxycodone/acetaminophen as well.  Advised him to follow-up with his  primary care provider in the next week.  ? ?Considered admission for this patient, but given the patient's stable vital signs, reassuring work-up, I do not think the patient would benefit from [facility]

## 2021-08-15 NOTE — ED Triage Notes (Signed)
C/O right flank pain x 2 days.  Denies N/V/D ?

## 2021-08-21 ENCOUNTER — Emergency Department: Payer: 59

## 2021-08-21 ENCOUNTER — Ambulatory Visit (INDEPENDENT_AMBULATORY_CARE_PROVIDER_SITE_OTHER): Payer: 59 | Admitting: Family Medicine

## 2021-08-21 ENCOUNTER — Encounter: Payer: Self-pay | Admitting: Family Medicine

## 2021-08-21 ENCOUNTER — Other Ambulatory Visit: Payer: Self-pay

## 2021-08-21 ENCOUNTER — Encounter: Payer: Self-pay | Admitting: *Deleted

## 2021-08-21 ENCOUNTER — Emergency Department
Admission: EM | Admit: 2021-08-21 | Discharge: 2021-08-21 | Disposition: A | Discharge status: Home / Self Care | Payer: 59 | Attending: Emergency Medicine | Admitting: Emergency Medicine

## 2021-08-21 VITALS — BP 130/76 | HR 88 | Ht 66.0 in | Wt 182.0 lb

## 2021-08-21 DIAGNOSIS — J449 Chronic obstructive pulmonary disease, unspecified: Secondary | ICD-10-CM | POA: Diagnosis not present

## 2021-08-21 DIAGNOSIS — F17200 Nicotine dependence, unspecified, uncomplicated: Secondary | ICD-10-CM | POA: Insufficient documentation

## 2021-08-21 DIAGNOSIS — E1165 Type 2 diabetes mellitus with hyperglycemia: Secondary | ICD-10-CM | POA: Insufficient documentation

## 2021-08-21 DIAGNOSIS — R1011 Right upper quadrant pain: Secondary | ICD-10-CM | POA: Diagnosis not present

## 2021-08-21 DIAGNOSIS — E1140 Type 2 diabetes mellitus with diabetic neuropathy, unspecified: Secondary | ICD-10-CM | POA: Insufficient documentation

## 2021-08-21 DIAGNOSIS — Z8546 Personal history of malignant neoplasm of prostate: Secondary | ICD-10-CM | POA: Diagnosis not present

## 2021-08-21 DIAGNOSIS — K760 Fatty (change of) liver, not elsewhere classified: Secondary | ICD-10-CM | POA: Insufficient documentation

## 2021-08-21 DIAGNOSIS — R140 Abdominal distension (gaseous): Secondary | ICD-10-CM | POA: Diagnosis not present

## 2021-08-21 DIAGNOSIS — I10 Essential (primary) hypertension: Secondary | ICD-10-CM | POA: Insufficient documentation

## 2021-08-21 LAB — COMPREHENSIVE METABOLIC PANEL
ALT: 58 U/L — ABNORMAL HIGH (ref 0–44)
AST: 37 U/L (ref 15–41)
Albumin: 4.2 g/dL (ref 3.5–5.0)
Alkaline Phosphatase: 82 U/L (ref 38–126)
Anion gap: 11 (ref 5–15)
BUN: 23 mg/dL (ref 8–23)
CO2: 25 mmol/L (ref 22–32)
Calcium: 9.3 mg/dL (ref 8.9–10.3)
Chloride: 96 mmol/L — ABNORMAL LOW (ref 98–111)
Creatinine, Ser: 1.35 mg/dL — ABNORMAL HIGH (ref 0.61–1.24)
GFR, Estimated: 59 mL/min — ABNORMAL LOW (ref >60)
Glucose, Bld: 452 mg/dL — ABNORMAL HIGH (ref 70–99)
Potassium: 4.8 mmol/L (ref 3.5–5.1)
Sodium: 132 mmol/L — ABNORMAL LOW (ref 135–145)
Total Bilirubin: 0.8 mg/dL (ref 0.3–1.2)
Total Protein: 7.9 g/dL (ref 6.5–8.1)

## 2021-08-21 LAB — URINALYSIS, COMPLETE (UACMP) WITH MICROSCOPIC
Bacteria, UA: NONE SEEN
Bilirubin Urine: NEGATIVE
Glucose, UA: >=500 mg/dL — AB
Hgb urine dipstick: NEGATIVE
Ketones, ur: NEGATIVE mg/dL
Leukocytes,Ua: NEGATIVE
Nitrite: NEGATIVE
Protein, ur: NEGATIVE mg/dL
Specific Gravity, Urine: 1.024 (ref 1.005–1.030)
Squamous Epithelial / HPF: NONE SEEN (ref 0–5)
pH: 5 (ref 5.0–8.0)

## 2021-08-21 LAB — CBC
HCT: 50.7 % (ref 39.0–52.0)
Hemoglobin: 17.4 g/dL — ABNORMAL HIGH (ref 13.0–17.0)
MCH: 31.4 pg (ref 26.0–34.0)
MCHC: 34.3 g/dL (ref 30.0–36.0)
MCV: 91.4 fL (ref 80.0–100.0)
Platelets: 241 10*3/uL (ref 150–400)
RBC: 5.55 MIL/uL (ref 4.22–5.81)
RDW: 13 % (ref 11.5–15.5)
WBC: 7.4 10*3/uL (ref 4.0–10.5)
nRBC: 0 % (ref 0.0–0.2)

## 2021-08-21 LAB — LIPASE, BLOOD: Lipase: 48 U/L (ref 11–51)

## 2021-08-21 MED ORDER — KETOROLAC TROMETHAMINE 15 MG/ML IJ SOLN
15.0000 mg | Freq: Once | INTRAMUSCULAR | Status: AC
  Administered 2021-08-21: 15 mg via INTRAMUSCULAR
  Filled 2021-08-21: qty 1

## 2021-08-21 NOTE — Patient Instructions (Addendum)
Thank you for coming to the office today. ? ?Go back to hospital and follow-up with their evaluation. We can contact them. Due to persistent abdominal pain with distention. ? ? ?Please schedule a Follow-up Appointment to: Return if symptoms worsen or fail to improve. ? ?If you have any other questions or concerns, please feel free to call the office or send a message through [facility]. You may also schedule an earlier appointment if necessary. ? ?Additionally, you may be receiving a survey about your experience at our office within a few days to 1 week by e-mail or mail. We value your feedback. ? ?Nobie Putnam, DO ?[facility] ?

## 2021-08-21 NOTE — Discharge Instructions (Addendum)
Other than your elevated blood sugar, your blood work was reassuring including your liver function test.  The ultrasound of your gallbladder did not show any significant abnormality that would be causing your pain.  Liver does appear fatty.  Please follow-up with the pain clinic as well as your primary care provider.  If you develop any new symptoms that are concerning to you such as fever, chest pain, or shortness of breath, please return to the emergency department.  ?

## 2021-08-21 NOTE — Progress Notes (Signed)
? ?Subjective:  ? ? Patient ID: Devin Mayer, male    DOB: 1956-04-19, 66 y.o.   MRN: [id number] ? ?Devin Mayer is a 66 y.o. male presenting on 08/21/2021 for Abdominal Pain ? ? ?HPI ? ?ED FOLLOW-UP VISIT ? ?Hospital/Location: [facility] ?Date of ED Visit: 08/15/21 ? ?Reason for Presenting to ED: R abdominal RUQ vs Flank pain ? ?FOLLOW-UP  ?- ED provider note and record have been reviewed ?- Patient presents today about 6 days after recent ED visit. Brief summary of recent course, patient had symptoms of R upper RUQ abdomen and flank pain for past 1-2 months, unsure exact onset, worse in past 2 weeks, presented to ED, testing in ED with CT renal study evaluating for stones given his history, negative for R side, he had small x 2 nephrolithiasis, urinalysis no blood, no leuks, labs unremarkable. They thought maybe shingles but no rash. He was given Prednisone and Oxycodone PRN pain, and valtrex but still no rash he did not take that. ? ?- Today reports overall has not improved. Still has same pain RUQ Abdomen with distention. ? ?He has not passed kidney stone. No rash has developed. No blood in urine. No fever. ?Tolerating some PO but only eating 1 meal per day. Has some urinary frequency. ?Admits nausea vomiting  ?Denies bowel problems, dark stool or blood. ? ?He finished the pain medicine oxycodone PRN but still has pain. ?Took prednisone. Did not help ?Did not take valtrex ? ?I have reviewed the discharge medication list, and have reconciled the current and discharge medications today. ? ? ? ? ?  08/21/2021  ?  2:10 PM 07/02/2021  ?  2:48 PM 06/04/2021  ?  9:07 AM  ?Depression screen PHQ 2/9  ?Decreased Interest 0 0 0  ?Down, Depressed, Hopeless 3 0 0  ?PHQ - 2 Score 3 0 0  ?Altered sleeping 0 1   ?Tired, decreased energy 0 2   ?Change in appetite 0 1   ?Feeling bad or failure about yourself  1 0   ?Trouble concentrating 3 0   ?Moving slowly or fidgety/restless 0 0   ?Suicidal thoughts 0 0   ?PHQ-9 Score 7 4   ?Difficult  doing work/chores Somewhat difficult Not difficult at all   ? ? ?Social History  ? ?Tobacco Use  ? Smoking status: Every Day  ?  Types: Cigars  ? Smokeless tobacco: Never  ?Substance Use Topics  ? Alcohol use: Yes  ? Drug use: Never  ? ? ?Review of Systems ?Per HPI unless specifically indicated above ? ?   ?Objective:  ?  ?BP 130/76   Pulse 88   Ht '5\' 6"'$  (1.676 m)   Wt 182 lb (82.6 kg)   SpO2 97%   BMI 29.38 kg/m?   ?Wt Readings from Last 3 Encounters:  ?08/21/21 182 lb (82.6 kg)  ?08/15/21 178 lb 12.7 oz (81.1 kg)  ?07/02/21 178 lb 12.8 oz (81.1 kg)  ?  ?Physical Exam ?Vitals and nursing note reviewed.  ?Constitutional:   ?   General: He is not in acute distress. ?   Appearance: He is well-developed. He is not diaphoretic.  ?   Comments: Well-appearing, comfortable, cooperative  ?HENT:  ?   Head: Normocephalic and atraumatic.  ?Eyes:  ?   General:     ?   Right eye: No discharge.     ?   Left eye: No discharge.  ?   Conjunctiva/sclera: Conjunctivae normal.  ?Neck:  ?  Thyroid: No thyromegaly.  ?Cardiovascular:  ?   Rate and Rhythm: Normal rate and regular rhythm.  ?   Pulses: Normal pulses.  ?   Heart sounds: Normal heart sounds. No murmur heard. ?Pulmonary:  ?   Effort: Pulmonary effort is normal. No respiratory distress.  ?   Breath sounds: Normal breath sounds. No wheezing or rales.  ?Abdominal:  ?   General: Bowel sounds are normal. There is distension.  ?   Palpations: Abdomen is soft. There is no hepatomegaly or mass.  ?   Tenderness: There is abdominal tenderness in the right upper quadrant. There is no guarding or rebound. Negative signs include Murphy's sign and McBurney's sign.  ?   Hernia: No hernia is present.  ?Musculoskeletal:     ?   General: Normal range of motion.  ?   Cervical back: Normal range of motion and neck supple.  ?Lymphadenopathy:  ?   Cervical: No cervical adenopathy.  ?Skin: ?   General: Skin is warm and dry.  ?   Findings: No erythema or rash.  ?Neurological:  ?   Mental Status:  He is alert and oriented to person, place, and time. Mental status is at baseline.  ?Psychiatric:     ?   Behavior: Behavior normal.  ?   Comments: Well groomed, good eye contact, normal speech and thoughts  ? ? ?I have personally reviewed the radiology report from 08/15/21 CT Renal. ? ?CLINICAL DATA:  Nephrolithiasis with flank pain for 2 days. ?  ?EXAM: ?CT ABDOMEN AND PELVIS WITHOUT CONTRAST ?  ?TECHNIQUE: ?Multidetector CT imaging of the abdomen and pelvis was performed ?following the standard protocol without IV contrast. ?  ?RADIATION DOSE REDUCTION: This exam was performed according to the ?departmental dose-optimization program which includes automated ?exposure control, adjustment of the mA and/or kV according to ?patient size and/or use of iterative reconstruction technique. ?  ?COMPARISON:  April 13, 2013. ?  ?FINDINGS: ?Lower chest: Basilar atelectasis. Pulmonary emphysema. No effusion ?or consolidation. ?  ?Hepatobiliary: Moderate to marked hepatic steatosis, liver ?parenchyma is less dense than intrahepatic vasculature. No ?pericholecystic stranding or gross biliary duct distension. ?  ?Pancreas: Normal contours.  No signs of inflammation. ?  ?Spleen: Normal. ?  ?Adrenals/Urinary Tract: 3.2 cm RIGHT adrenal nodule with density of ?2 Hounsfield units in the medial portion LEFT adrenal gland is ?normal. ?  ?Nephrolithiasis of the LEFT kidney with a 3 mm interpolar calculus. ?2 mm calculus of the upper pole of the LEFT kidney. No LEFT-sided ?ureteral calculus. Urinary bladder under distended without ?perivesical stranding. ?  ?No RIGHT-sided intrarenal calculi. Moderate cortical scarring of the ?RIGHT kidney as compared to the LEFT, substantial scarring in the ?upper pole. No signs of RIGHT ureteral calculi. No hydronephrosis ?or substantial perinephric stranding aside from mild chronic ?appearing stranding about the bilateral kidneys. Mildly ?heterogeneous as compared to previous imaging but unchanged  with ?respect to size ?  ?Stomach/Bowel: Colonic diverticulosis scattered throughout the colon ?mainly in descending and sigmoid, mild. Normal appendix. ?  ?No perigastric stranding. Tortuous duodenum unchanged and without ?dilation. No small bowel dilation or perienteric stranding. ?  ?Vascular/Lymphatic: ?  ?Aortic atherosclerosis. No sign of aneurysm. Smooth contour of the ?IVC. There is no gastrohepatic or hepatoduodenal ligament ?lymphadenopathy. No retroperitoneal or mesenteric lymphadenopathy. ?  ?No pelvic sidewall lymphadenopathy. ?  ?Limited assessment due to lack of intravenous contrast. ?  ?Reproductive: Brachytherapy seeds in the prostate similar to prior ?imaging. ?  ?Other: No ascites.  No  free air. ?  ?Musculoskeletal: No acute bone finding. No destructive bone process. ?Spinal degenerative changes. ?  ?IMPRESSION: ?1. Nephrolithiasis of the LEFT kidney with a 3 mm interpolar ?calculus and 2 mm upper pole calculus. No ureteral calculi or ?hydronephrosis. ?2. Cortical scarring involving the upper pole of the RIGHT kidney is ?moderate perhaps related to prior infection or obstruction. ?3. Moderate to marked hepatic steatosis. ?4. 3 cm RIGHT adrenal nodule is within 1 mm of its previous size. ?This is an adenoma which displays mild heterogeneity on today's ?study perhaps related small amount of bleeding into a previously ?uniformly hypodense adrenal adenoma. ?5. Colonic diverticulosis without evidence of acute diverticulitis. ?6. Pulmonary emphysema. ?7. Aortic atherosclerosis. ?  ?Aortic Atherosclerosis (ICD10-I70.0) and Emphysema (ICD10-J43.9). ?  ?  ?Electronically Signed ?  By: Zetta Bills M.D. ?  On: 08/15/2021 13:16 ? ? ?Results for orders placed or performed during the hospital encounter of 08/15/21  ?Comprehensive metabolic panel  ?Result Value Ref Range  ? Sodium 137 135 - 145 mmol/L  ? Potassium 3.9 3.5 - 5.1 mmol/L  ? Chloride 102 98 - 111 mmol/L  ? CO2 27 22 - 32 mmol/L  ? Glucose, Bld  184 (H) 70 - 99 mg/dL  ? BUN 15 8 - 23 mg/dL  ? Creatinine, Ser 1.15 0.61 - 1.24 mg/dL  ? Calcium 9.3 8.9 - 10.3 mg/dL  ? Total Protein 7.7 6.5 - 8.1 g/dL  ? Albumin 4.3 3.5 - 5.0 g/dL  ? AST 34 15 - 41 U/L

## 2021-08-21 NOTE — ED Triage Notes (Addendum)
Pt has right abd pain.  Sx for 2 weeks.  No n/v/d.  Pt was sent from pmd today for eval.  Pt alert  speech clear.  Hx copd ?

## 2021-08-21 NOTE — ED Notes (Signed)
Patient transported to Ultrasound 

## 2021-08-21 NOTE — ED Provider Notes (Signed)
? ?[facility] [place] ?Provider Note ? ? ? Event Date/Time  ? First MD Initiated Contact with Patient 08/21/21 1803   ?  (approximate) ? ? ?History  ? ?Abdominal Pain ? ? ?HPI ? ?Reichen Hutzler is a 66 y.o. male with past medical history of diabetes, hypertension hyperlipidemia presents with right-sided abdominal pain.  Started about 2 weeks ago.  Pain is located in the right upper abdomen and right flank.  Denies associated nausea vomiting diarrhea.  He is tolerating p.o. and pain is not worse after eating.  Denies urinary symptoms.  Denies fevers chills.  Patient was seen in the ED last week had a CT renal study that was negative reassuring labs.  Followed up with his PCP and was referred today to the ED given concern for potential liver problems.  No history of prior abdominal surgeries. ?  ? ?Past Medical History:  ?Diagnosis Date  ? Cancer [facility])   ? COPD (chronic obstructive pulmonary disease) ([place])   ? Diabetes mellitus without complication ([place])   ? Hyperlipidemia   ? Hypertension   ? Stroke [facility])   ? ? ?Patient Active Problem List  ? Diagnosis Date Noted  ? 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 Type 2 diabetes mellitus with diabetic neuropathy, with long-term current use of insulin ([place]) 03/05/2021  ? Chronic radicular lumbar pain 03/04/2021  ? Other intervertebral disc degeneration, lumbar region 03/04/2021  ? 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/04/2021  ? 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 Chronic pain syndrome 03/04/2021  ? Bilateral carotid artery stenosis 08/01/2020  ? Intestinal metaplasia of gastric mucosa 05/14/2020  ? Helicobacter pylori infection 05/14/2020  ? Hematochezia 11/22/2019  ? Hypertension 10/25/2019  ? Adrenal mass 1 cm to 4 cm in diameter ([place]) 10/19/2019  ? Nephrolithiasis 10/19/2019  ? Volume overload 10/19/2019  ? Vertigo 03/12/2014  ? Pre-diabetes 02/02/2014  ? Prostate cancer ([place]) 02/02/2014  ? Subcutaneous  nodule 08/09/2013  ? Acute bronchitis 08/08/2013  ? Influenza B 08/08/2013  ? Smoking 08/08/2013  ? ? ? ?Physical Exam  ?Triage Vital Signs: ?ED Triage Vitals  ?Enc Vitals Group  ?   BP 08/21/21 1629 (!) 152/75  ?   Pulse Rate 08/21/21 1629 99  ?   Resp 08/21/21 1629 18  ?   Temp 08/21/21 1629 98.4 ?F (36.9 ?C)  ?   Temp Source 08/21/21 1629 Oral  ?   SpO2 08/21/21 1629 91 %  ?   Weight 08/21/21 1631 180 lb (81.6 kg)  ?   Height 08/21/21 1631 '5\' 6"'$  (1.676 m)  ?   Head Circumference --   ?   Peak Flow --   ?   Pain Score 08/21/21 1631 10  ?   Pain Loc --   ?   Pain Edu? --   ?   Excl. in [place]? --   ? ? ?Most recent vital signs: ?Vitals:  ? 08/21/21 1629 08/21/21 1901  ?BP: (!) 152/75 127/74  ?Pulse: 99 74  ?Resp: 18 19  ?Temp: 98.4 ?F (36.9 ?C)   ?SpO2: 91% 92%  ? ? ? ?General: Awake, no distress.  ?CV:  Good peripheral perfusion.  ?Resp:  Normal effort.  ?Abd:  No distention.  Mild tenderness to palpation in the right upper quadrant, no guarding ?Neuro:             Awake, Alert, Oriented x 3  ?Other:   ? ? ?ED Results / Procedures / Treatments  ?  Labs ?(all labs ordered are listed, but only abnormal results are displayed) ?Labs Reviewed  ?COMPREHENSIVE METABOLIC PANEL - Abnormal; Notable for the following components:  ?    Result Value  ? Sodium 132 (*)   ? Chloride 96 (*)   ? Glucose, Bld 452 (*)   ? Creatinine, Ser 1.35 (*)   ? ALT 58 (*)   ? GFR, Estimated 59 (*)   ? All other components within normal limits  ?CBC - Abnormal; Notable for the following components:  ? Hemoglobin 17.4 (*)   ? All other components within normal limits  ?URINALYSIS, COMPLETE (UACMP) WITH MICROSCOPIC - Abnormal; Notable for the following components:  ? Color, Urine STRAW (*)   ? APPearance CLEAR (*)   ? Glucose, UA >=500 (*)   ? All other components within normal limits  ?LIPASE, BLOOD  ? ? ? ?EKG ? ? ? ? ?RADIOLOGY ? ? ? ?PROCEDURES: ? ?Critical Care performed: No ? ?Procedures ? ?The patient is on the cardiac monitor to evaluate for  evidence of arrhythmia and/or significant heart rate changes. ? ? ?MEDICATIONS ORDERED IN ED: ?Medications  ?ketorolac (TORADOL) 15 MG/ML injection 15 mg (15 mg Intramuscular Given 08/21/21 1932)  ? ? ? ?IMPRESSION / MDM / ASSESSMENT AND PLAN / ED COURSE  ?I reviewed the triage vital signs and the nursing notes. ?             ?               ? ?Differential diagnosis includes, but is not limited to, cholelithiasis, cholecystitis, pancreatitis, less likely appendicitis, kidney stone ? ?Is a 66 year old male with right upper quadrant/right flank pain.  Seen in the ED several days ago with negative CT renal study.  Patient has ongoing pain really no associated symptoms including no fevers urinary symptoms nausea vomiting or diarrhea.  Vital signs within normal limits.  Labs are notable for hyperglycemia but there is no evidence of DKA.  Normal LFTs and lipase.  Plan to obtain a right upper quadrant ultrasound to further evaluate for gallstones or cholecystitis.  Given the reassuring CT study with no change in the quality of pain my concern for appendicitis or any stone is low.  Treat pain with Toradol. ? ?Gallbladder ultrasound is essentially negative.  Fatty liver is noted.  LFTs are normal as is lipase.  Unclear what the source of his flank/abdominal pain is but suspect could be musculoskeletal.  Consider pulmonary embolism or other intrathoracic process but patient is not short of breath and is not pleuritic feel this is less likely.  Will discharge with Lidoderm patch and advised NSAIDs and Tylenol as well as PCP and pain clinic follow-up. ?  ? ? ?FINAL CLINICAL IMPRESSION(S) / ED DIAGNOSES  ? ?Final diagnoses:  ?RUQ pain  ? ? ? ?Rx / DC Orders  ? ?ED Discharge Orders   ? ? None  ? ?  ? ? ? ?Note:  This document was prepared using Dragon voice recognition software and may include unintentional dictation errors. ?  ?Rada Hay, MD ?08/21/21 2119 ? ?

## 2021-08-27 ENCOUNTER — Encounter: Payer: Self-pay | Admitting: Student in an Organized Health Care Education/Training Program

## 2021-09-02 ENCOUNTER — Telehealth: Payer: Self-pay | Admitting: Student in an Organized Health Care Education/Training Program

## 2021-09-02 NOTE — Telephone Encounter (Signed)
Patient was given a 3 months supply on 07/03/2021. Should not need a refill until 10/01/2021. Patient called and informed. Wants to make an appointment to come in and discuss pain and medications. Call sent up front to schedule. ?

## 2021-09-02 NOTE — Telephone Encounter (Signed)
Patient states they called pharmacy about Tizanidine and was told there are no scripts there for him. Please call pharmacy and check. Call patient to advise ?

## 2021-09-03 ENCOUNTER — Ambulatory Visit: Payer: Medicaid Other | Admitting: Student in an Organized Health Care Education/Training Program

## 2021-09-30 ENCOUNTER — Encounter: Payer: Self-pay | Admitting: Specialist

## 2021-09-30 ENCOUNTER — Telehealth: Payer: Self-pay | Admitting: *Deleted

## 2021-09-30 ENCOUNTER — Encounter: Payer: Self-pay | Admitting: Student in an Organized Health Care Education/Training Program

## 2021-09-30 ENCOUNTER — Encounter: Payer: Self-pay | Admitting: Family Medicine

## 2021-09-30 DIAGNOSIS — G894 Chronic pain syndrome: Secondary | ICD-10-CM

## 2021-09-30 NOTE — Telephone Encounter (Signed)
Asking for Tizanidine. Last prescribed 07-01-21. Cannot do a Rx request. ?

## 2021-10-01 ENCOUNTER — Other Ambulatory Visit: Payer: Self-pay | Admitting: Student in an Organized Health Care Education/Training Program

## 2021-10-01 ENCOUNTER — Ambulatory Visit: Payer: Medicaid Other | Admitting: Specialist

## 2021-10-01 MED ORDER — TIZANIDINE HCL 4 MG PO CAPS: 4.0000 mg | ORAL_CAPSULE | Freq: Three times a day (TID) | ORAL | 2 refills | Status: AC | PRN

## 2021-10-01 NOTE — Telephone Encounter (Signed)
Patient aware of Rx being sent.  ?

## 2021-10-02 ENCOUNTER — Encounter: Payer: Self-pay | Admitting: Student in an Organized Health Care Education/Training Program

## 2021-10-02 ENCOUNTER — Ambulatory Visit: Payer: 59 | Admitting: Family Medicine

## 2021-10-03 ENCOUNTER — Telehealth: Payer: Self-pay | Admitting: *Deleted

## 2021-10-03 NOTE — Telephone Encounter (Signed)
Called [facility] and they do have the zanaflex.  However, they have sent a request to have it changed from capsules to tablets since this is what his insurance will cover.  Pharmacist took a verbal to change to tablet form.  They will get this ready to fill and let Devin Mayer know. ?

## 2021-10-27 ENCOUNTER — Telehealth: Payer: Self-pay | Admitting: Family Medicine

## 2021-10-27 ENCOUNTER — Other Ambulatory Visit: Payer: Self-pay | Admitting: Student in an Organized Health Care Education/Training Program

## 2021-10-27 ENCOUNTER — Other Ambulatory Visit: Payer: Self-pay | Admitting: Family Medicine

## 2021-10-27 DIAGNOSIS — F5101 Primary insomnia: Secondary | ICD-10-CM

## 2021-10-27 NOTE — Telephone Encounter (Signed)
Medication Refill - Medication:  clonazePAM (KLONOPIN) 0.5 MG tablet 10 tablet 5 07/02/2021  Wanted to tell Dr Raliegh Ip could increase dose, not sleeping well at all, is completely out  this of med  Has the patient contacted their pharmacy? Yes.   (Agent: If no, request that the patient contact the pharmacy for the refill. If patient does not wish to contact the pharmacy document the reason why and proceed with request.) (Agent: If yes, when and what did the pharmacy advise?) call dr  Preferred Pharmacy (with phone number or street name):  [id number] [place] [place] [address] [place] Phone: [phone]: [phone]  Has the patient been seen for an appointment in the last year OR does the patient have an upcoming appointment? Yes.    Agent: Please be advised that RX refills may take up to 3 business days. We ask that you follow-up with your pharmacy.

## 2021-10-28 NOTE — Telephone Encounter (Signed)
It has been >4 months since last visit for this med refill. Can you schedule him to return can do Virtual Phone if need to just discuss it that is fine or can do in person.  I have a few questions and would like to discuss the med and dosage / pills prior to re ordering so I can help him better.  Nobie Putnam, [facility] 10/28/2021, 1:05 PM

## 2021-10-28 NOTE — Telephone Encounter (Signed)
Requested medication (s) are due for refill today: Yes  Requested medication (s) are on the active medication list: Yes  Last refill:  07/02/21  Future visit scheduled: No  Notes to clinic:  See request.    Requested Prescriptions  Pending Prescriptions Disp Refills   clonazePAM (KLONOPIN) 0.5 MG tablet [Pharmacy Med Name: CLONAZEPAM 0.5 MG TABLET] 10 tablet     Sig: Take 1 tablet (0.5 mg total) by mouth at bedtime as needed.     Not Delegated - Psychiatry: Anxiolytics/Hypnotics 2 Failed - 10/27/2021  5:06 PM      Failed - This refill cannot be delegated      Failed - Urine Drug Screen completed in last 360 days      Passed - Patient is not pregnant      Passed - Valid encounter within last 6 months    Recent Outpatient Visits           2 months ago RUQ abdominal pain   [place], [place]   3 months ago Type 2 diabetes mellitus with diabetic neuropathy, with long-term current use of insulin [facility])   [facility] Olin Hauser, DO   7 months ago [place], [place]

## 2021-10-28 NOTE — Telephone Encounter (Signed)
Left messaged asking the patient to call back and make an appt to come in to get anything prescribed since it has been a while since he has been seen.

## 2021-11-06 ENCOUNTER — Ambulatory Visit: Payer: Medicaid Other | Admitting: Specialist

## 2021-11-11 ENCOUNTER — Encounter: Payer: Medicare PPO | Admitting: Student in an Organized Health Care Education/Training Program

## 2021-11-20 ENCOUNTER — Encounter: Payer: Self-pay | Admitting: Family Medicine

## 2021-11-20 ENCOUNTER — Ambulatory Visit
Payer: Medicare PPO | Attending: Student in an Organized Health Care Education/Training Program | Admitting: Student in an Organized Health Care Education/Training Program

## 2021-11-20 ENCOUNTER — Encounter: Payer: Self-pay | Admitting: Student in an Organized Health Care Education/Training Program

## 2021-11-20 VITALS — BP 135/86 | HR 103 | Temp 97.0°F | Resp 18 | Ht 66.0 in | Wt 175.0 lb

## 2021-11-20 DIAGNOSIS — G894 Chronic pain syndrome: Secondary | ICD-10-CM | POA: Insufficient documentation

## 2021-11-20 DIAGNOSIS — M47816 Spondylosis without myelopathy or radiculopathy, lumbar region: Secondary | ICD-10-CM | POA: Diagnosis present

## 2021-11-20 NOTE — Patient Instructions (Signed)
Bring a driver

## 2021-11-20 NOTE — Progress Notes (Signed)
Safety precautions to be maintained throughout the outpatient stay will include: orient to surroundings, keep bed in low position, maintain call bell within reach at all times, provide assistance with transfer out of bed and ambulation.  

## 2021-11-20 NOTE — Progress Notes (Signed)
PROVIDER NOTE: Information contained herein reflects review and annotations entered in association with encounter. Interpretation of such information and data should be left to medically-trained personnel. Information provided to patient can be located elsewhere in the medical record under "Patient Instructions". Document created using STT-dictation technology, any transcriptional errors that may result from process are unintentional.    Patient: Devin Mayer  Service Category: E/M  Provider: Gillis Santa, MD  DOB: 06-24-1955  DOS: 11/20/2021  Specialty: Interventional Pain Management  MRN: [id number]  Setting: Ambulatory outpatient  PCP: Olin Hauser, DO  Type: Established Patient    Referring Provider: Nobie Putnam *  Location: Office  Delivery: Face-to-face     HPI  [name], a 66 y.o. year old male, is here today because of his Lumbar facet arthropathy [M47.816]. Devin Mayer primary complain today is Back Pain Last encounter: My last encounter with him was on 10/27/2021. Pertinent problems: 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 Pain Assessment: Severity of Chronic pain is reported as a 9 /10. Location: Back Lower/radiates into left groin then inside of leg to knee then to foot, toes tingling and claf is numb. Onset: More than a month ago. Quality: Aching, Sharp, Numbness, Tingling. Timing: Constant. Modifying factor(s): nothing. Vitals:  height is '5\' 6"'  (1.676 m) and weight is 175 lb (79.4 kg). His temperature is 97 F (36.1 C) (abnormal). His blood pressure is 135/86 and his pulse is 103 (abnormal). His respiration is 18 and oxygen saturation is 94%.   Reason for encounter: evaluation of worsening, or previously known (established)  problem.  Patient follows up today for persistent low back pain with radiation into his lower buttock at times.  He was evaluated by Dr. Cari Caraway with neurosurgery.  Surgery was not recommended however Dr. Cari Caraway did recommend facet medial branch nerve injections and possible radiofrequency ablation to help target his low back pain.  Patient is here to discuss that.    ROS  Constitutional: Denies any fever or chills Gastrointestinal: No reported hemesis, hematochezia, vomiting, or acute GI distress Musculoskeletal:  +low back pain Neurological: No reported episodes of acute onset apraxia, aphasia, dysarthria, agnosia, amnesia, paralysis, loss of coordination, or loss of consciousness  Medication Review  Aspirin-Caffeine, albuterol, clonazePAM, diclofenac, famotidine, gabapentin, insulin glargine, metFORMIN, nortriptyline, pantoprazole, potassium chloride SA, pravastatin, sitaGLIPtin, tiZANidine, and triamcinolone cream  History Review  Allergy: Devin Mayer is allergic to latex. Drug: Devin Mayer  reports no history of drug use. Alcohol:  reports current alcohol use. Tobacco:  reports that he has been smoking cigars. He has never used smokeless tobacco. Social: Devin Mayer  reports that he has been smoking cigars. He has never used smokeless tobacco. He reports current alcohol use. He reports that he does not use drugs. Medical:  has a past medical history of Cancer ([place]), COPD (chronic obstructive pulmonary disease) ([place]), Diabetes mellitus without complication ([place]), Hyperlipidemia, Hypertension, and Stroke ([place]). Surgical: Devin Mayer  has a past surgical history that includes Shoulder surgery (Bilateral) and Cholecystectomy. Family: family history includes Diabetes in his father; Heart attack in his father and paternal uncle; Lung cancer in his maternal grandmother and paternal grandmother.  Laboratory Chemistry Profile   Renal Lab Results  Component Value Date   BUN 23 08/21/2021    CREATININE 1.35 (H) 08/21/2021   LABCREA  61 09/62/8366   BCR NOT APPLICABLE 29/47/6546   GFRAA >60 12/19/2016   GFRNONAA 59 (L) 08/21/2021    Hepatic Lab Results  Component Value Date   AST 37 08/21/2021   ALT 58 (H) 08/21/2021   ALBUMIN 4.2 08/21/2021   ALKPHOS 82 08/21/2021   LIPASE 48 08/21/2021    Electrolytes Lab Results  Component Value Date   NA 132 (L) 08/21/2021   K 4.8 08/21/2021   CL 96 (L) 08/21/2021   CALCIUM 9.3 08/21/2021    Bone No results found for: "VD25OH", "VD125OH2TOT", "TK3546FK8", "LE7517GY1", "25OHVITD1", "25OHVITD2", "25OHVITD3", "TESTOFREE", "TESTOSTERONE"  Inflammation (CRP: Acute Phase) (ESR: Chronic Phase) Lab Results  Component Value Date   CRP 2.3 (H) 07/10/2020   ESRSEDRATE 7 07/10/2020         Note: Above Lab results reviewed.  Recent Imaging Review  US ABDOMEN LIMITED RUQ (LIVER/GB) CLINICAL DATA:  RIGHT upper quadrant pain for 2 weeks.  EXAM: ULTRASOUND ABDOMEN LIMITED RIGHT UPPER QUADRANT  COMPARISON:  CT of the abdomen and pelvis August 15, 2021.  FINDINGS: Gallbladder:  No reported tenderness over the gallbladder, no gallbladder wall thickening or pericholecystic fluid. Multiple small polypoid excrescences along the gallbladder wall largest approximately 4 mm, some clearly echogenic and suggestion of ring down artifact. No cholelithiasis.  Common bile duct:  Diameter: 3.6 mm, no visible intrahepatic biliary duct distension.  Liver:  Markedly echogenic liver limits assessment of the even the gallbladder on the current study due to degree of hepatic steatosis. On previous imaging the liver was less dense even than hepatic vasculature. No visible lesion in the liver accounting for marked limitation due to this factor. Portal vein is patent on color Doppler imaging with normal direction of blood flow towards the liver.  Other: No ascites.  IMPRESSION: Small polyps or adenomyomatosis along the gallbladder  wall. Assessment limited due to degree of steatosis in the adjacent liver.  No signs of biliary duct distension or acute biliary process.  Electronically Signed   By: Zetta Bills M.D.   On: 08/21/2021 20:44 Imaging: MRI L spine 04/30/21 L3-L4: Mild disc desiccation with minimal annular disc bulge and  shallow right extraforaminal disc protrusion with annular fissure.  Slight mass effect upon the exited right L3 nerve root. Slight  bilateral foraminal narrowing, left slightly worse than right. No  canal stenosis.   L4-L5: Mild diffuse disc bulge with right foraminal protrusion with  annular fissure. Mild bilateral facet hypertrophy. Mild right  foraminal stenosis. No canal stenosis.   L5-S1: Unremarkable.   IMPRESSION:  1. Mild lumbar spondylosis as described above.  2. At L3-L4, a shallow right extraforaminal disc protrusion with  annular fissure results in slight mass effect upon the exited right  L3 nerve root. Slight bilateral foraminal narrowing.  3. At L4-L5, a right foraminal disc protrusion with annular fissure  contributes to mild right foraminal stenosis.  4. No canal stenosis at any level.    Note: Reviewed        Physical Exam  General appearance: Well nourished, well developed, and well hydrated. In no apparent acute distress Mental status: Alert, oriented x 3 (person, place, & time)       Respiratory: No evidence of acute respiratory distress Eyes: PERLA Vitals: BP 135/86   Pulse (!) 103   Temp (!) 97 F (36.1 C)   Resp 18   Ht '5\' 6"'  (1.676 m)   Wt 175 lb (79.4 kg)   SpO2 94%   BMI 28.25 kg/m  BMI: Estimated body mass index is 28.25 kg/m as calculated from the following:   Height as of this encounter: '5\' 6"'  (1.676 m).   Weight as of this encounter: 175 lb (79.4 kg). Ideal: Ideal body weight: 63.8 kg (140 lb 10.5 oz) Adjusted ideal body weight: 70 kg (154 lb 6.3 oz)  Lumbar Spine Area Exam  Skin & Axial Inspection: No masses, redness, or  swelling Alignment: Symmetrical Functional ROM: Pain restricted ROM       Stability: No instability detected Muscle Tone/Strength: Functionally intact. No obvious neuro-muscular anomalies detected. Sensory (Neurological): Musculoskeletal pain pattern Palpation: No palpable anomalies       Provocative Tests: Hyperextension/rotation test: (+) bilaterally for facet joint pain. Lumbar quadrant test (Kemp's test): (+) bilaterally for facet joint pain.  Gait & Posture Assessment  Ambulation: Unassisted Gait: Relatively normal for age and body habitus Posture: WNL  Lower Extremity Exam    Side: Right lower extremity  Side: Left lower extremity  Stability: No instability observed          Stability: No instability observed          Skin & Extremity Inspection: Skin color, temperature, and hair growth are WNL. No peripheral edema or cyanosis. No masses, redness, swelling, asymmetry, or associated skin lesions. No contractures.  Skin & Extremity Inspection: Skin color, temperature, and hair growth are WNL. No peripheral edema or cyanosis. No masses, redness, swelling, asymmetry, or associated skin lesions. No contractures.  Functional ROM: Pain restricted ROM for hip joint          Functional ROM: Unrestricted ROM                  Muscle Tone/Strength: Functionally intact. No obvious neuro-muscular anomalies detected.  Muscle Tone/Strength: Functionally intact. No obvious neuro-muscular anomalies detected.  Sensory (Neurological): Musculoskeletal pain pattern        Sensory (Neurological): Musculoskeletal pain pattern        DTR: Patellar: deferred today Achilles: deferred today Plantar: deferred today  DTR: Patellar: deferred today Achilles: deferred today Plantar: deferred today  Palpation: No palpable anomalies  Palpation: No palpable anomalies    Assessment   Diagnosis Status  1. Lumbar facet arthropathy   2. Lumbar facet joint syndrome   3. 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  The patient has tried various conservative therapeutic options such as NSAIDs, Tylenol, muscle relaxants, physical therapy which was inadequately effective.  Patient's pain is predominantly axial with MRI and physical exam findings suggestive of facet arthropathy. Lumbar facet medial branch nerve blocks were discussed with the patient.  Risks and benefits were reviewed.  Patient would like to proceed with LEFT L3, L4, L5 medial branch nerve block.  Depending upon results, will consider radiofrequency ablation.    Orders:  Orders Placed This Encounter  Procedures   LUMBAR FACET(MEDIAL BRANCH NERVE BLOCK) MBNB    Standing Status:   Future    Standing Expiration Date:   12/20/2021    Scheduling Instructions:     Procedure: Lumbar facet block (AKA.: Lumbosacral medial branch nerve block)  Side: LEFT     Level: L3-4, L4-5, & L5-S1 Facets ( L3, L4, L5,  Medial Branch Nerves)     Sedation: PO Valium     Timeframe: ASAA    Order Specific Question:   Where will this procedure be performed?    Answer:   [facility]   Follow-up plan:   Return in about 3 weeks (around 12/11/2021) for Left L3, 4, 5 MBNB , in clinic (PO Valium).     L5/S1 ESI 03/17/21, TF RIGHT L3 ESI 05/12/21, 06/04/21     Recent Visits No visits were found meeting these conditions. Showing recent visits within past 90 days and meeting all other requirements Today's Visits Date Type Provider Dept  11/20/21 Office Visit Gillis Santa, MD [facility]  Showing today's visits and meeting all  other requirements Future Appointments No visits were found meeting these conditions. Showing future appointments within next 90 days and meeting all other requirements  I discussed the assessment and treatment plan with the patient. The patient was provided an opportunity to ask questions and all were answered. The patient agreed with the plan and demonstrated an understanding of the instructions.  Patient advised to call back or seek an in-person evaluation if the symptoms or condition worsens.  Duration of encounter: 30 minutes.  Total time on encounter, as per AMA guidelines included both the face-to-face and non-face-to-face time personally spent by the physician and/or other qualified health care professional(s) on the day of the encounter (includes time in activities that require the physician or other qualified health care professional and does not include time in activities normally performed by clinical staff). Physician's time may include the following activities when performed: preparing to see the patient (eg, review of tests, pre-charting review of records) obtaining and/or reviewing separately obtained history performing a medically appropriate examination and/or evaluation counseling and educating the patient/family/caregiver ordering medications, tests, or procedures referring and communicating with other health care professionals (when not separately reported) documenting clinical information in the electronic or other health record independently interpreting results (not separately reported) and communicating results to the patient/ family/caregiver care coordination (not separately reported)  Note by: Gillis Santa, MD Date: 11/20/2021; Time: 11:34 AM

## 2021-11-30 ENCOUNTER — Other Ambulatory Visit: Payer: Self-pay | Admitting: Family Medicine

## 2021-11-30 DIAGNOSIS — E1140 Type 2 diabetes mellitus with diabetic neuropathy, unspecified: Secondary | ICD-10-CM

## 2021-12-01 NOTE — Telephone Encounter (Signed)
Requested Prescriptions  Pending Prescriptions Disp Refills  . metFORMIN (GLUCOPHAGE-XR) 500 MG 24 hr tablet [Pharmacy Med Name: METFORMIN HCL ER 500 MG TABLET] 90 tablet 0    Sig: TAKE 1 TABLET BY MOUTH DAILY WITH SUPPER.     Endocrinology:  Diabetes - Biguanides Failed - 11/30/2021  1:03 AM      Failed - Cr in normal range and within 360 days    Creat  Date Value Ref Range Status  07/02/2021 1.20 0.70 - 1.35 mg/dL Final   Creatinine, Ser  Date Value Ref Range Status  08/21/2021 1.35 (H) 0.61 - 1.24 mg/dL Final   Creatinine, Urine  Date Value Ref Range Status  07/02/2021 61 20 - 320 mg/dL Final         Failed - HBA1C is between 0 and 7.9 and within 180 days    Hemoglobin A1C  Date Value Ref Range Status  07/30/2020 7.8  Final   Hgb A1c MFr Bld  Date Value Ref Range Status  07/02/2021 8.4 (H) <5.7 % of total Hgb Final    Comment:    For someone without known diabetes, a hemoglobin A1c value of 6.5% or greater indicates that they may have  diabetes and this should be confirmed with a follow-up  test. . For someone with known diabetes, a value <7% indicates  that their diabetes is well controlled and a value  greater than or equal to 7% indicates suboptimal  control. A1c targets should be individualized based on  duration of diabetes, age, comorbid conditions, and  other considerations. . Currently, no consensus exists regarding use of hemoglobin A1c for diagnosis of diabetes for children. .          Failed - eGFR in normal range and within 360 days    EGFR (African American)  Date Value Ref Range Status  04/13/2013 >60  Final   GFR calc Af Amer  Date Value Ref Range Status  12/19/2016 >60 >60 mL/min Final    Comment:    (NOTE) The eGFR has been calculated using the CKD EPI equation. This calculation has not been validated in all clinical situations. eGFR's persistently <60 mL/min signify possible Chronic Kidney Disease.    EGFR (Non-African Amer.)  Date  Value Ref Range Status  04/13/2013 >60  Final    Comment:    eGFR values <63m/min/1.73 m2 may be an indication of chronic kidney disease (CKD). Calculated eGFR is useful in patients with stable renal function. The eGFR calculation will not be reliable in acutely ill patients when serum creatinine is changing rapidly. It is not useful in  patients on dialysis. The eGFR calculation may not be applicable to patients at the low and high extremes of body sizes, pregnant women, and vegetarians.    GFR, Estimated  Date Value Ref Range Status  08/21/2021 59 (L) >60 mL/min Final    Comment:    (NOTE) Calculated using the CKD-EPI Creatinine Equation (2021)    eGFR  Date Value Ref Range Status  07/02/2021 68 > OR = 60 mL/min/1.730mFinal    Comment:    The eGFR is based on the CKD-EPI 2021 equation. To calculate  the new eGFR from a previous Creatinine or Cystatin C result, go to https://www.kidney.org/professionals/ kdoqi/gfr%5Fcalculator          Failed - B12 Level in normal range and within 720 days    No results found for: "VITAMINB12"       Passed - Valid encounter within last 6  months    Recent Outpatient Visits          3 months ago RUQ abdominal pain   [facility] [place], Devonne Doughty, DO   5 months ago Type 2 diabetes mellitus with diabetic neuropathy, with long-term current use of insulin ([place])   [facility] [place], Devonne Doughty, DO   9 months ago [place], DO             Passed - CBC within normal limits and completed in the last 12 months    WBC  Date Value Ref Range Status  08/21/2021 7.4 4.0 - 10.5 K/uL Final   RBC  Date Value Ref Range Status  08/21/2021 5.55 4.22 - 5.81 MIL/uL Final   Hemoglobin  Date Value Ref Range Status  08/21/2021 17.4 (H) 13.0 - 17.0 g/dL Final   HGB  Date Value Ref Range Status  04/13/2013 16.0 13.0 - 18.0 g/dL Final    HCT  Date Value Ref Range Status  08/21/2021 50.7 39.0 - 52.0 % Final  04/13/2013 46.6 40.0 - 52.0 % Final   MCHC  Date Value Ref Range Status  08/21/2021 34.3 30.0 - 36.0 g/dL Final   [facility]  Date Value Ref Range Status  08/21/2021 31.4 26.0 - 34.0 pg Final   MCV  Date Value Ref Range Status  08/21/2021 91.4 80.0 - 100.0 fL Final  04/13/2013 95 80 - 100 fL Final   No results found for: "PLTCOUNTKUC", "LABPLAT", "POCPLA" RDW  Date Value Ref Range Status  08/21/2021 13.0 11.5 - 15.5 % Final  04/13/2013 12.7 11.5 - 14.5 % Final

## 2021-12-17 ENCOUNTER — Ambulatory Visit
Payer: Medicare PPO | Attending: Student in an Organized Health Care Education/Training Program | Admitting: Student in an Organized Health Care Education/Training Program

## 2021-12-17 ENCOUNTER — Ambulatory Visit
Admission: RE | Admit: 2021-12-17 | Discharge: 2021-12-17 | Disposition: A | Discharge status: Home / Self Care | Payer: Medicare PPO | Source: Ambulatory Visit | Attending: Student in an Organized Health Care Education/Training Program | Admitting: Student in an Organized Health Care Education/Training Program

## 2021-12-17 ENCOUNTER — Other Ambulatory Visit: Payer: Self-pay

## 2021-12-17 ENCOUNTER — Encounter: Payer: Self-pay | Admitting: Student in an Organized Health Care Education/Training Program

## 2021-12-17 DIAGNOSIS — G894 Chronic pain syndrome: Secondary | ICD-10-CM | POA: Diagnosis present

## 2021-12-17 DIAGNOSIS — M47816 Spondylosis without myelopathy or radiculopathy, lumbar region: Secondary | ICD-10-CM

## 2021-12-17 MED ORDER — DIAZEPAM 5 MG PO TABS
10.0000 mg | ORAL_TABLET | ORAL | Status: AC
  Administered 2021-12-17: 10 mg via ORAL

## 2021-12-17 MED ORDER — DIAZEPAM 5 MG PO TABS
ORAL_TABLET | ORAL | Status: AC
  Filled 2021-12-17: qty 1

## 2021-12-17 MED ORDER — ROPIVACAINE HCL 2 MG/ML IJ SOLN
INTRAMUSCULAR | Status: AC
  Filled 2021-12-17: qty 20

## 2021-12-17 MED ORDER — ROPIVACAINE HCL 2 MG/ML IJ SOLN
9.0000 mL | Freq: Once | INTRAMUSCULAR | Status: AC
Start: 2021-12-17 — End: 2021-12-17
  Administered 2021-12-17: 9 mL via PERINEURAL

## 2021-12-17 MED ORDER — DEXAMETHASONE SODIUM PHOSPHATE 10 MG/ML IJ SOLN
INTRAMUSCULAR | Status: AC
  Filled 2021-12-17: qty 1

## 2021-12-17 MED ORDER — LIDOCAINE HCL 2 % IJ SOLN
20.0000 mL | Freq: Once | INTRAMUSCULAR | Status: AC
  Administered 2021-12-17: 400 mg

## 2021-12-17 MED ORDER — DEXAMETHASONE SODIUM PHOSPHATE 10 MG/ML IJ SOLN
10.0000 mg | Freq: Once | INTRAMUSCULAR | Status: AC
  Administered 2021-12-17: 10 mg

## 2021-12-17 MED ORDER — LIDOCAINE HCL 2 % IJ SOLN
INTRAMUSCULAR | Status: AC
  Filled 2021-12-17: qty 20

## 2021-12-17 NOTE — Progress Notes (Signed)
Safety precautions to be maintained throughout the outpatient stay will include: orient to surroundings, keep bed in low position, maintain call bell within reach at all times, provide assistance with transfer out of bed and ambulation.  

## 2021-12-17 NOTE — Progress Notes (Signed)
PROVIDER NOTE: Interpretation of information contained herein should be left to medically-trained personnel. Specific patient instructions are provided elsewhere under "Patient Instructions" section of medical record. This document was created in part using STT-dictation technology, any transcriptional errors that may result from this process are unintentional.  Patient: Infant Zink Type: Established DOB: March 21, 1956 MRN: [id number] PCP: Olin Hauser, DO  Service: Procedure DOS: 12/17/2021 Setting: Ambulatory Location: Ambulatory outpatient facility Delivery: Face-to-face Provider: Gillis Santa, MD Specialty: Interventional Pain Management Specialty designation: 09 Location: Outpatient facility Ref. Prov.: Gillis Santa, MD    Procedure:           Type: Lumbar Facet, Medial Branch Block(s) #1  Laterality: Left  Level: L3, L4, L5,  Medial Branch Level(s). Injecting these levels blocks the L3-4 L4-5 lumbar facet joints. Imaging: Fluoroscopic guidance         Anesthesia: Local anesthesia (1-2% Lidocaine) Anxiolysis: Oral Valium 10 mg Sedation:  minimal . DOS: 12/17/2021 Performed by: Gillis Santa, MD  Primary Purpose: Diagnostic/Therapeutic Indications: Low back pain severe enough to impact quality of life or function. 1. Lumbar facet arthropathy   2. Lumbar facet joint syndrome   3. Chronic pain syndrome    NAS-11 Pain score:   Pre-procedure: 9 /10   Post-procedure: 8/10     Position / Prep / Materials:  Position: Prone  Prep solution: DuraPrep (Iodine Povacrylex [0.7% available iodine] and Isopropyl Alcohol, 74% w/w) Area Prepped: Posterolateral Lumbosacral Spine (Wide prep: From the lower border of the scapula down to the end of the tailbone and from flank to flank.)  Materials:  Tray: Block Needle(s):  Type: Spinal  Gauge (G): 25  Length: 3.5-in Qty: 3  Pre-op H&P Assessment:  Mr. Schaller is a 66 y.o. (year old), male patient, seen today for interventional  treatment. He  has a past surgical history that includes Shoulder surgery (Bilateral) and Cholecystectomy. Mr. Frazzini has a current medication list which includes the following prescription(s): [facility] body, clonazepam, diclofenac, famotidine, gabapentin, lantus solostar, januvia, metformin, nortriptyline, pantoprazole, potassium chloride sa, pravastatin, tizanidine, triamcinolone cream, and albuterol. His primarily concern today is the Back Pain (low)  Initial Vital Signs:  Pulse/HCG Rate: (!) 104  Temp: (!) 97.2 F (36.2 C) Resp: 18 BP: 138/83 SpO2: 98 %  BMI: Estimated body mass index is 25.82 kg/m as calculated from the following:   Height as of this encounter: '5\' 6"'$  (1.676 m).   Weight as of this encounter: 160 lb (72.6 kg).  Risk Assessment: Allergies: Reviewed. He is allergic to latex.  Allergy Precautions: None required Coagulopathies: Reviewed. None identified.  Blood-thinner therapy: None at this time Active Infection(s): Reviewed. None identified. Mr. Forcier is afebrile  Site Confirmation: Mr. Wuebker was asked to confirm the procedure and laterality before marking the site Procedure checklist: Completed Consent: Before the procedure and under the influence of no sedative(s), amnesic(s), or anxiolytics, the patient was informed of the treatment options, risks and possible complications. To fulfill our ethical and legal obligations, as recommended by the American Medical Association's Code of Ethics, I have informed the patient of my clinical impression; the nature and purpose of the treatment or procedure; the risks, benefits, and possible complications of the intervention; the alternatives, including doing nothing; the risk(s) and benefit(s) of the alternative treatment(s) or procedure(s); and the risk(s) and benefit(s) of doing nothing. The patient was provided information about the general risks and possible complications associated with the procedure. These may include,  but are not limited to: failure to  achieve desired goals, infection, bleeding, organ or nerve damage, allergic reactions, paralysis, and death. In addition, the patient was informed of those risks and complications associated to Spine-related procedures, such as failure to decrease pain; infection (i.e.: Meningitis, epidural or intraspinal abscess); bleeding (i.e.: epidural hematoma, subarachnoid hemorrhage, or any other type of intraspinal or peri-dural bleeding); organ or nerve damage (i.e.: Any type of peripheral nerve, nerve root, or spinal cord injury) with subsequent damage to sensory, motor, and/or autonomic systems, resulting in permanent pain, numbness, and/or weakness of one or several areas of the body; allergic reactions; (i.e.: anaphylactic reaction); and/or death. Furthermore, the patient was informed of those risks and complications associated with the medications. These include, but are not limited to: allergic reactions (i.e.: anaphylactic or anaphylactoid reaction(s)); adrenal axis suppression; blood sugar elevation that in diabetics may result in ketoacidosis or comma; water retention that in patients with history of congestive heart failure may result in shortness of breath, pulmonary edema, and decompensation with resultant heart failure; weight gain; swelling or edema; medication-induced neural toxicity; particulate matter embolism and blood vessel occlusion with resultant organ, and/or nervous system infarction; and/or aseptic necrosis of one or more joints. Finally, the patient was informed that Medicine is not an exact science; therefore, there is also the possibility of unforeseen or unpredictable risks and/or possible complications that may result in a catastrophic outcome. The patient indicated having understood very clearly. We have given the patient no guarantees and we have made no promises. Enough time was given to the patient to ask questions, all of which were answered to the  patient's satisfaction. Mr. Canepa has indicated that he wanted to continue with the procedure. Attestation: I, the ordering provider, attest that I have discussed with the patient the benefits, risks, side-effects, alternatives, likelihood of achieving goals, and potential problems during recovery for the procedure that I have provided informed consent. Date  Time: 12/17/2021 10:24 AM  Pre-Procedure Preparation:  Monitoring: As per clinic protocol. Respiration, ETCO2, SpO2, BP, heart rate and rhythm monitor placed and checked for adequate function Safety Precautions: Patient was assessed for positional comfort and pressure points before starting the procedure. Time-out: I initiated and conducted the "Time-out" before starting the procedure, as per protocol. The patient was asked to participate by confirming the accuracy of the "Time Out" information. Verification of the correct person, site, and procedure were performed and confirmed by me, the nursing staff, and the patient. "Time-out" conducted as per Joint Commission's Universal Protocol (UP.01.01.01). Time: 1051  Description of Procedure:          Laterality: Left Targeted Levels: L3, L4, L5, Medial Branch Level(s)  Safety Precautions: Aspiration looking for blood return was conducted prior to all injections. At no point did we inject any substances, as a needle was being advanced. Before injecting, the patient was told to immediately notify me if he was experiencing any new onset of "ringing in the ears, or metallic taste in the mouth". No attempts were made at seeking any paresthesias. Safe injection practices and needle disposal techniques used. Medications properly checked for expiration dates. SDV (single dose vial) medications used. After the completion of the procedure, all disposable equipment used was discarded in the proper designated medical waste containers. Local Anesthesia: Protocol guidelines were followed. The patient was  positioned over the fluoroscopy table. The area was prepped in the usual manner. The time-out was completed. The target area was identified using fluoroscopy. A 12-in long, straight, sterile hemostat was used with fluoroscopic guidance to  locate the targets for each level blocked. Once located, the skin was marked with an approved surgical skin marker. Once all sites were marked, the skin (epidermis, dermis, and hypodermis), as well as deeper tissues (fat, connective tissue and muscle) were infiltrated with a small amount of a short-acting local anesthetic, loaded on a 10cc syringe with a 25G, 1.5-in  Needle. An appropriate amount of time was allowed for local anesthetics to take effect before proceeding to the next step. Local Anesthetic: Lidocaine 2.0% The unused portion of the local anesthetic was discarded in the proper designated containers. Technical description of process:  L3 Medial Branch Nerve Block (MBB): The target area for the L3 medial branch is at the junction of the postero-lateral aspect of the superior articular process and the superior, posterior, and medial edge of the transverse process of L4. Under fluoroscopic guidance, a Quincke needle was inserted until contact was made with os over the superior postero-lateral aspect of the pedicular shadow (target area). After negative aspiration for blood, 561m of the nerve block solution was injected without difficulty or complication. The needle was removed intact. L4 Medial Branch Nerve Block (MBB): The target area for the L4 medial branch is at the junction of the postero-lateral aspect of the superior articular process and the superior, posterior, and medial edge of the transverse process of L5. Under fluoroscopic guidance, a Quincke needle was inserted until contact was made with os over the superior postero-lateral aspect of the pedicular shadow (target area). After negative aspiration for blood, 234mof the nerve block solution was injected  without difficulty or complication. The needle was removed intact. L5 Medial Branch Nerve Block (MBB): The target area for the L5 medial branch is at the junction of the postero-lateral aspect of the superior articular process and the superior, posterior, and medial edge of the sacral ala. Under fluoroscopic guidance, a Quincke needle was inserted until contact was made with os over the superior postero-lateral aspect of the pedicular shadow (target area). After negative aspiration for blood, 61m4mf the nerve block solution was injected without difficulty or complication. The needle was removed intact.   Once the entire procedure was completed, the treated area was cleaned, making sure to leave some of the prepping solution back to take advantage of its long term bactericidal properties.         Illustration of the posterior view of the lumbar spine and the posterior neural structures. Laminae of L2 through S1 are labeled. DPRL5, dorsal primary ramus of L5; DPRS1, dorsal primary ramus of S1; DPR3, dorsal primary ramus of L3; FJ, facet (zygapophyseal) joint L3-L4; I, inferior articular process of L4; LB1, lateral branch of dorsal primary ramus of L1; IAB, inferior articular branches from L3 medial branch (supplies L4-L5 facet joint); IBP, intermediate branch plexus; MB3, medial branch of dorsal primary ramus of L3; NR3, third lumbar nerve root; S, superior articular process of L5; SAB, superior articular branches from L4 (supplies L4-5 facet joint also); TP3, transverse process of L3.  Vitals:   12/17/21 1037 12/17/21 1049 12/17/21 1056  BP: 138/83 133/78 139/80  Pulse: (!) 104 (!) 108 (!) 109  Resp: 18 (!) 22 (!) 22  Temp: (!) 97.2 F (36.2 C)    TempSrc: Temporal    SpO2: 98% 95% 96%  Weight: 160 lb (72.6 kg)    Height: '5\' 6"'$  (1.676 m)       Start Time: 1051 hrs. End Time: 1055 hrs.  Imaging Guidance (Spinal):  Type of Imaging Technique: Fluoroscopy Guidance  (Spinal) Indication(s): Assistance in needle guidance and placement for procedures requiring needle placement in or near specific anatomical locations not easily accessible without such assistance. Exposure Time: Please see nurses notes. Contrast: None used. Fluoroscopic Guidance: I was personally present during the use of fluoroscopy. "Tunnel Vision Technique" used to obtain the best possible view of the target area. Parallax error corrected before commencing the procedure. "Direction-depth-direction" technique used to introduce the needle under continuous pulsed fluoroscopy. Once target was reached, antero-posterior, oblique, and lateral fluoroscopic projection used confirm needle placement in all planes. Images permanently stored in EMR. Interpretation: No contrast injected. I personally interpreted the imaging intraoperatively. Adequate needle placement confirmed in multiple planes. Permanent images saved into the patient's record.  Antibiotic Prophylaxis:   Anti-infectives (From admission, onward)    None      Indication(s): None identified  Post-operative Assessment:  Post-procedure Vital Signs:  Pulse/HCG Rate: (!) 109 (st)  Temp: (!) 97.2 F (36.2 C) Resp: (!) 22 BP: 139/80 SpO2: 96 %  EBL: None  Complications: No immediate post-treatment complications observed by team, or reported by patient.  Note: The patient tolerated the entire procedure well. A repeat set of vitals were taken after the procedure and the patient was kept under observation following institutional policy, for this type of procedure. Post-procedural neurological assessment was performed, showing return to baseline, prior to discharge. The patient was provided with post-procedure discharge instructions, including a section on how to identify potential problems. Should any problems arise concerning this procedure, the patient was given instructions to immediately contact us, at any time, without hesitation. In  any case, we plan to contact the patient by telephone for a follow-up status report regarding this interventional procedure.  Comments:  No additional relevant information.  Plan of Care  Mr. Martelli is also seeing orthopedics for left hip bursitis.  He states that they have recommended a left hip bursa injection under fluoroscopy.  I will evaluate his symptoms in approximately 4 weeks to determine if a left hip bursa injection could be helpful.  Patient endorsed understanding.  Orders:  Orders Placed This Encounter  Procedures   DG PAIN [facility] C-ARM 1-60 MIN NO REPORT    Intraoperative interpretation by procedural physician at [facility].    Standing Status:   Standing    Number of Occurrences:   1    Order Specific Question:   Reason for exam:    Answer:   Assistance in needle guidance and placement for procedures requiring needle placement in or near specific anatomical locations not easily accessible without such assistance.     Medications ordered for procedure: Meds ordered this encounter  Medications   lidocaine (XYLOCAINE) 2 % (with pres) injection 400 mg   diazepam (VALIUM) tablet 10 mg    Make sure Flumazenil is available in the pyxis when using this medication. If oversedation occurs, administer 0.2 mg IV over 15 sec. If after 45 sec no response, administer 0.2 mg again over 1 min; may repeat at 1 min intervals; not to exceed 4 doses (1 mg)   dexamethasone (DECADRON) injection 10 mg   ropivacaine (PF) 2 mg/mL (0.2%) (NAROPIN) injection 9 mL   Medications administered: We administered lidocaine, diazepam, dexamethasone, and ropivacaine (PF) 2 mg/mL (0.2%).  See the medical record for exact dosing, route, and time of administration.  Follow-up plan:   Return in about 4 weeks (around 01/14/2022) for Post Procedure Evaluation, in person.  L5/S1 ESI 03/17/21, TF RIGHT L3 ESI 05/12/21, 06/04/21; Left L3,4,5, MBNB 12/17/21      Recent Visits Date Type Provider  Dept  11/20/21 Office Visit Gillis Santa, MD [facility]  Showing recent visits within past 90 days and meeting all other requirements Today's Visits Date Type Provider Dept  12/17/21 Procedure visit Gillis Santa, MD [facility]  Showing today's visits and meeting all other requirements Future Appointments Date Type Provider Dept  01/13/22 Appointment Gillis Santa, MD [facility]  Showing future appointments within next 90 days and meeting all other requirements  Disposition: Discharge home  Discharge (Date  Time): 12/17/2021;   hrs.   Primary Care Physician: Olin Hauser, DO Location: [facility] Note by: Gillis Santa, MD Date: 12/17/2021; Time: 11:02 AM  Disclaimer:  Medicine is not an exact science. The only guarantee in medicine is that nothing is guaranteed. It is important to note that the decision to proceed with this intervention was based on the information collected from the patient. The Data and conclusions were drawn from the patient's questionnaire, the interview, and the physical examination. Because the information was provided in large part by the patient, it cannot be guaranteed that it has not been purposely or unconsciously manipulated. Every effort has been made to obtain as much relevant data as possible for this evaluation. It is important to note that the conclusions that lead to this procedure are derived in large part from the available data. Always take into account that the treatment will also be dependent on availability of resources and existing treatment guidelines, considered by other Pain Management Practitioners as being common knowledge and practice, at the time of the intervention. For Medico-Legal purposes, it is also important to point out that variation in procedural techniques and pharmacological choices are the acceptable norm. The indications, contraindications, technique, and  results of the above procedure should only be interpreted and judged by a Board-Certified Interventional Pain Specialist with extensive familiarity and expertise in the same exact procedure and technique.

## 2021-12-18 ENCOUNTER — Telehealth: Payer: Self-pay

## 2021-12-18 ENCOUNTER — Encounter: Payer: Self-pay | Admitting: Student in an Organized Health Care Education/Training Program

## 2021-12-18 ENCOUNTER — Encounter (INDEPENDENT_AMBULATORY_CARE_PROVIDER_SITE_OTHER): Payer: Self-pay | Admitting: *Deleted

## 2021-12-18 NOTE — Telephone Encounter (Signed)
Post procedure phone call.  LM 

## 2021-12-23 ENCOUNTER — Emergency Department
Admission: EM | Admit: 2021-12-23 | Discharge: 2021-12-23 | Disposition: A | Discharge status: Home / Self Care | Payer: Medicare PPO | Attending: Emergency Medicine | Admitting: Emergency Medicine

## 2021-12-23 ENCOUNTER — Encounter: Payer: Self-pay | Admitting: Emergency Medicine

## 2021-12-23 ENCOUNTER — Other Ambulatory Visit: Payer: Self-pay

## 2021-12-23 ENCOUNTER — Emergency Department: Payer: Medicare PPO

## 2021-12-23 DIAGNOSIS — S8992XA Unspecified injury of left lower leg, initial encounter: Secondary | ICD-10-CM | POA: Diagnosis present

## 2021-12-23 DIAGNOSIS — W010XXA Fall on same level from slipping, tripping and stumbling without subsequent striking against object, initial encounter: Secondary | ICD-10-CM | POA: Diagnosis not present

## 2021-12-23 DIAGNOSIS — J449 Chronic obstructive pulmonary disease, unspecified: Secondary | ICD-10-CM | POA: Insufficient documentation

## 2021-12-23 DIAGNOSIS — Z8673 Personal history of transient ischemic attack (TIA), and cerebral infarction without residual deficits: Secondary | ICD-10-CM | POA: Diagnosis not present

## 2021-12-23 DIAGNOSIS — I10 Essential (primary) hypertension: Secondary | ICD-10-CM | POA: Diagnosis not present

## 2021-12-23 DIAGNOSIS — S8002XA Contusion of left knee, initial encounter: Secondary | ICD-10-CM | POA: Insufficient documentation

## 2021-12-23 DIAGNOSIS — E119 Type 2 diabetes mellitus without complications: Secondary | ICD-10-CM | POA: Diagnosis not present

## 2021-12-23 MED ORDER — HYDROCODONE-ACETAMINOPHEN 5-325 MG PO TABS
1.0000 | ORAL_TABLET | Freq: Once | ORAL | Status: AC
  Administered 2021-12-23: 1 via ORAL
  Filled 2021-12-23: qty 1

## 2021-12-23 MED ORDER — HYDROCODONE-ACETAMINOPHEN 5-325 MG PO TABS
1.0000 | ORAL_TABLET | Freq: Four times a day (QID) | ORAL | 0 refills | Status: DC | PRN
Start: 2021-12-23 — End: 2022-02-18

## 2021-12-23 NOTE — ED Provider Notes (Signed)
[facility] Provider Note    Event Date/Time   First MD Initiated Contact with Patient 12/23/21 [phone]     (approximate)   History   Knee Pain   HPI  Devin Mayer is a 66 y.o. male presents to the ED with complaint of left knee pain.  Patient states he was tripped by his cat and landed directly on his left knee.  Patient complains of moderate to severe pain to his left knee but has continued to ambulate slowly.  He reports that he does have a walker at home that he can use when needed.  He denies any head injury or loss of consciousness.  Patient has a history of COPD, diabetes, hypertension, CVA.      Physical Exam   Triage Vital Signs: ED Triage Vitals  Enc Vitals Group     BP 12/23/21 0710 (!) 174/80     Pulse Rate 12/23/21 0710 94     Resp 12/23/21 0710 16     Temp 12/23/21 0710 97.8 F (36.6 C)     Temp Source 12/23/21 0710 Oral     SpO2 12/23/21 0710 96 %     Weight 12/23/21 0659 159 lb 13.3 oz (72.5 kg)     Height 12/23/21 0659 '5\' 6"'$  (1.676 m)     Head Circumference --      Peak Flow --      Pain Score 12/23/21 0659 10     Pain Loc --      Pain Edu? --      Excl. in [place]? --     Most recent vital signs: Vitals:   12/23/21 0710  BP: (!) 174/80  Pulse: 94  Resp: 16  Temp: 97.8 F (36.6 C)  SpO2: 96%     General: Awake, no distress.  Talkative and alert. CV:  Good peripheral perfusion.  Resp:  Normal effort.  Abd:  No distention.  Other:  Left knee with moderate tenderness on palpation anteriorly.  No effusion present.  No gross deformity and no soft tissue edema or abrasions are noted.  Range of motion is slow and guarded secondary to pain.   ED Results / Procedures / Treatments   Labs (all labs ordered are listed, but only abnormal results are displayed) Labs Reviewed - No data to display    RADIOLOGY  Left knee x-ray images were reviewed by myself and independent of the radiologist.  No acute bony injury or  dislocation is noted.  Radiology report is negative for acute bony injury.   PROCEDURES:  Critical Care performed:   Procedures   MEDICATIONS ORDERED IN ED: Medications  HYDROcodone-acetaminophen (NORCO/VICODIN) 5-325 MG per tablet 1 tablet (1 tablet Oral Given 12/23/21 2536)     IMPRESSION / MDM / ASSESSMENT AND PLAN / ED COURSE  I reviewed the triage vital signs and the nursing notes.   Differential diagnosis includes, but is not limited to, contusion left knee, fracture left knee, dislocation, sprain.  66 year old male presents to the ED with complaint of left knee pain after falling when he was tripped by his cat.  Physical exam has low suspicion for fracture and x-rays were reassuring and patient was made aware that he does not have a fracture, dislocation or noted effusion.  Ace wrap was applied and patient was given hydrocodone while in the ED.  A prescription for the same was sent to his pharmacy.  Patient is encouraged to ice and elevate to reduce swelling and help  with pain.  He is to follow-up with his PCP if any continued problems.      Patient's presentation is most consistent with acute complicated illness / injury requiring diagnostic workup.  FINAL CLINICAL IMPRESSION(S) / ED DIAGNOSES   Final diagnoses:  Contusion of left knee, initial encounter     Rx / DC Orders   ED Discharge Orders          Ordered    HYDROcodone-acetaminophen (NORCO/VICODIN) 5-325 MG tablet  Every 6 hours PRN        12/23/21 0815             Note:  This document was prepared using Dragon voice recognition software and may include unintentional dictation errors.   Johnn Hai, PA-C 12/23/21 [id number]    Harvest Dark, MD 12/23/21 Lurena Nida

## 2021-12-23 NOTE — ED Notes (Signed)
73 yom with a c/c of left knee pain due to a an accidental fall this morning at 3 am. The pt advised he is also having numbness to his left leg from mid thigh all the way down. The pt is alert and oriented x 4 and warm, pink, and dry.

## 2021-12-23 NOTE — Discharge Instructions (Signed)
Follow-up with your primary care provider if any continued problems or concerns.  Ice and elevation to reduce swelling and help with pain.  Use walker when walking for added support and protection.  A prescription for pain medication was sent to the pharmacy to take every 6 hours if needed for pain.  Be aware that this medication could cause drowsiness and increase your risk for falling.

## 2021-12-23 NOTE — ED Triage Notes (Signed)
Says his cats tripped him and he fell onto left knee.

## 2021-12-27 ENCOUNTER — Encounter: Payer: Self-pay | Admitting: Student in an Organized Health Care Education/Training Program

## 2021-12-29 NOTE — Telephone Encounter (Signed)
Sounds like you will need to make an appointment to discuss this with the doctor.

## 2021-12-29 NOTE — Telephone Encounter (Signed)
I see that you have an appointment on 01/01/22. You can discuss this issue with the D octor then. Have a good day.

## 2022-01-01 ENCOUNTER — Ambulatory Visit
Payer: Medicare PPO | Attending: Student in an Organized Health Care Education/Training Program | Admitting: Student in an Organized Health Care Education/Training Program

## 2022-01-01 ENCOUNTER — Encounter: Payer: Self-pay | Admitting: Student in an Organized Health Care Education/Training Program

## 2022-01-01 VITALS — BP 139/73 | HR 95 | Temp 97.3°F | Resp 20 | Ht 66.0 in | Wt 168.0 lb

## 2022-01-01 DIAGNOSIS — G8929 Other chronic pain: Secondary | ICD-10-CM | POA: Diagnosis present

## 2022-01-01 DIAGNOSIS — G894 Chronic pain syndrome: Secondary | ICD-10-CM

## 2022-01-01 DIAGNOSIS — M48061 Spinal stenosis, lumbar region without neurogenic claudication: Secondary | ICD-10-CM

## 2022-01-01 DIAGNOSIS — M47816 Spondylosis without myelopathy or radiculopathy, lumbar region: Secondary | ICD-10-CM

## 2022-01-01 DIAGNOSIS — M5416 Radiculopathy, lumbar region: Secondary | ICD-10-CM | POA: Insufficient documentation

## 2022-01-01 MED ORDER — GABAPENTIN 300 MG PO CAPS: ORAL_CAPSULE | ORAL | 2 refills | Status: DC

## 2022-01-01 NOTE — Progress Notes (Signed)
Safety precautions to be maintained throughout the outpatient stay will include: orient to surroundings, keep bed in low position, maintain call bell within reach at all times, provide assistance with transfer out of bed and ambulation.  

## 2022-01-01 NOTE — Assessment & Plan Note (Signed)
Great response after left L3, L4, L5 medial branch nerve block.  100% pain relief that is ongoing.  Can consider repeating in the future and also considering lumbar radiofrequency ablation.  Will continue to monitor for the time being.

## 2022-01-01 NOTE — Assessment & Plan Note (Signed)
Patient is having increased radiating left leg pain down his left buttock his posterior lateral thigh stopping at his lateral knee.  We discussed a left lumbar epidural steroid injection at left L4-L5.  Risk and benefits were reviewed and patient would like to proceed.  I also recommend that he increase his gabapentin to 600 mg nightly.

## 2022-01-01 NOTE — Patient Instructions (Signed)
____________________________________________________________________________________________  General Risks and Possible Complications  Patient Responsibilities: It is important that you read this as it is part of your informed consent. It is our duty to inform you of the risks and possible complications associated with treatments offered to you. It is your responsibility as a patient to read this and to ask questions about anything that is not clear or that you believe was not covered in this document.  Patient's Rights: You have the right to refuse treatment. You also have the right to change your mind, even after initially having agreed to have the treatment done. However, under this last option, if you wait until the last second to change your mind, you may be charged for the materials used up to that point.  Introduction: Medicine is not an exact science. Everything in Medicine, including the lack of treatment(s), carries the potential for danger, harm, or loss (which is by definition: Risk). In Medicine, a complication is a secondary problem, condition, or disease that can aggravate an already existing one. All treatments carry the risk of possible complications. The fact that a side effects or complications occurs, does not imply that the treatment was conducted incorrectly. It must be clearly understood that these can happen even when everything is done following the highest safety standards.  No treatment: You can choose not to proceed with the proposed treatment alternative. The "PRO(s)" would include: avoiding the risk of complications associated with the therapy. The "CON(s)" would include: not getting any of the treatment benefits. These benefits fall under one of three categories: diagnostic; therapeutic; and/or palliative. Diagnostic benefits include: getting information which can ultimately lead to improvement of the disease or symptom(s). Therapeutic benefits are those associated with  the successful treatment of the disease. Finally, palliative benefits are those related to the decrease of the primary symptoms, without necessarily curing the condition (example: decreasing the pain from a flare-up of a chronic condition, such as incurable terminal cancer).  General Risks and Complications: These are associated to most interventional treatments. They can occur alone, or in combination. They fall under one of the following six (6) categories: no benefit or worsening of symptoms; bleeding; infection; nerve damage; allergic reactions; and/or death. No benefits or worsening of symptoms: In Medicine there are no guarantees, only probabilities. No healthcare provider can ever guarantee that a medical treatment will work, they can only state the probability that it may. Furthermore, there is always the possibility that the condition may worsen, either directly, or indirectly, as a consequence of the treatment. Bleeding: This is more common if the patient is taking a blood thinner, either prescription or over the counter (example: Goody Powders, Fish oil, Aspirin, Garlic, etc.), or if suffering a condition associated with impaired coagulation (example: Hemophilia, cirrhosis of the liver, low platelet counts, etc.). However, even if you do not have one on these, it can still happen. If you have any of these conditions, or take one of these drugs, make sure to notify your treating physician. Infection: This is more common in patients with a compromised immune system, either due to disease (example: diabetes, cancer, human immunodeficiency virus [HIV], etc.), or due to medications or treatments (example: therapies used to treat cancer and rheumatological diseases). However, even if you do not have one on these, it can still happen. If you have any of these conditions, or take one of these drugs, make sure to notify your treating physician. Nerve Damage: This is more common when the treatment is an    invasive one, but it can also happen with the use of medications, such as those used in the treatment of cancer. The damage can occur to small secondary nerves, or to large primary ones, such as those in the spinal cord and brain. This damage may be temporary or permanent and it may lead to impairments that can range from temporary numbness to permanent paralysis and/or brain death. Allergic Reactions: Any time a substance or material comes in contact with our body, there is the possibility of an allergic reaction. These can range from a mild skin rash (contact dermatitis) to a severe systemic reaction (anaphylactic reaction), which can result in death. Death: In general, any medical intervention can result in death, most of the time due to an unforeseen complication. ____________________________________________________________________________________________   Epidural Steroid Injection Patient Information  Description: The epidural space surrounds the nerves as they exit the spinal cord.  In some patients, the nerves can be compressed and inflamed by a bulging disc or a tight spinal canal (spinal stenosis).  By injecting steroids into the epidural space, we can bring irritated nerves into direct contact with a potentially helpful medication.  These steroids act directly on the irritated nerves and can reduce swelling and inflammation which often leads to decreased pain.  Epidural steroids may be injected anywhere along the spine and from the neck to the low back depending upon the location of your pain.   After numbing the skin with local anesthetic (like Novocaine), a small needle is passed into the epidural space slowly.  You may experience a sensation of pressure while this is being done.  The entire block usually last less than 10 minutes.  Conditions which may be treated by epidural steroids:  Low back and leg pain Neck and arm pain Spinal stenosis Post-laminectomy syndrome Herpes zoster  (shingles) pain Pain from compression fractures  Preparation for the injection:  Do not eat any solid food or dairy products within 8 hours of your appointment.  You may drink clear liquids up to 3 hours before appointment.  Clear liquids include water, black coffee, juice or soda.  No milk or cream please. You may take your regular medication, including pain medications, with a sip of water before your appointment  Diabetics should hold regular insulin (if taken separately) and take 1/2 normal NPH dos the morning of the procedure.  Carry some sugar containing items with you to your appointment. A driver must accompany you and be prepared to drive you home after your procedure.  Bring all your current medications with your. An IV may be inserted and sedation may be given at the discretion of the physician.   A blood pressure cuff, EKG and other monitors will often be applied during the procedure.  Some patients may need to have extra oxygen administered for a short period. You will be asked to provide medical information, including your allergies, prior to the procedure.  We must know immediately if you are taking blood thinners (like Coumadin/Warfarin)  Or if you are allergic to IV iodine contrast (dye). We must know if you could possible be pregnant.  Possible side-effects: Bleeding from needle site Infection (rare, may require surgery) Nerve injury (rare) Numbness & tingling (temporary) Difficulty urinating (rare, temporary) Spinal headache ( a headache worse with upright posture) Light -headedness (temporary) Pain at injection site (several days) Decreased blood pressure (temporary) Weakness in arm/leg (temporary) Pressure sensation in back/neck (temporary)  Call if you experience: Fever/chills associated with headache or increased back/neck pain.   Headache worsened by an upright position. New onset weakness or numbness of an extremity below the injection site Hives or difficulty  breathing (go to the emergency room) Inflammation or drainage at the infection site Severe back/neck pain Any new symptoms which are concerning to you  Please note:  Although the local anesthetic injected can often make your back or neck feel good for several hours after the injection, the pain will likely return.  It takes 3-7 days for steroids to work in the epidural space.  You may not notice any pain relief for at least that one week.  If effective, we will often do a series of three injections spaced 3-6 weeks apart to maximally decrease your pain.  After the initial series, we generally will wait several months before considering a repeat injection of the same type.  If you have any questions, please call [phone] [facility] 

## 2022-01-01 NOTE — Progress Notes (Signed)
PROVIDER NOTE: Information contained herein reflects review and annotations entered in association with encounter. Interpretation of such information and data should be left to medically-trained personnel. Information provided to patient can be located elsewhere in the medical record under "Patient Instructions". Document created using STT-dictation technology, any transcriptional errors that may result from process are unintentional.    Patient: Devin Mayer  Service Category: E/M  Provider: Gillis Santa, MD  DOB: Oct 15, 1955  DOS: 01/01/2022  Referring Provider: Nobie Putnam *  MRN: [id number]  Specialty: Interventional Pain Management  PCP: Gladstone Lighter, MD  Type: Established Patient  Setting: Ambulatory outpatient    Location: Office  Delivery: Face-to-face     HPI  [name], a 66 y.o. year old male, is here today because of his Chronic radicular lumbar pain [M54.16, G89.29]. [name] primary complain today is Knee Pain (left) and Hip Pain (left) Last encounter: My last encounter with him was on 12/17/2021. Pertinent problems: 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 Pain Assessment: Severity of Chronic pain is reported as a 9 /10. Location:   Left/ . Onset: More than a month ago. Quality: Aching, Dull. Timing: Constant. Modifying factor(s): rest. Vitals:  height is '5\' 6"'  (1.676 m) and weight is 168 lb (76.2 kg). His temperature is 97.3 F (36.3 C) (abnormal). His blood pressure is 139/73 and his pulse is 95. His respiration is 20 and oxygen saturation is 97%.   Reason for encounter: post-procedure evaluation and assessment.    Post-procedure evaluation   Type: Lumbar Facet, Medial Branch Block(s) #1  Laterality:  Left  Level: L3, L4, L5,  Medial Branch Level(s). Injecting these levels blocks the L3-4 L4-5 lumbar facet joints. Imaging: Fluoroscopic guidance         Anesthesia: Local anesthesia (1-2% Lidocaine) Anxiolysis: Oral Valium 10 mg Sedation:  minimal . DOS: 12/17/2021 Performed by: Gillis Santa, MD  Primary Purpose: Diagnostic/Therapeutic Indications: Low back pain severe enough to impact quality of life or function. 1. Lumbar facet arthropathy   2. Lumbar facet joint syndrome   3. Chronic pain syndrome    NAS-11 Pain score:   Pre-procedure: 9 /10   Post-procedure: 8/10      Effectiveness:  Initial hour after procedure: 100 %  Subsequent 4-6 hours post-procedure: 100 %  Analgesia past initial 6 hours: 100 % (ongoing)  Ongoing improvement:  Analgesic:  100% Function: [name] reports improvement in function ROM: [name] reports improvement in ROM   ROS  Constitutional: Denies any fever or chills Gastrointestinal: No reported hemesis, hematochezia, vomiting, or acute GI distress Musculoskeletal: Denies any acute onset joint swelling, redness, loss of ROM, or weakness Neurological:  Low back pain with radiation into left leg  Medication Review  Aspirin-Caffeine, HYDROcodone-acetaminophen, albuterol, clonazePAM, diclofenac, famotidine, gabapentin, insulin glargine, metFORMIN, nortriptyline, pantoprazole, potassium chloride SA, pravastatin, sitaGLIPtin, tiZANidine, and triamcinolone cream  History Review  Allergy: [name] is allergic to latex. Drug: [name]  reports no history of drug use. Alcohol:  reports current alcohol use. Tobacco:  reports that he has been smoking cigars. He has never used smokeless tobacco. Social: [name]  reports that he has been smoking cigars. He has never used smokeless tobacco. He reports current alcohol use. He reports that he does not use drugs. Medical:  has  a past medical history of Cancer ([place]), COPD (chronic obstructive pulmonary  disease) ([place]), Diabetes mellitus without complication ([place]), Hyperlipidemia, Hypertension, and Stroke ([place]). Surgical: [name]  has a past surgical history that includes Shoulder surgery (Bilateral) and Cholecystectomy. Family: family history includes Diabetes in his father; Heart attack in his father and paternal uncle; Lung cancer in his maternal grandmother and paternal grandmother.  Laboratory Chemistry Profile   Renal Lab Results  Component Value Date   BUN 23 08/21/2021   CREATININE 1.35 (H) 08/21/2021   LABCREA 61 16/02/9603   BCR NOT APPLICABLE 54/01/8118   GFRAA >60 12/19/2016   GFRNONAA 59 (L) 08/21/2021    Hepatic Lab Results  Component Value Date   AST 37 08/21/2021   ALT 58 (H) 08/21/2021   ALBUMIN 4.2 08/21/2021   ALKPHOS 82 08/21/2021   LIPASE 48 08/21/2021    Electrolytes Lab Results  Component Value Date   NA 132 (L) 08/21/2021   K 4.8 08/21/2021   CL 96 (L) 08/21/2021   CALCIUM 9.3 08/21/2021    Bone No results found for: "VD25OH", "VD125OH2TOT", "JY7829FA2", "ZH0865HQ4", "25OHVITD1", "25OHVITD2", "25OHVITD3", "TESTOFREE", "TESTOSTERONE"  Inflammation (CRP: Acute Phase) (ESR: Chronic Phase) Lab Results  Component Value Date   CRP 2.3 (H) 07/10/2020   ESRSEDRATE 7 07/10/2020         Note: Above Lab results reviewed.  Recent Imaging Review  DG Knee Complete 4 Views Left CLINICAL DATA:  66 year old male with history of trauma from a fall with injury to the left knee. Left knee pain.  EXAM: LEFT KNEE - COMPLETE 4+ VIEW  COMPARISON:  No priors.  FINDINGS: Four views of the left knee demonstrate no acute displaced fracture, subluxation or dislocation. There is some joint space narrowing, subchondral sclerosis and osteophyte formation in a tricompartmental distribution (most severe in the medial and patellofemoral compartments), compatible with osteoarthritis.  IMPRESSION: 1. No acute radiographic abnormality of the left knee. 2.  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  Electronically Signed   By: Vinnie Langton M.D.   On: 12/23/2021 07:35 Note: Reviewed        Physical Exam  General appearance: Well nourished, well developed, and well hydrated. In no apparent acute distress Mental status: Alert, oriented x 3 (person, place, & time)       Respiratory: No evidence of acute respiratory distress Eyes: PERLA Vitals: BP 139/73   Pulse 95   Temp (!) 97.3 F (36.3 C)   Resp 20   Ht '5\' 6"'  (1.676 m)   Wt 168 lb (76.2 kg)   SpO2 97%   BMI 27.12 kg/m  BMI: Estimated body mass index is 27.12 kg/m as calculated from the following:   Height as of this encounter: '5\' 6"'  (1.676 m).   Weight as of this encounter: 168 lb (76.2 kg). Ideal: Ideal body weight: 63.8 kg (140 lb 10.5 oz) Adjusted ideal body weight: 68.8 kg (151 lb 9.5 oz)  Back Exam   Range of Motion  Extension:  normal  Flexion:  abnormal   Muscle Strength  The patient has normal back strength.  Other  Sensation: decreased  Comments:  Positive straight leg raise test for the left leg    .  Assessment   Diagnosis Status  1. Chronic radicular lumbar pain   2. 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 Chronic pain syndrome   4. Lumbar facet joint syndrome    Having a Flare-up Having  a Flare-up Having a Flare-up    Plan of Care  Problem-specific:  Lumbar facet joint syndrome Great response after left L3, L4, L5 medial branch nerve block.  100% pain relief that is ongoing.  Can consider repeating in the future and also considering lumbar radiofrequency ablation.  Will continue to monitor for the time being.  Chronic radicular lumbar pain Patient is having increased radiating left leg pain down his left buttock his posterior lateral thigh stopping at his lateral knee.  We discussed a left lumbar epidural steroid injection at left L4-L5.  Risk and benefits were  reviewed and patient would like to proceed.  I also recommend that he increase his gabapentin to 600 mg nightly.  [name] has a current medication list which includes the following long-term medication(s): albuterol, clonazepam, famotidine, lantus solostar, januvia, metformin, nortriptyline, pantoprazole, potassium chloride sa, pravastatin, and gabapentin.  Pharmacotherapy (Medications Ordered): Meds ordered this encounter  Medications   gabapentin (NEURONTIN) 300 MG capsule    Sig: 300 mg qAM, 300 mg qPM, 600 mg QHS    Dispense:  120 capsule    Refill:  2   Orders:  Orders Placed This Encounter  Procedures   Lumbar Epidural Injection    Standing Status:   Future    Standing Expiration Date:   04/03/2022    Scheduling Instructions:     Procedure: Interlaminar Lumbar Epidural Steroid injection (LESI)   Left L4/5 ESI     Laterality: LEFT     Sedation: PO Valium     Timeframe: ASAA    Order Specific Question:   Where will this procedure be performed?    Answer:   [facility]   Follow-up plan:   Return in about 2 weeks (around 01/15/2022) for Left L4/5 ESI , in clinic (PO Valium).     L5/S1 ESI 03/17/21, TF RIGHT L3 ESI 05/12/21, 06/04/21; Left L3,4,5, MBNB 12/17/21       Recent Visits Date Type Provider Dept  12/17/21 Procedure visit Gillis Santa, MD [facility]  11/20/21 Office Visit Gillis Santa, MD [facility]  Showing recent visits within past 90 days and meeting all other requirements Today's Visits Date Type Provider Dept  01/01/22 Office Visit Gillis Santa, MD [facility]  Showing today's visits and meeting all other requirements Future Appointments No visits were found meeting these conditions. Showing future appointments within next 90 days and meeting all other requirements  I discussed the assessment and treatment plan with the patient. The patient was provided an opportunity to ask questions and all were  answered. The patient agreed with the plan and demonstrated an understanding of the instructions.  Patient advised to call back or seek an in-person evaluation if the symptoms or condition worsens.  Duration of encounter: 35mnutes.  Total time on encounter, as per AMA guidelines included both the face-to-face and non-face-to-face time personally spent by the physician and/or other qualified health care professional(s) on the day of the encounter (includes time in activities that require the physician or other qualified health care professional and does not include time in activities normally performed by clinical staff). Physician's time may include the following activities when performed: preparing to see the patient (eg, review of tests, pre-charting review of records) obtaining and/or reviewing separately obtained history performing a medically appropriate examination and/or evaluation counseling and educating the patient/family/caregiver ordering medications, tests, or procedures referring and communicating with other health care professionals (when not separately reported) documenting clinical information in  the electronic or other health record independently interpreting results (not separately reported) and communicating results to the patient/ family/caregiver care coordination (not separately reported)  Note by: Gillis Santa, MD Date: 01/01/2022; Time: 11:02 AM

## 2022-01-02 ENCOUNTER — Other Ambulatory Visit: Payer: Self-pay | Admitting: Internal Medicine

## 2022-01-02 DIAGNOSIS — F17210 Nicotine dependence, cigarettes, uncomplicated: Secondary | ICD-10-CM

## 2022-01-02 DIAGNOSIS — Z0000 Encounter for general adult medical examination without abnormal findings: Secondary | ICD-10-CM

## 2022-01-04 ENCOUNTER — Other Ambulatory Visit: Payer: Self-pay | Admitting: Family Medicine

## 2022-01-04 DIAGNOSIS — E1140 Type 2 diabetes mellitus with diabetic neuropathy, unspecified: Secondary | ICD-10-CM

## 2022-01-05 NOTE — Telephone Encounter (Signed)
Requested medication (s) are due for refill today: yes  Requested medication (s) are on the active medication list: yes  Last refill:  07/02/21 #30 with 5 RF  Future visit scheduled: no, last seen 07/02/21, had PE with different provider recently and they are listed as his PCP.  Notes to clinic:  Unsure if pt has changed providers, please assess.      Requested Prescriptions  Pending Prescriptions Disp Refills   JANUVIA 50 MG tablet [Pharmacy Med Name: JANUVIA 50 MG TABLET] 30 tablet 5    Sig: TAKE 1 TABLET BY Corinne     Endocrinology:  Diabetes - DPP-4 Inhibitors Failed - 01/04/2022  1:33 AM      Failed - HBA1C is between 0 and 7.9 and within 180 days    Hemoglobin A1C  Date Value Ref Range Status  07/30/2020 7.8  Final   Hgb A1c MFr Bld  Date Value Ref Range Status  07/02/2021 8.4 (H) <5.7 % of total Hgb Final    Comment:    For someone without known diabetes, a hemoglobin A1c value of 6.5% or greater indicates that they may have  diabetes and this should be confirmed with a follow-up  test. . For someone with known diabetes, a value <7% indicates  that their diabetes is well controlled and a value  greater than or equal to 7% indicates suboptimal  control. A1c targets should be individualized based on  duration of diabetes, age, comorbid conditions, and  other considerations. . Currently, no consensus exists regarding use of hemoglobin A1c for diagnosis of diabetes for children. .          Failed - Cr in normal range and within 360 days    Creat  Date Value Ref Range Status  07/02/2021 1.20 0.70 - 1.35 mg/dL Final   Creatinine, Ser  Date Value Ref Range Status  08/21/2021 1.35 (H) 0.61 - 1.24 mg/dL Final   Creatinine, Urine  Date Value Ref Range Status  07/02/2021 61 20 - 320 mg/dL Final         Passed - Valid encounter within last 6 months    Recent Outpatient Visits           4 months ago RUQ abdominal pain   [place], [place]   6 months ago Type 2 diabetes mellitus with diabetic neuropathy, with long-term current use of insulin [facility])   [facility] Olin Hauser, DO   10 months ago [place], [place]

## 2022-01-06 ENCOUNTER — Other Ambulatory Visit: Payer: Self-pay | Admitting: Emergency Medicine

## 2022-01-08 ENCOUNTER — Ambulatory Visit
Admission: RE | Admit: 2022-01-08 | Discharge: 2022-01-08 | Disposition: A | Discharge status: Home / Self Care | Payer: Medicare PPO | Source: Ambulatory Visit | Attending: Internal Medicine | Admitting: Internal Medicine

## 2022-01-08 DIAGNOSIS — Z0000 Encounter for general adult medical examination without abnormal findings: Secondary | ICD-10-CM | POA: Insufficient documentation

## 2022-01-08 DIAGNOSIS — F17210 Nicotine dependence, cigarettes, uncomplicated: Secondary | ICD-10-CM | POA: Insufficient documentation

## 2022-01-12 ENCOUNTER — Telehealth: Payer: Self-pay | Admitting: Student in an Organized Health Care Education/Training Program

## 2022-01-12 NOTE — Telephone Encounter (Signed)
Pt is having so much pain in left knee he cant walk or sleep. Wants to know if Dr. Holley Raring will send in something for this pain

## 2022-01-13 ENCOUNTER — Ambulatory Visit: Payer: Medicare PPO | Admitting: Student in an Organized Health Care Education/Training Program

## 2022-01-13 ENCOUNTER — Other Ambulatory Visit (INDEPENDENT_AMBULATORY_CARE_PROVIDER_SITE_OTHER): Payer: Self-pay | Admitting: Emergency Medicine

## 2022-01-14 ENCOUNTER — Other Ambulatory Visit: Payer: Self-pay | Admitting: Emergency Medicine

## 2022-01-16 ENCOUNTER — Emergency Department: Payer: Medicare PPO

## 2022-01-16 ENCOUNTER — Other Ambulatory Visit: Payer: Self-pay

## 2022-01-16 ENCOUNTER — Emergency Department
Admission: EM | Admit: 2022-01-16 | Discharge: 2022-01-16 | Disposition: A | Discharge status: Home / Self Care | Payer: Medicare PPO | Attending: Emergency Medicine | Admitting: Emergency Medicine

## 2022-01-16 DIAGNOSIS — W19XXXA Unspecified fall, initial encounter: Secondary | ICD-10-CM | POA: Diagnosis not present

## 2022-01-16 DIAGNOSIS — G8929 Other chronic pain: Secondary | ICD-10-CM | POA: Insufficient documentation

## 2022-01-16 DIAGNOSIS — M25562 Pain in left knee: Secondary | ICD-10-CM | POA: Diagnosis present

## 2022-01-16 MED ORDER — KETOROLAC TROMETHAMINE 30 MG/ML IJ SOLN
30.0000 mg | Freq: Once | INTRAMUSCULAR | Status: AC
  Administered 2022-01-16: 30 mg via INTRAMUSCULAR
  Filled 2022-01-16: qty 1

## 2022-01-16 MED ORDER — MELOXICAM 7.5 MG PO TABS: 7.5000 mg | ORAL_TABLET | Freq: Every day | ORAL | 0 refills | Status: AC

## 2022-01-16 NOTE — ED Notes (Signed)
E-signature pad unavailable - Pt verbalized understanding of D/C information - no additional concerns at this time.  

## 2022-01-16 NOTE — ED Provider Notes (Signed)
[facility] Provider Note    Event Date/Time   First MD Initiated Contact with Patient 01/16/22 [phone]     (approximate)   History   Knee Pain   HPI  Devin Mayer is a 66 y.o. male who presents for evaluation of pain in his left knee.  He said it has been present for a long time but it is much worse and nobody will do anything about it.  He is moaning and rolling from side to side but calms down when no one is around.  His fiance is at the bedside and confirms that he has seen multiple doctors in different specialties but "no one will do nothing".  He is requesting a cortisone injection in his knee, although he said that the pain is, he believes, from his sciatic nerve.  He has occasional falls as a result of the pain in his knee.  He has had no recent trauma.  No redness or swelling.  No fever or shortness of breath.  No difficulty with urination.  No new back pain.     Physical Exam   Triage Vital Signs: ED Triage Vitals  Enc Vitals Group     BP 01/16/22 0114 (!) 168/71     Pulse Rate 01/16/22 0114 90     Resp 01/16/22 0114 18     Temp 01/16/22 0114 98.1 F (36.7 C)     Temp Source 01/16/22 0114 Oral     SpO2 01/16/22 0114 95 %     Weight --      Height --      Head Circumference --      Peak Flow --      Pain Score 01/16/22 0115 10     Pain Loc --      Pain Edu? --      Excl. in [place]? --     Most recent vital signs: Vitals:   01/16/22 0114 01/16/22 0415  BP: (!) 168/71 (!) 145/78  Pulse: 90 88  Resp: 18 16  Temp: 98.1 F (36.7 C)   SpO2: 95% 96%     General: Awake, patient will moan and roll from side to side in pain but be calm and cooperative while answering questions. CV:  Good peripheral perfusion.  Resp:  Normal effort.  Abd:  No distention.  Other:  Left and right knee are equal in appearance with no effusions, erythema, or visible nor palpable deformity.  Patient will flex and extend his knee both passively and actively  although he reports that it hurts to do so.  There is no specific localized tenderness to palpation.   ED Results / Procedures / Treatments   Labs (all labs ordered are listed, but only abnormal results are displayed) Labs Reviewed - No data to display    RADIOLOGY I viewed and interpreted the patient's knee x-rays and I see no evidence of acute fracture nor dislocation.    PROCEDURES:  Critical Care performed: No  Procedures   MEDICATIONS ORDERED IN ED: Medications  ketorolac (TORADOL) 30 MG/ML injection 30 mg (30 mg Intramuscular Given 01/16/22 0407)     IMPRESSION / MDM / ASSESSMENT AND PLAN / ED COURSE  I reviewed the triage vital signs and the nursing notes.                              Differential diagnosis includes, but is not limited to, chronic pain,  referred sciatic or radicular pain, arthritis, secondary gain.  Patient's presentation is most consistent with exacerbation of chronic illness.  I reviewed the medical record and reviewed notes from a visit to neurosurgery with Dr. Izora Ribas on 11/06/2021, orthopedics with Dr. Rogers Blocker on 12/01/2021, Dr. Netty Starring in 12/09/2021, and prior notes from Dr. Gillis Santa with pain management.  There is seems to be a general consensus that there is not a specific issue with his knee.  He may be having referred pain from his sciatic nerve but it is bit unclear.  Dr. Holley Raring is attempting to manage his chronic pain with injections and he has 1 planned within the next few weeks.  The orthopedic surgeon did not feel there was a specific problem with his knee and did not feel he would benefit from a joint injection within the knee itself.  I reminded the patient of all the specialty visits and the fact that the orthopedic surgeon did not feel that any injection would be appropriate.  I also explained that this is not typically a procedure done in the emergency department and that it has its own risks, particularly when there is no clear  indication it would be helpful.  This is clearly a chronic issue and he needs to follow-up with his outpatient providers.  There is no indication he is suffering from an acute or emergent medical condition.  I reviewed the [place] controlled substance database and he has a number of prescriptions for opioids and benzodiazepines from multiple different providers and I am concerned about the prescribing pattern and the possibility of secondary gain and drug-seeking behavior, although I have no doubt he is experiencing pain in his left knee.  I ordered Toradol 30 mg intramuscular ingestion and wrote him prescription for meloxicam.  I strongly encouraged she follow-up with his outpatient providers.  He and his fiance agreed with the plan.      FINAL CLINICAL IMPRESSION(S) / ED DIAGNOSES   Final diagnoses:  Chronic pain of left knee     Rx / DC Orders   ED Discharge Orders          Ordered    meloxicam (MOBIC) 7.5 MG tablet  Daily        01/16/22 0353             Note:  This document was prepared using Dragon voice recognition software and may include unintentional dictation errors.   Hinda Kehr, MD 01/16/22 [phone]

## 2022-01-16 NOTE — ED Triage Notes (Signed)
Pt presents to ER with c/o left knee pain that has been going on for several months.  Pt states he has fallen "several times a day."  No swelling noted.  Pt states he is having numbness all the way through his left leg.  Pt states he needs a cortisone shot in his knee.  Pt is A&O x4 at this time in NAD in triage.

## 2022-01-21 ENCOUNTER — Ambulatory Visit
Payer: Medicare PPO | Attending: Student in an Organized Health Care Education/Training Program | Admitting: Student in an Organized Health Care Education/Training Program

## 2022-01-21 ENCOUNTER — Ambulatory Visit
Admission: RE | Admit: 2022-01-21 | Discharge: 2022-01-21 | Disposition: A | Discharge status: Home / Self Care | Payer: Medicare PPO | Source: Ambulatory Visit | Attending: Student in an Organized Health Care Education/Training Program | Admitting: Student in an Organized Health Care Education/Training Program

## 2022-01-21 ENCOUNTER — Encounter: Payer: Self-pay | Admitting: Student in an Organized Health Care Education/Training Program

## 2022-01-21 DIAGNOSIS — G894 Chronic pain syndrome: Secondary | ICD-10-CM | POA: Diagnosis present

## 2022-01-21 DIAGNOSIS — M48061 Spinal stenosis, lumbar region without neurogenic claudication: Secondary | ICD-10-CM | POA: Diagnosis present

## 2022-01-21 DIAGNOSIS — G8929 Other chronic pain: Secondary | ICD-10-CM | POA: Diagnosis present

## 2022-01-21 DIAGNOSIS — M5416 Radiculopathy, lumbar region: Secondary | ICD-10-CM | POA: Diagnosis present

## 2022-01-21 MED ORDER — DIAZEPAM 5 MG PO TABS
ORAL_TABLET | ORAL | Status: AC
  Filled 2022-01-21: qty 1

## 2022-01-21 MED ORDER — SODIUM CHLORIDE 0.9% FLUSH
2.0000 mL | Freq: Once | INTRAVENOUS | Status: AC
  Administered 2022-01-21: 2 mL

## 2022-01-21 MED ORDER — IOHEXOL 180 MG/ML  SOLN
10.0000 mL | Freq: Once | INTRAMUSCULAR | Status: AC
  Administered 2022-01-21: 10 mL via EPIDURAL
  Filled 2022-01-21: qty 20

## 2022-01-21 MED ORDER — ROPIVACAINE HCL 2 MG/ML IJ SOLN
2.0000 mL | Freq: Once | INTRAMUSCULAR | Status: AC
  Administered 2022-01-21: 2 mL via EPIDURAL
  Filled 2022-01-21: qty 20

## 2022-01-21 MED ORDER — DEXAMETHASONE SODIUM PHOSPHATE 10 MG/ML IJ SOLN
10.0000 mg | Freq: Once | INTRAMUSCULAR | Status: AC
Start: 2022-01-21 — End: 2022-01-21
  Administered 2022-01-21: 10 mg
  Filled 2022-01-21: qty 1

## 2022-01-21 MED ORDER — LIDOCAINE HCL 2 % IJ SOLN
20.0000 mL | Freq: Once | INTRAMUSCULAR | Status: AC
  Administered 2022-01-21: 400 mg
  Filled 2022-01-21: qty 20

## 2022-01-21 MED ORDER — DIAZEPAM 5 MG PO TABS
5.0000 mg | ORAL_TABLET | ORAL | Status: AC
  Administered 2022-01-21: 5 mg via ORAL

## 2022-01-21 NOTE — Progress Notes (Signed)
Safety precautions to be maintained throughout the outpatient stay will include: orient to surroundings, keep bed in low position, maintain call bell within reach at all times, provide assistance with transfer out of bed and ambulation.  

## 2022-01-21 NOTE — Patient Instructions (Addendum)
____________________________________________________________________________________________ Please discuss Beta blocker with your PCP on 02/02/22 fast heartrate 105-110    Post-Procedure Discharge Instructions  Instructions: Apply ice:  Purpose: This will minimize any swelling and discomfort after procedure.  When: Day of procedure, as soon as you get home. How: Fill a plastic sandwich bag with crushed ice. Cover it with a small towel and apply to injection site. How long: (15 min on, 15 min off) Apply for 15 minutes then remove x 15 minutes.  Repeat sequence on day of procedure, until you go to bed. Apply heat:  Purpose: To treat any soreness and discomfort from the procedure. When: Starting the next day after the procedure. How: Apply heat to procedure site starting the day following the procedure. How long: May continue to repeat daily, until discomfort goes away. Food intake: Start with clear liquids (like water) and advance to regular food, as tolerated.  Physical activities: Keep activities to a minimum for the first 8 hours after the procedure. After that, then as tolerated. Driving: If you have received any sedation, be responsible and do not drive. You are not allowed to drive for 24 hours after having sedation. Blood thinner: (Applies only to those taking blood thinners) You may restart your blood thinner 6 hours after your procedure. Insulin: (Applies only to Diabetic patients taking insulin) As soon as you can eat, you may resume your normal dosing schedule. Infection prevention: Keep procedure site clean and dry. Shower daily and clean area with soap and water. Post-procedure Pain Diary: Extremely important that this be done correctly and accurately. Recorded information will be used to determine the next step in treatment. For the purpose of accuracy, follow these rules: Evaluate only the area treated. Do not report or include pain from an untreated area. For the purpose of  this evaluation, ignore all other areas of pain, except for the treated area. After your procedure, avoid taking a long nap and attempting to complete the pain diary after you wake up. Instead, set your alarm clock to go off every hour, on the hour, for the initial 8 hours after the procedure. Document the duration of the numbing medicine, and the relief you are getting from it. Do not go to sleep and attempt to complete it later. It will not be accurate. If you received sedation, it is likely that you were given a medication that may cause amnesia. Because of this, completing the diary at a later time may cause the information to be inaccurate. This information is needed to plan your care. Follow-up appointment: Keep your post-procedure follow-up evaluation appointment after the procedure (usually 2 weeks for most procedures, 6 weeks for radiofrequencies). DO NOT FORGET to bring you pain diary with you.   Expect: (What should I expect to see with my procedure?) From numbing medicine (AKA: Local Anesthetics): Numbness or decrease in pain. You may also experience some weakness, which if present, could last for the duration of the local anesthetic. Onset: Full effect within 15 minutes of injected. Duration: It will depend on the type of local anesthetic used. On the average, 1 to 8 hours.  From steroids (Applies only if steroids were used): Decrease in swelling or inflammation. Once inflammation is improved, relief of the pain will follow. Onset of benefits: Depends on the amount of swelling present. The more swelling, the longer it will take for the benefits to be seen. In some cases, up to 10 days. Duration: Steroids will stay in the system x 2 weeks. Duration of  benefits will depend on multiple posibilities including persistent irritating factors. Side-effects: If present, they may typically last 2 weeks (the duration of the steroids). Frequent: Cramps (if they occur, drink Gatorade and take  over-the-counter Magnesium 450-500 mg once to twice a day); water retention with temporary weight gain; increases in blood sugar; decreased immune system response; increased appetite. Occasional: Facial flushing (red, warm cheeks); mood swings; menstrual changes. Uncommon: Long-term decrease or suppression of natural hormones; bone thinning. (These are more common with higher doses or more frequent use. This is why we prefer that our patients avoid having any injection therapies in other practices.)  Very Rare: Severe mood changes; psychosis; aseptic necrosis. From procedure: Some discomfort is to be expected once the numbing medicine wears off. This should be minimal if ice and heat are applied as instructed.  Call if: (When should I call?) You experience numbness and weakness that gets worse with time, as opposed to wearing off. New onset bowel or bladder incontinence. (Applies only to procedures done in the spine)  Emergency Numbers: Durning business hours (Monday - Thursday, 8:00 AM - 4:00 PM) (Friday, 9:00 AM - 12:00 Noon): [phone] After hours: [phone] NOTE: If you are having a problem and are unable connect with, or to talk to a provider, then go to your nearest urgent care or emergency department. If the problem is serious and urgent, please call 911. ____________________________________________________________________________________________

## 2022-01-21 NOTE — Progress Notes (Signed)
PROVIDER NOTE: Interpretation of information contained herein should be left to medically-trained personnel. Specific patient instructions are provided elsewhere under "Patient Instructions" section of medical record. This document was created in part using STT-dictation technology, any transcriptional errors that may result from this process are unintentional.  Patient: Devin Mayer Type: Established DOB: [birth year] MRN: [id number] PCP: Gladstone Lighter, MD  Service: Procedure DOS: 01/21/2022 Setting: Ambulatory Location: Ambulatory outpatient facility Delivery: Face-to-face Provider: Gillis Santa, MD Specialty: Interventional Pain Management Specialty designation: 09 Location: Outpatient facility Ref. Prov.: Gillis Santa, MD    Primary Reason for Visit: Interventional Pain Management Treatment. CC: Hip Pain (Left hip and goes down to the left ankle. )   Procedure:           Type: Lumbar epidural steroid injection (LESI) (interlaminar) #1    Laterality: Left   Level:  L4-5 Level.  Imaging: Fluoroscopic guidance         Anesthesia: Local anesthesia (1-2% Lidocaine) Anxiolysis: Oral Valium 5 mg Sedation: Minimal Sedation              .  DOS: 01/21/2022  Performed by: Gillis Santa, MD  Purpose: Diagnostic/Therapeutic Indications: Lumbar radicular pain of intraspinal etiology of more than 4 weeks that has failed to respond to conservative therapy and is severe enough to impact quality of life or function. 1. Chronic radicular lumbar pain   2. 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 Chronic pain syndrome    NAS-11 Pain score:   Pre-procedure: 8 /10   Post-procedure: 0-No pain/10      Position / Prep / Materials:  Position: Prone w/ head of the table raised (slight reverse trendelenburg) to facilitate breathing.  Prep solution: DuraPrep (Iodine Povacrylex [0.7% available iodine] and Isopropyl Alcohol, 74% w/w) Prep Area: Entire Posterior Lumbar Region  from lower scapular tip down to mid buttocks area and from flank to flank. Materials:  Tray: Epidural tray Needle(s):  Type: Epidural needle (Tuohy) Gauge (G):  22 Length: Regular (3.5-in) Qty: 1  Pre-op H&P Assessment:  Mr. Ferreras is a 66 y.o. (year old), male patient, seen today for interventional treatment. He  has a past surgical history that includes Shoulder surgery (Bilateral) and Cholecystectomy. Mr. Talton has a current medication list which includes the following prescription(s): albuterol, bayer back & body, clonazepam, diclofenac, famotidine, gabapentin, lantus solostar, januvia, meloxicam, metformin, nortriptyline, pantoprazole, potassium chloride sa, pravastatin, tizanidine, triamcinolone cream, and hydrocodone-acetaminophen. His primarily concern today is the Hip Pain (Left hip and goes down to the left ankle. )  Initial Vital Signs:  Pulse/HCG Rate:  (!) 107  Temp:  (!) 97.5 F (36.4 C) Resp: 16 BP: 127/80 SpO2: 98 %  BMI: Estimated body mass index is 27.76 kg/m as calculated from the following:   Height as of this encounter: '5\' 6"'$  (1.676 m).   Weight as of this encounter: 172 lb (78 kg).  Risk Assessment: Allergies: Reviewed. He is allergic to latex.  Allergy Precautions: None required Coagulopathies: Reviewed. None identified.  Blood-thinner therapy: None at this time Active Infection(s): Reviewed. None identified. Mr. Benavides is afebrile  Site Confirmation: Mr. Petersheim was asked to confirm the procedure and laterality before marking the site Procedure checklist: Completed Consent: Before the procedure and under the influence of no sedative(s), amnesic(s), or anxiolytics, the patient was informed of the treatment options, risks and possible complications. To fulfill our ethical and legal obligations, as recommended by the American Medical Association's Code of Ethics, I have informed  the patient of my clinical impression; the nature and purpose of the treatment or  procedure; the risks, benefits, and possible complications of the intervention; the alternatives, including doing nothing; the risk(s) and benefit(s) of the alternative treatment(s) or procedure(s); and the risk(s) and benefit(s) of doing nothing. The patient was provided information about the general risks and possible complications associated with the procedure. These may include, but are not limited to: failure to achieve desired goals, infection, bleeding, organ or nerve damage, allergic reactions, paralysis, and death. In addition, the patient was informed of those risks and complications associated to Spine-related procedures, such as failure to decrease pain; infection (i.e.: Meningitis, epidural or intraspinal abscess); bleeding (i.e.: epidural hematoma, subarachnoid hemorrhage, or any other type of intraspinal or peri-dural bleeding); organ or nerve damage (i.e.: Any type of peripheral nerve, nerve root, or spinal cord injury) with subsequent damage to sensory, motor, and/or autonomic systems, resulting in permanent pain, numbness, and/or weakness of one or several areas of the body; allergic reactions; (i.e.: anaphylactic reaction); and/or death. Furthermore, the patient was informed of those risks and complications associated with the medications. These include, but are not limited to: allergic reactions (i.e.: anaphylactic or anaphylactoid reaction(s)); adrenal axis suppression; blood sugar elevation that in diabetics may result in ketoacidosis or comma; water retention that in patients with history of congestive heart failure may result in shortness of breath, pulmonary edema, and decompensation with resultant heart failure; weight gain; swelling or edema; medication-induced neural toxicity; particulate matter embolism and blood vessel occlusion with resultant organ, and/or nervous system infarction; and/or aseptic necrosis of one or more joints. Finally, the patient was informed that Medicine is  not an exact science; therefore, there is also the possibility of unforeseen or unpredictable risks and/or possible complications that may result in a catastrophic outcome. The patient indicated having understood very clearly. We have given the patient no guarantees and we have made no promises. Enough time was given to the patient to ask questions, all of which were answered to the patient's satisfaction. Mr. Skelly has indicated that he wanted to continue with the procedure. Attestation: I, the ordering provider, attest that I have discussed with the patient the benefits, risks, side-effects, alternatives, likelihood of achieving goals, and potential problems during recovery for the procedure that I have provided informed consent. Date  Time: 01/21/2022 10:58 AM  Pre-Procedure Preparation:  Monitoring: As per clinic protocol. Respiration, ETCO2, SpO2, BP, heart rate and rhythm monitor placed and checked for adequate function Safety Precautions: Patient was assessed for positional comfort and pressure points before starting the procedure. Time-out: I initiated and conducted the "Time-out" before starting the procedure, as per protocol. The patient was asked to participate by confirming the accuracy of the "Time Out" information. Verification of the correct person, site, and procedure were performed and confirmed by me, the nursing staff, and the patient. "Time-out" conducted as per Joint Commission's Universal Protocol (UP.01.01.01). Time: 1127  Description/Narrative of Procedure:          Target: Epidural space via interlaminar opening, initially targeting the lower laminar border of the superior vertebral body. Region: Lumbar Approach: Percutaneous paravertebral  Rationale (medical necessity): procedure needed and proper for the diagnosis and/or treatment of the patient's medical symptoms and needs. Procedural Technique Safety Precautions: Aspiration looking for blood return was conducted prior to  all injections. At no point did we inject any substances, as a needle was being advanced. No attempts were made at seeking any paresthesias. Safe injection practices and needle  disposal techniques used. Medications properly checked for expiration dates. SDV (single dose vial) medications used. Description of the Procedure: Protocol guidelines were followed. The procedure needle was introduced through the skin, ipsilateral to the reported pain, and advanced to the target area. Bone was contacted and the needle walked caudad, until the lamina was cleared. The epidural space was identified using "loss-of-resistance technique" with 2-3 ml of PF-NaCl (0.9% NSS), in a 5cc LOR glass syringe.  6 cc solution made of 3 cc of preservative-free saline, 2 cc of 0.2% ropivacaine, 1 cc of Decadron 10 mg/cc.   Vitals:   01/21/22 1101 01/21/22 1121 01/21/22 1127 01/21/22 1132  BP: 127/80 (!) 127/98 127/84 132/86  Pulse: (!) 107 (!) 105 (!) 104 (!) 106  Resp: 16 (!) 21 (!) 24 (!) 23  Temp: (!) 97.5 F (36.4 C)     TempSrc: Temporal     SpO2: 98% 96% 97% 96%  Weight: 172 lb (78 kg)     Height: '5\' 6"'$  (1.676 m)       Start Time: 1127 hrs. End Time: 1133 hrs.  Imaging Guidance (Spinal):          Type of Imaging Technique: Fluoroscopy Guidance (Spinal) Indication(s): Assistance in needle guidance and placement for procedures requiring needle placement in or near specific anatomical locations not easily accessible without such assistance. Exposure Time: Please see nurses notes. Contrast: Before injecting any contrast, we confirmed that the patient did not have an allergy to iodine, shellfish, or radiological contrast. Once satisfactory needle placement was completed at the desired level, radiological contrast was injected. Contrast injected under live fluoroscopy. No contrast complications. See chart for type and volume of contrast used. Fluoroscopic Guidance: I was personally present during the use of  fluoroscopy. "Tunnel Vision Technique" used to obtain the best possible view of the target area. Parallax error corrected before commencing the procedure. "Direction-depth-direction" technique used to introduce the needle under continuous pulsed fluoroscopy. Once target was reached, antero-posterior, oblique, and lateral fluoroscopic projection used confirm needle placement in all planes. Images permanently stored in EMR. Interpretation: I personally interpreted the imaging intraoperatively. Adequate needle placement confirmed in multiple planes. Appropriate spread of contrast into desired area was observed. No evidence of afferent or efferent intravascular uptake. No intrathecal or subarachnoid spread observed. Permanent images saved into the patient's record.  Post-operative Assessment:  Post-procedure Vital Signs:  Pulse/HCG Rate:  (!) 106  Temp:  (!) 97.5 F (36.4 C) Resp: (!) 23 BP: 132/86 SpO2: 96 %  EBL: None  Complications: No immediate post-treatment complications observed by team, or reported by patient.  Note: The patient tolerated the entire procedure well. A repeat set of vitals were taken after the procedure and the patient was kept under observation following institutional policy, for this type of procedure. Post-procedural neurological assessment was performed, showing return to baseline, prior to discharge. The patient was provided with post-procedure discharge instructions, including a section on how to identify potential problems. Should any problems arise concerning this procedure, the patient was given instructions to immediately contact us, at any time, without hesitation. In any case, we plan to contact the patient by telephone for a follow-up status report regarding this interventional procedure.  Comments:  No additional relevant information.  Plan of Care  Orders:  Orders Placed This Encounter  Procedures   DG PAIN [facility] C-ARM 1-60 MIN NO REPORT    Intraoperative  interpretation by procedural physician at [place].    Standing Status:   Standing  Number of Occurrences:   1    Order Specific Question:   Reason for exam:    Answer:   Assistance in needle guidance and placement for procedures requiring needle placement in or near specific anatomical locations not easily accessible without such assistance.     Medications ordered for procedure: Meds ordered this encounter  Medications   iohexol (OMNIPAQUE) 180 MG/ML injection 10 mL    Must be Myelogram-compatible. If not available, you may substitute with a water-soluble, non-ionic, hypoallergenic, myelogram-compatible radiological contrast medium.   lidocaine (XYLOCAINE) 2 % (with pres) injection 400 mg   diazepam (VALIUM) tablet 5 mg    Make sure Flumazenil is available in the pyxis when using this medication. If oversedation occurs, administer 0.2 mg IV over 15 sec. If after 45 sec no response, administer 0.2 mg again over 1 min; may repeat at 1 min intervals; not to exceed 4 doses (1 mg)   sodium chloride flush (NS) 0.9 % injection 2 mL   ropivacaine (PF) 2 mg/mL (0.2%) (NAROPIN) injection 2 mL   dexamethasone (DECADRON) injection 10 mg   Medications administered: We administered iohexol, lidocaine, diazepam, sodium chloride flush, ropivacaine (PF) 2 mg/mL (0.2%), and dexamethasone.  See the medical record for exact dosing, route, and time of administration.  Follow-up plan:   Return in about 4 weeks (around 02/18/2022) for Post Procedure Evaluation, in person.       L5/S1 ESI 03/17/21, TF RIGHT L3 ESI 05/12/21, 06/04/21; Left L3,4,5, MBNB 12/17/21, Left L4/5 ESI 01/21/22       Recent Visits Date Type Provider Dept  01/01/22 Office Visit Gillis Santa, MD [facility]  12/17/21 Procedure visit Gillis Santa, MD [facility]  11/20/21 Office Visit Gillis Santa, MD [facility]  Showing recent visits within past 90 days and meeting all other  requirements Today's Visits Date Type Provider Dept  01/21/22 Procedure visit Gillis Santa, MD [facility]  Showing today's visits and meeting all other requirements Future Appointments Date Type Provider Dept  02/18/22 Appointment Gillis Santa, MD [facility]  Showing future appointments within next 90 days and meeting all other requirements  Disposition: Discharge home  Discharge (Date  Time): 01/21/2022; 1137 hrs.   Primary Care Physician: Gladstone Lighter, MD Location: [facility] Note by: Gillis Santa, MD Date: 01/21/2022; Time: 1:57 PM  Disclaimer:  Medicine is not an exact science. The only guarantee in medicine is that nothing is guaranteed. It is important to note that the decision to proceed with this intervention was based on the information collected from the patient. The Data and conclusions were drawn from the patient's questionnaire, the interview, and the physical examination. Because the information was provided in large part by the patient, it cannot be guaranteed that it has not been purposely or unconsciously manipulated. Every effort has been made to obtain as much relevant data as possible for this evaluation. It is important to note that the conclusions that lead to this procedure are derived in large part from the available data. Always take into account that the treatment will also be dependent on availability of resources and existing treatment guidelines, considered by other Pain Management Practitioners as being common knowledge and practice, at the time of the intervention. For Medico-Legal purposes, it is also important to point out that variation in procedural techniques and pharmacological choices are the acceptable norm. The indications, contraindications, technique, and results of the above procedure should only be interpreted and judged  by a Board-Certified Interventional Pain Specialist with extensive  familiarity and expertise in the same exact procedure and technique.

## 2022-01-22 ENCOUNTER — Telehealth: Payer: Self-pay

## 2022-01-22 NOTE — Telephone Encounter (Signed)
Post procedure phone call.  Patient states he is doing good.  

## 2022-02-18 ENCOUNTER — Encounter: Payer: Self-pay | Admitting: Student in an Organized Health Care Education/Training Program

## 2022-02-18 ENCOUNTER — Ambulatory Visit
Payer: 59 | Attending: Student in an Organized Health Care Education/Training Program | Admitting: Student in an Organized Health Care Education/Training Program

## 2022-02-18 VITALS — BP 139/68 | HR 85 | Temp 97.1°F | Ht 66.0 in | Wt 172.0 lb

## 2022-02-18 DIAGNOSIS — M5416 Radiculopathy, lumbar region: Secondary | ICD-10-CM | POA: Diagnosis present

## 2022-02-18 DIAGNOSIS — M48061 Spinal stenosis, lumbar region without neurogenic claudication: Secondary | ICD-10-CM | POA: Diagnosis present

## 2022-02-18 DIAGNOSIS — G8929 Other chronic pain: Secondary | ICD-10-CM | POA: Insufficient documentation

## 2022-02-18 DIAGNOSIS — G894 Chronic pain syndrome: Secondary | ICD-10-CM | POA: Insufficient documentation

## 2022-02-18 MED ORDER — PREGABALIN 50 MG PO CAPS: 50.0000 mg | ORAL_CAPSULE | Freq: Three times a day (TID) | ORAL | 0 refills | Status: DC

## 2022-02-18 NOTE — Progress Notes (Signed)
Safety precautions to be maintained throughout the outpatient stay will include: orient to surroundings, keep bed in low position, maintain call bell within reach at all times, provide assistance with transfer out of bed and ambulation.  

## 2022-02-18 NOTE — Progress Notes (Signed)
PROVIDER NOTE: Information contained herein reflects review and annotations entered in association with encounter. Interpretation of such information and data should be left to medically-trained personnel. Information provided to patient can be located elsewhere in the medical record under "Patient Instructions". Document created using STT-dictation technology, any transcriptional errors that may result from process are unintentional.    Patient: Devin Mayer  Service Category: E/M  Provider: Gillis Santa, MD  DOB: [birth year]  DOS: 02/18/2022  Referring Provider: Gladstone Lighter, MD  MRN: [id number]  Specialty: Interventional Pain Management  PCP: Gladstone Lighter, MD  Type: Established Patient  Setting: Ambulatory outpatient    Location: Office  Delivery: Face-to-face     HPI  Mr. Erol Flanagin, a 66 y.o. year old male, is here today because of his Chronic radicular lumbar pain [M54.16, G89.29]. Mr. Guillet primary complain today is Hip Pain Last encounter: My last encounter with him was on 01/21/2022. Pertinent problems: Mr. 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 Pain Assessment: Severity of Chronic pain is reported as a 10-Worst pain ever/10. Location: Hip Left/pain radiaties from his hip to groin and down his leg to his foot. Onset: More than a month ago. Quality: Aching, Burning, Constant, Pressure, Throbbing, Sharp, Numbness, Radiating, Cramping. Timing: Constant. Modifying factor(s): nothing. Vitals:  height is '5\' 6"'  (1.676 m) and weight is 172 lb (78 kg). His temperature is 97.1 F (36.2 C) (abnormal). His blood pressure is 139/68 and his pulse is 85. His oxygen saturation is 99%.   Reason for encounter: post-procedure evaluation and  assessment.    Post-procedure evaluation   Type: Lumbar epidural steroid injection (LESI) (interlaminar) #1    Laterality: Left   Level:  L4-5 Level.  Imaging: Fluoroscopic guidance         Anesthesia: Local anesthesia (1-2% Lidocaine) Anxiolysis: Oral Valium 5 mg Sedation: Minimal Sedation              .  DOS: 01/21/2022  Performed by: Gillis Santa, MD  Purpose: Diagnostic/Therapeutic Indications: Lumbar radicular pain of intraspinal etiology of more than 4 weeks that has failed to respond to conservative therapy and is severe enough to impact quality of life or function. 1. Chronic radicular lumbar pain   2. 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 Chronic pain syndrome    NAS-11 Pain score:   Pre-procedure: 8 /10   Post-procedure: 0-No pain/10      Effectiveness:  Initial hour after procedure: 100 %  Subsequent 4-6 hours post-procedure: 100 %  Analgesia past initial 6 hours: 100 %  Ongoing improvement:  Analgesic:  75-80% Function: Mr. Spisak reports improvement in function ROM: Mr. Gehrig reports improvement in ROM   ROS  Constitutional: Denies any fever or chills Gastrointestinal: No reported hemesis, hematochezia, vomiting, or acute GI distress Musculoskeletal:  Radiating left leg pain Neurological: No reported episodes of acute onset apraxia, aphasia, dysarthria, agnosia, amnesia, paralysis, loss of coordination, or loss of consciousness  Medication Review  Aspirin-Caffeine, albuterol, clonazePAM, diclofenac, famotidine, insulin glargine, metFORMIN, nortriptyline, pantoprazole, potassium chloride SA, pravastatin, pregabalin, sitaGLIPtin, tiZANidine, and triamcinolone cream  History Review  Allergy: Mr. Reamy is allergic to latex. Drug: Mr. Lazalde  reports no history of drug use. Alcohol:  reports current alcohol use. Tobacco:  reports that he has been  smoking cigars. He has never used smokeless tobacco. Social: Mr. Duman  reports that he  has been smoking cigars. He has never used smokeless tobacco. He reports current alcohol use. He reports that he does not use drugs. Medical:  has a past medical history of Cancer ([place]), COPD (chronic obstructive pulmonary disease) ([place]), Diabetes mellitus without complication ([place]), Hyperlipidemia, Hypertension, and Stroke ([place]). Surgical: Mr. Koral  has a past surgical history that includes Shoulder surgery (Bilateral) and Cholecystectomy. Family: family history includes Diabetes in his father; Heart attack in his father and paternal uncle; Lung cancer in his maternal grandmother and paternal grandmother.  Laboratory Chemistry Profile   Renal Lab Results  Component Value Date   BUN 23 08/21/2021   CREATININE 1.35 (H) 08/21/2021   LABCREA 61 88/89/1694   BCR NOT APPLICABLE 50/38/8828   GFRAA >60 12/19/2016   GFRNONAA 59 (L) 08/21/2021    Hepatic Lab Results  Component Value Date   AST 37 08/21/2021   ALT 58 (H) 08/21/2021   ALBUMIN 4.2 08/21/2021   ALKPHOS 82 08/21/2021   LIPASE 48 08/21/2021    Electrolytes Lab Results  Component Value Date   NA 132 (L) 08/21/2021   K 4.8 08/21/2021   CL 96 (L) 08/21/2021   CALCIUM 9.3 08/21/2021    Bone No results found for: "VD25OH", "VD125OH2TOT", "MK3491PH1", "TA5697XY8", "25OHVITD1", "25OHVITD2", "25OHVITD3", "TESTOFREE", "TESTOSTERONE"  Inflammation (CRP: Acute Phase) (ESR: Chronic Phase) Lab Results  Component Value Date   CRP 2.3 (H) 07/10/2020   ESRSEDRATE 7 07/10/2020         Note: Above Lab results reviewed.  Recent Imaging Review  DG PAIN CLINIC C-ARM 1-60 MIN NO REPORT Fluoro was used, but no Radiologist interpretation will be provided.  Please refer to "NOTES" tab for provider progress note. Note: Reviewed        Physical Exam  General appearance: Well nourished, well developed, and well hydrated. In no apparent acute distress Mental status: Alert, oriented x 3 (person, place, & time)       Respiratory: No  evidence of acute respiratory distress Eyes: PERLA Vitals: BP 139/68   Pulse 85   Temp (!) 97.1 F (36.2 C)   Ht '5\' 6"'  (1.676 m)   Wt 172 lb (78 kg)   SpO2 99%   BMI 27.76 kg/m  BMI: Estimated body mass index is 27.76 kg/m as calculated from the following:   Height as of this encounter: '5\' 6"'  (1.676 m).   Weight as of this encounter: 172 lb (78 kg). Ideal: Ideal body weight: 63.8 kg (140 lb 10.5 oz) Adjusted ideal body weight: 69.5 kg (153 lb 3.1 oz)  Back Exam    Range of Motion  Extension:  normal  Flexion:  abnormal    Muscle Strength  The patient has normal back strength.   Other  Sensation: decreased   Comments:  Positive straight leg raise test for the left leg    Assessment   Diagnosis Status  1. Chronic radicular lumbar pain   2. 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 Chronic pain syndrome    Responding Responding Persistent      Plan of Care    Mr. Jasim Harari has a current medication list which includes the following long-term medication(s): clonazepam, famotidine, lantus solostar, januvia, metformin, nortriptyline, pantoprazole, potassium chloride sa, pravastatin, pregabalin, and albuterol.  Pharmacotherapy (Medications Ordered):  Discontinue gabapentin.  Start Lyrica as below.  Meds ordered this encounter  Medications  pregabalin (LYRICA) 50 MG capsule    Sig: Take 1 capsule (50 mg total) by mouth 3 (three) times daily.    Dispense:  90 capsule    Refill:  0    Fill one day early if pharmacy is closed on scheduled refill date. May substitute for generic if available.   Orders:   Repeat left L4-L5 ESI  Orders Placed This Encounter  Procedures   Lumbar Epidural Injection    Standing Status:   Future    Standing Expiration Date:   05/20/2022    Scheduling Instructions:     Procedure: Interlaminar Lumbar Epidural Steroid injection (LESI)            Laterality: LEFT L4/5     Sedation: PO Valium     Timeframe:  ASAA    Order Specific Question:   Where will this procedure be performed?    Answer:   [facility]   Follow-up plan:   Return in about 2 weeks (around 03/04/2022) for L-ESI, in clinic (PO Valium).     L5/S1 ESI 03/17/21, TF RIGHT L3 ESI 05/12/21, 06/04/21; Left L3,4,5, MBNB 12/17/21, Left L4/5 ESI 01/21/22        Recent Visits Date Type Provider Dept  01/21/22 Procedure visit Gillis Santa, MD [facility]  01/01/22 Office Visit Gillis Santa, MD [facility]  12/17/21 Procedure visit Gillis Santa, MD [facility]  11/20/21 Office Visit Gillis Santa, MD [facility]  Showing recent visits within past 90 days and meeting all other requirements Today's Visits Date Type Provider Dept  02/18/22 Office Visit Gillis Santa, MD [facility]  Showing today's visits and meeting all other requirements Future Appointments No visits were found meeting these conditions. Showing future appointments within next 90 days and meeting all other requirements  I discussed the assessment and treatment plan with the patient. The patient was provided an opportunity to ask questions and all were answered. The patient agreed with the plan and demonstrated an understanding of the instructions.  Patient advised to call back or seek an in-person evaluation if the symptoms or condition worsens.  Duration of encounter: 50mnutes.  Total time on encounter, as per AMA guidelines included both the face-to-face and non-face-to-face time personally spent by the physician and/or other qualified health care professional(s) on the day of the encounter (includes time in activities that require the physician or other qualified health care professional and does not include time in activities normally performed by clinical staff). Physician's time may include the following activities when performed: preparing to see the patient (eg, review of tests, pre-charting review of  records) obtaining and/or reviewing separately obtained history performing a medically appropriate examination and/or evaluation counseling and educating the patient/family/caregiver ordering medications, tests, or procedures referring and communicating with other health care professionals (when not separately reported) documenting clinical information in the electronic or other health record independently interpreting results (not separately reported) and communicating results to the patient/ family/caregiver care coordination (not separately reported)  Note by: BGillis Santa MD Date: 02/18/2022; Time: 3:04 PM

## 2022-03-11 ENCOUNTER — Ambulatory Visit
Admission: RE | Admit: 2022-03-11 | Discharge: 2022-03-11 | Disposition: A | Discharge status: Home / Self Care | Payer: Medicaid Other | Source: Ambulatory Visit | Attending: Student in an Organized Health Care Education/Training Program | Admitting: Student in an Organized Health Care Education/Training Program

## 2022-03-11 ENCOUNTER — Ambulatory Visit
Payer: 59 | Attending: Student in an Organized Health Care Education/Training Program | Admitting: Student in an Organized Health Care Education/Training Program

## 2022-03-11 ENCOUNTER — Encounter: Payer: Self-pay | Admitting: Student in an Organized Health Care Education/Training Program

## 2022-03-11 VITALS — BP 126/76 | HR 94 | Temp 97.1°F | Resp 28 | Ht 66.0 in | Wt 169.0 lb

## 2022-03-11 DIAGNOSIS — G8929 Other chronic pain: Secondary | ICD-10-CM

## 2022-03-11 DIAGNOSIS — M5416 Radiculopathy, lumbar region: Secondary | ICD-10-CM | POA: Diagnosis present

## 2022-03-11 DIAGNOSIS — M48061 Spinal stenosis, lumbar region without neurogenic claudication: Secondary | ICD-10-CM | POA: Diagnosis present

## 2022-03-11 DIAGNOSIS — G894 Chronic pain syndrome: Secondary | ICD-10-CM | POA: Diagnosis present

## 2022-03-11 MED ORDER — DIAZEPAM 5 MG PO TABS
5.0000 mg | ORAL_TABLET | ORAL | Status: AC
  Administered 2022-03-11: 5 mg via ORAL

## 2022-03-11 MED ORDER — LIDOCAINE HCL 2 % IJ SOLN
20.0000 mL | Freq: Once | INTRAMUSCULAR | Status: AC
  Administered 2022-03-11: 400 mg
  Filled 2022-03-11: qty 20

## 2022-03-11 MED ORDER — DIAZEPAM 5 MG PO TABS
ORAL_TABLET | ORAL | Status: AC
  Filled 2022-03-11: qty 1

## 2022-03-11 MED ORDER — DEXAMETHASONE SODIUM PHOSPHATE 10 MG/ML IJ SOLN
10.0000 mg | Freq: Once | INTRAMUSCULAR | Status: AC
  Administered 2022-03-11: 10 mg
  Filled 2022-03-11: qty 1

## 2022-03-11 MED ORDER — ROPIVACAINE HCL 2 MG/ML IJ SOLN
2.0000 mL | Freq: Once | INTRAMUSCULAR | Status: AC
  Administered 2022-03-11: 2 mL via EPIDURAL
  Filled 2022-03-11: qty 20

## 2022-03-11 MED ORDER — IOHEXOL 180 MG/ML  SOLN
10.0000 mL | Freq: Once | INTRAMUSCULAR | Status: AC
  Administered 2022-03-11: 10 mL via EPIDURAL
  Filled 2022-03-11: qty 20

## 2022-03-11 MED ORDER — SODIUM CHLORIDE (PF) 0.9 % IJ SOLN
INTRAMUSCULAR | Status: AC
  Filled 2022-03-11: qty 10

## 2022-03-11 MED ORDER — SODIUM CHLORIDE 0.9% FLUSH
2.0000 mL | Freq: Once | INTRAVENOUS | Status: AC
  Administered 2022-03-11: 2 mL

## 2022-03-11 NOTE — Progress Notes (Signed)
Safety precautions to be maintained throughout the outpatient stay will include: orient to surroundings, keep bed in low position, maintain call bell within reach at all times, provide assistance with transfer out of bed and ambulation.  

## 2022-03-11 NOTE — Progress Notes (Signed)
PROVIDER NOTE: Interpretation of information contained herein should be left to medically-trained personnel. Specific patient instructions are provided elsewhere under "Patient Instructions" section of medical record. This document was created in part using STT-dictation technology, any transcriptional errors that may result from this process are unintentional.  Patient: Devin Mayer Type: Established DOB: 1956-04-03 MRN: [id number] PCP: Gladstone Lighter, MD  Service: Procedure DOS: 03/11/2022 Setting: Ambulatory Location: Ambulatory outpatient facility Delivery: Face-to-face Provider: Gillis Santa, MD Specialty: Interventional Pain Management Specialty designation: 09 Location: Outpatient facility Ref. Prov.: Gillis Santa, MD    Primary Reason for Visit: Interventional Pain Management Treatment. CC: Hip Pain (left)   Procedure:           Type: Lumbar epidural steroid injection (LESI) (interlaminar) #2    Laterality: Left   Level:  L4-5 Level.  Imaging: Fluoroscopic guidance         Anesthesia: Local anesthesia (1-2% Lidocaine) Anxiolysis: Oral Valium 5 mg DOS: 03/11/2022  Performed by: Gillis Santa, MD  Purpose: Diagnostic/Therapeutic Indications: Lumbar radicular pain of intraspinal etiology of more than 4 weeks that has failed to respond to conservative therapy and is severe enough to impact quality of life or function. 1. Chronic radicular lumbar pain   2. 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 Chronic pain syndrome    NAS-11 Pain score:   Pre-procedure: 9 /10   Post-procedure: 5 /10      Position / Prep / Materials:  Position: Prone w/ head of the table raised (slight reverse trendelenburg) to facilitate breathing.  Prep solution: DuraPrep (Iodine Povacrylex [0.7% available iodine] and Isopropyl Alcohol, 74% w/w) Prep Area: Entire Posterior Lumbar Region from lower scapular tip down to mid buttocks area and from flank to flank. Materials:   Tray: Epidural tray Needle(s):  Type: Epidural needle (Tuohy) Gauge (G):  22 Length: Regular (3.5-in) Qty: 1  Pre-op H&P Assessment:  Devin Mayer is a 66 y.o. (year old), male patient, seen today for interventional treatment. He  has a past surgical history that includes Shoulder surgery (Bilateral) and Cholecystectomy. Devin Mayer has a current medication list which includes the following prescription(s): [facility] [employer] body, clonazepam, diclofenac, famotidine, hydrochlorothiazide, lantus solostar, januvia, metformin, nortriptyline, pantoprazole, potassium chloride sa, pravastatin, pregabalin, tizanidine, triamcinolone cream, and albuterol. His primarily concern today is the Hip Pain (left)  Initial Vital Signs:  Pulse/HCG Rate:  94ECG Heart Rate: 99 Temp:  (!) 97.1 F (36.2 C) Resp: (!) 26 BP: 117/77 SpO2: 100 %  BMI: Estimated body mass index is 27.28 kg/m as calculated from the following:   Height as of this encounter: '5\' 6"'$  (1.676 m).   Weight as of this encounter: 169 lb (76.7 kg).  Risk Assessment: Allergies: Reviewed. He is allergic to latex.  Allergy Precautions: None required Coagulopathies: Reviewed. None identified.  Blood-thinner therapy: None at this time Active Infection(s): Reviewed. None identified. Devin Mayer is afebrile  Site Confirmation: Devin Mayer was asked to confirm the procedure and laterality before marking the site Procedure checklist: Completed Consent: Before the procedure and under the influence of no sedative(s), amnesic(s), or anxiolytics, the patient was informed of the treatment options, risks and possible complications. To fulfill our ethical and legal obligations, as recommended by the American Medical Association's Code of Ethics, I have informed the patient of my clinical impression; the nature and purpose of the treatment or procedure; the risks, benefits, and possible complications of the intervention; the alternatives, including doing nothing;  the risk(s) and benefit(s) of  the alternative treatment(s) or procedure(s); and the risk(s) and benefit(s) of doing nothing. The patient was provided information about the general risks and possible complications associated with the procedure. These may include, but are not limited to: failure to achieve desired goals, infection, bleeding, organ or nerve damage, allergic reactions, paralysis, and death. In addition, the patient was informed of those risks and complications associated to Spine-related procedures, such as failure to decrease pain; infection (i.e.: Meningitis, epidural or intraspinal abscess); bleeding (i.e.: epidural hematoma, subarachnoid hemorrhage, or any other type of intraspinal or peri-dural bleeding); organ or nerve damage (i.e.: Any type of peripheral nerve, nerve root, or spinal cord injury) with subsequent damage to sensory, motor, and/or autonomic systems, resulting in permanent pain, numbness, and/or weakness of one or several areas of the body; allergic reactions; (i.e.: anaphylactic reaction); and/or death. Furthermore, the patient was informed of those risks and complications associated with the medications. These include, but are not limited to: allergic reactions (i.e.: anaphylactic or anaphylactoid reaction(s)); adrenal axis suppression; blood sugar elevation that in diabetics may result in ketoacidosis or comma; water retention that in patients with history of congestive heart failure may result in shortness of breath, pulmonary edema, and decompensation with resultant heart failure; weight gain; swelling or edema; medication-induced neural toxicity; particulate matter embolism and blood vessel occlusion with resultant organ, and/or nervous system infarction; and/or aseptic necrosis of one or more joints. Finally, the patient was informed that Medicine is not an exact science; therefore, there is also the possibility of unforeseen or unpredictable risks and/or possible  complications that may result in a catastrophic outcome. The patient indicated having understood very clearly. We have given the patient no guarantees and we have made no promises. Enough time was given to the patient to ask questions, all of which were answered to the patient's satisfaction. Devin Mayer has indicated that he wanted to continue with the procedure. Attestation: I, the ordering provider, attest that I have discussed with the patient the benefits, risks, side-effects, alternatives, likelihood of achieving goals, and potential problems during recovery for the procedure that I have provided informed consent. Date  Time: 03/11/2022 10:21 AM  Pre-Procedure Preparation:  Monitoring: As per clinic protocol. Respiration, ETCO2, SpO2, BP, heart rate and rhythm monitor placed and checked for adequate function Safety Precautions: Patient was assessed for positional comfort and pressure points before starting the procedure. Time-out: I initiated and conducted the "Time-out" before starting the procedure, as per protocol. The patient was asked to participate by confirming the accuracy of the "Time Out" information. Verification of the correct person, site, and procedure were performed and confirmed by me, the nursing staff, and the patient. "Time-out" conducted as per Joint Commission's Universal Protocol (UP.01.01.01). Time: 1057  Description/Narrative of Procedure:          Target: Epidural space via interlaminar opening, initially targeting the lower laminar border of the superior vertebral body. Region: Lumbar Approach: Percutaneous paravertebral  Rationale (medical necessity): procedure needed and proper for the diagnosis and/or treatment of the patient's medical symptoms and needs. Procedural Technique Safety Precautions: Aspiration looking for blood return was conducted prior to all injections. At no point did we inject any substances, as a needle was being advanced. No attempts were made at  seeking any paresthesias. Safe injection practices and needle disposal techniques used. Medications properly checked for expiration dates. SDV (single dose vial) medications used. Description of the Procedure: Protocol guidelines were followed. The procedure needle was introduced through the skin, ipsilateral to the reported  pain, and advanced to the target area. Bone was contacted and the needle walked caudad, until the lamina was cleared. The epidural space was identified using "loss-of-resistance technique" with 2-3 ml of PF-NaCl (0.9% NSS), in a 5cc LOR glass syringe.  6 cc solution made of 3 cc of preservative-free saline, 2 cc of 0.2% ropivacaine, 1 cc of Decadron 10 mg/cc.   Vitals:   03/11/22 1033 03/11/22 1054 03/11/22 1058 03/11/22 1101  BP: 117/77 (!) 134/94 124/80 126/76  Pulse: 94     Resp:  (!) 26 (!) 27 (!) 28  Temp: (!) 97.1 F (36.2 C)     SpO2: 100% 95% 93% 93%  Weight: 169 lb (76.7 kg)     Height: '5\' 6"'$  (1.676 m)        Start Time: 1057 hrs. End Time: 1100 hrs.  Imaging Guidance (Spinal):          Type of Imaging Technique: Fluoroscopy Guidance (Spinal) Indication(s): Assistance in needle guidance and placement for procedures requiring needle placement in or near specific anatomical locations not easily accessible without such assistance. Exposure Time: Please see nurses notes. Contrast: Before injecting any contrast, we confirmed that the patient did not have an allergy to iodine, shellfish, or radiological contrast. Once satisfactory needle placement was completed at the desired level, radiological contrast was injected. Contrast injected under live fluoroscopy. No contrast complications. See chart for type and volume of contrast used. Fluoroscopic Guidance: I was personally present during the use of fluoroscopy. "Tunnel Vision Technique" used to obtain the best possible view of the target area. Parallax error corrected before commencing the procedure.  "Direction-depth-direction" technique used to introduce the needle under continuous pulsed fluoroscopy. Once target was reached, antero-posterior, oblique, and lateral fluoroscopic projection used confirm needle placement in all planes. Images permanently stored in EMR. Interpretation: I personally interpreted the imaging intraoperatively. Adequate needle placement confirmed in multiple planes. Appropriate spread of contrast into desired area was observed. No evidence of afferent or efferent intravascular uptake. No intrathecal or subarachnoid spread observed. Permanent images saved into the patient's record.  Post-operative Assessment:  Post-procedure Vital Signs:  Pulse/HCG Rate:  9499 Temp:  (!) 97.1 F (36.2 C) Resp: (!) 28 BP: 126/76 SpO2: 93 %  EBL: None  Complications: No immediate post-treatment complications observed by team, or reported by patient.  Note: The patient tolerated the entire procedure well. A repeat set of vitals were taken after the procedure and the patient was kept under observation following institutional policy, for this type of procedure. Post-procedural neurological assessment was performed, showing return to baseline, prior to discharge. The patient was provided with post-procedure discharge instructions, including a section on how to identify potential problems. Should any problems arise concerning this procedure, the patient was given instructions to immediately contact us, at any time, without hesitation. In any case, we plan to contact the patient by telephone for a follow-up status report regarding this interventional procedure.  Comments:  No additional relevant information.  Plan of Care  Orders:  Orders Placed This Encounter  Procedures   DG PAIN CLINIC C-ARM 1-60 MIN NO REPORT    Intraoperative interpretation by procedural physician at [place].    Standing Status:   Standing    Number of Occurrences:   1    Order Specific Question:    Reason for exam:    Answer:   Assistance in needle guidance and placement for procedures requiring needle placement in or near specific anatomical locations not easily accessible  without such assistance.     Medications ordered for procedure: Meds ordered this encounter  Medications   iohexol (OMNIPAQUE) 180 MG/ML injection 10 mL    Must be Myelogram-compatible. If not available, you may substitute with a water-soluble, non-ionic, hypoallergenic, myelogram-compatible radiological contrast medium.   lidocaine (XYLOCAINE) 2 % (with pres) injection 400 mg   sodium chloride flush (NS) 0.9 % injection 2 mL   ropivacaine (PF) 2 mg/mL (0.2%) (NAROPIN) injection 2 mL   dexamethasone (DECADRON) injection 10 mg   diazepam (VALIUM) tablet 5 mg    Make sure Flumazenil is available in the pyxis when using this medication. If oversedation occurs, administer 0.2 mg IV over 15 sec. If after 45 sec no response, administer 0.2 mg again over 1 min; may repeat at 1 min intervals; not to exceed 4 doses (1 mg)   Medications administered: We administered iohexol, lidocaine, sodium chloride flush, ropivacaine (PF) 2 mg/mL (0.2%), dexamethasone, and diazepam.  See the medical record for exact dosing, route, and time of administration.  Follow-up plan:   Return in about 4 weeks (around 04/08/2022) for Post Procedure Evaluation, virtual.       L5/S1 ESI 03/17/21, TF RIGHT L3 ESI 05/12/21, 06/04/21; Left L3,4,5, MBNB 12/17/21, Left L4/5 ESI 01/21/22, 03/11/22       Recent Visits Date Type Provider Dept  02/18/22 Office Visit Gillis Santa, MD [facility]  01/21/22 Procedure visit Gillis Santa, MD [facility]  01/01/22 Office Visit Gillis Santa, MD [facility]  12/17/21 Procedure visit Gillis Santa, MD [facility]  Showing recent visits within past 90 days and meeting all other requirements Today's Visits Date Type Provider Dept  03/11/22 Procedure visit Gillis Santa, MD [facility]  Showing today's visits and meeting all other requirements Future Appointments Date Type Provider Dept  04/08/22 Appointment Gillis Santa, MD [facility]  Showing future appointments within next 90 days and meeting all other requirements  Disposition: Discharge home  Discharge (Date  Time): 03/11/2022; 1107 hrs.   Primary Care Physician: Gladstone Lighter, MD Location: [facility] Note by: Gillis Santa, MD Date: 03/11/2022; Time: 11:12 AM  Disclaimer:  Medicine is not an exact science. The only guarantee in medicine is that nothing is guaranteed. It is important to note that the decision to proceed with this intervention was based on the information collected from the patient. The Data and conclusions were drawn from the patient's questionnaire, the interview, and the physical examination. Because the information was provided in large part by the patient, it cannot be guaranteed that it has not been purposely or unconsciously manipulated. Every effort has been made to obtain as much relevant data as possible for this evaluation. It is important to note that the conclusions that lead to this procedure are derived in large part from the available data. Always take into account that the treatment will also be dependent on availability of resources and existing treatment guidelines, considered by other Pain Management Practitioners as being common knowledge and practice, at the time of the intervention. For Medico-Legal purposes, it is also important to point out that variation in procedural techniques and pharmacological choices are the acceptable norm. The indications, contraindications, technique, and results of the above procedure should only be interpreted and judged by a Board-Certified Interventional Pain Specialist with extensive familiarity and expertise in the same exact procedure and technique.

## 2022-03-11 NOTE — Patient Instructions (Signed)

## 2022-03-12 ENCOUNTER — Telehealth: Payer: Self-pay

## 2022-03-12 ENCOUNTER — Other Ambulatory Visit: Payer: Self-pay | Admitting: Student in an Organized Health Care Education/Training Program

## 2022-03-12 DIAGNOSIS — G8929 Other chronic pain: Secondary | ICD-10-CM

## 2022-03-12 DIAGNOSIS — M48061 Spinal stenosis, lumbar region without neurogenic claudication: Secondary | ICD-10-CM

## 2022-03-12 DIAGNOSIS — G894 Chronic pain syndrome: Secondary | ICD-10-CM

## 2022-03-12 NOTE — Telephone Encounter (Signed)
Post procedure phone call. Patient states he is doing well.  

## 2022-03-19 ENCOUNTER — Other Ambulatory Visit: Payer: Self-pay | Admitting: Student in an Organized Health Care Education/Training Program

## 2022-03-19 ENCOUNTER — Telehealth: Payer: Self-pay | Admitting: Student in an Organized Health Care Education/Training Program

## 2022-03-19 DIAGNOSIS — M48061 Spinal stenosis, lumbar region without neurogenic claudication: Secondary | ICD-10-CM

## 2022-03-19 DIAGNOSIS — G8929 Other chronic pain: Secondary | ICD-10-CM

## 2022-03-19 DIAGNOSIS — G894 Chronic pain syndrome: Secondary | ICD-10-CM

## 2022-03-19 NOTE — Telephone Encounter (Signed)
PT friend called stated that patient is still in pain since he had the procedure. Wants to know what else can be done.Thanks

## 2022-03-19 NOTE — Telephone Encounter (Signed)
Returned patient call, LESI was performed on 03/11/22.  Explained that it is too early to evaluate LESI as the steroid will remain in the system x 14 days.  Devin Mayer reports that BL said that this procedure might help with leg weakness and instability and it did not help.  I told her that procedures do not always give the exact results that we hope for.  I told her to continue to monitor and use a walker or cane for stability and we will discuss with BL at the next appt 04/08/22.  Devin Mayer reports u/o information.

## 2022-03-23 ENCOUNTER — Encounter (INDEPENDENT_AMBULATORY_CARE_PROVIDER_SITE_OTHER): Payer: Self-pay

## 2022-03-26 ENCOUNTER — Ambulatory Visit (INDEPENDENT_AMBULATORY_CARE_PROVIDER_SITE_OTHER): Payer: 59 | Admitting: Urology

## 2022-03-26 ENCOUNTER — Other Ambulatory Visit: Payer: Self-pay | Admitting: Family Medicine

## 2022-03-26 ENCOUNTER — Encounter: Payer: Self-pay | Admitting: Urology

## 2022-03-26 VITALS — BP 121/75 | HR 88 | Ht 66.0 in | Wt 160.0 lb

## 2022-03-26 DIAGNOSIS — N529 Male erectile dysfunction, unspecified: Secondary | ICD-10-CM

## 2022-03-26 DIAGNOSIS — E1140 Type 2 diabetes mellitus with diabetic neuropathy, unspecified: Secondary | ICD-10-CM

## 2022-03-26 DIAGNOSIS — N200 Calculus of kidney: Secondary | ICD-10-CM

## 2022-03-26 DIAGNOSIS — C61 Malignant neoplasm of prostate: Secondary | ICD-10-CM

## 2022-03-26 DIAGNOSIS — Z8546 Personal history of malignant neoplasm of prostate: Secondary | ICD-10-CM | POA: Diagnosis not present

## 2022-03-26 MED ORDER — SILDENAFIL CITRATE 100 MG PO TABS: 100.0000 mg | ORAL_TABLET | Freq: Every day | ORAL | 3 refills | Status: DC | PRN

## 2022-03-26 NOTE — Addendum Note (Signed)
Addended by: Amado Coe on: 03/26/2022 04:18 PM   Modules accepted: Orders

## 2022-03-26 NOTE — Progress Notes (Signed)
   03/26/22 3:39 PM   Devin Mayer Mar 06, 1956 [id number]  CC: History of prostate cancer, nephrolithiasis, ED  HPI: 66 year old male referred for the above issues.  He was reportedly treated for prostate cancer by Dr. Eliberto Ivory in 2015 with brachytherapy, none of those records are available to me in terms of Gleason score or PSA.  PSA has been essentially undetectable since that time, most recently 0.01 in May 2023.  He has a history of nephrolithiasis, and required ureteroscopy at [facility] in 2021.  He denies any stone episodes since that time.  Recent CT in March 2023 showed small nonobstructive renal stones but no hydronephrosis.  He reports about a year of problems with ED, and has never tried medications for this.  He has chronic back pain, and really his primary complaint today is left leg pain that radiates from the low back down the back of his left leg with some numbness and tingling.   PMH: Past Medical History:  Diagnosis Date   Cancer ([employer])    COPD (chronic obstructive pulmonary disease) ([place])    Diabetes mellitus without complication ([place])    Hyperlipidemia    Hypertension    Stroke [facility])     Surgical History: Past Surgical History:  Procedure Laterality Date   CHOLECYSTECTOMY     SHOULDER SURGERY Bilateral     Family History: Family History  Problem Relation Age of Onset   Heart attack Father    Diabetes Father    Heart attack Paternal Uncle    Lung cancer Maternal Grandmother    Lung cancer Paternal Grandmother     Social History:  reports that he has been smoking cigars. He has never used smokeless tobacco. He reports current alcohol use. He reports that he does not use drugs.  Physical Exam: BP 121/75   Pulse 88   Ht '5\' 6"'$  (1.676 m)   Wt 160 lb (72.6 kg)   BMI 25.82 kg/m    Constitutional:  Alert and oriented, No acute distress. Cardiovascular: No clubbing, cyanosis, or edema. Respiratory: Normal respiratory effort, no increased work of  breathing. GI: Abdomen is soft, nontender, nondistended, no abdominal masses  Laboratory Data: Reviewed, see HPI  Pertinent Imaging: I have personally viewed and interpreted the CT from March 2023 showing small nonobstructive renal stones, no ureteral stones or hydronephrosis, brachytherapy seeds in the prostate.  Assessment & Plan:   66 year old male with history of prostate cancer treated with brachytherapy in 2015, PSA has remained essentially undetectable since that time.  I recommended continuing yearly PSA monitoring through 2025, likely can discontinue at that time per guideline recommendations.  Regarding his ED, I recommended a trial of sildenafil 100 mg on demand and risks and benefits were discussed.  In terms of his nephrolithiasis, no significant stone burden on most recent CT, no symptoms of renal colic or hematuria, recommend yearly KUB.  Regarding his sciatica, I recommended following up with his PCP or pain doctor to consider MRI or further treatment options.  Trial of sildenafil 100 mg on demand for ED Continue yearly PSA monitoring for history of prostate cancer RTC 1 year KUB prior for stone monitoring, PSA prior   Nickolas Madrid, MD 03/26/2022  [facility] [address] [place], [place] [address] [phone]

## 2022-03-26 NOTE — Telephone Encounter (Signed)
Requested medication (s) are due for refill today:   Yes  Requested medication (s) are on the active medication list:   Yes  Future visit scheduled:   No     Last ordered: 12/01/2021 #90, 0 refills  Returned because it looks like he has changed PCP.   LOV 07/02/2021.     Requested Prescriptions  Pending Prescriptions Disp Refills   metFORMIN (GLUCOPHAGE-XR) 500 MG 24 hr tablet [Pharmacy Med Name: METFORMIN HCL ER 500 MG TABLET] 90 tablet 0    Sig: TAKE 1 TABLET BY MOUTH DAILY WITH SUPPER.     Endocrinology:  Diabetes - Biguanides Failed - 03/26/2022  2:32 AM      Failed - Cr in normal range and within 360 days    Creat  Date Value Ref Range Status  07/02/2021 1.20 0.70 - 1.35 mg/dL Final   Creatinine, Ser  Date Value Ref Range Status  08/21/2021 1.35 (H) 0.61 - 1.24 mg/dL Final   Creatinine, Urine  Date Value Ref Range Status  07/02/2021 61 20 - 320 mg/dL Final         Failed - HBA1C is between 0 and 7.9 and within 180 days    Hemoglobin A1C  Date Value Ref Range Status  07/30/2020 7.8  Final   Hgb A1c MFr Bld  Date Value Ref Range Status  07/02/2021 8.4 (H) <5.7 % of total Hgb Final    Comment:    For someone without known diabetes, a hemoglobin A1c value of 6.5% or greater indicates that they may have  diabetes and this should be confirmed with a follow-up  test. . For someone with known diabetes, a value <7% indicates  that their diabetes is well controlled and a value  greater than or equal to 7% indicates suboptimal  control. A1c targets should be individualized based on  duration of diabetes, age, comorbid conditions, and  other considerations. . Currently, no consensus exists regarding use of hemoglobin A1c for diagnosis of diabetes for children. .          Failed - eGFR in normal range and within 360 days    EGFR (African American)  Date Value Ref Range Status  04/13/2013 >60  Final   GFR calc Af Amer  Date Value Ref Range Status  12/19/2016 >60  >60 mL/min Final    Comment:    (NOTE) The eGFR has been calculated using the CKD EPI equation. This calculation has not been validated in all clinical situations. eGFR's persistently <60 mL/min signify possible Chronic Kidney Disease.    EGFR (Non-African Amer.)  Date Value Ref Range Status  04/13/2013 >60  Final    Comment:    eGFR values <35m/min/1.73 m2 may be an indication of chronic kidney disease (CKD). Calculated eGFR is useful in patients with stable renal function. The eGFR calculation will not be reliable in acutely ill patients when serum creatinine is changing rapidly. It is not useful in  patients on dialysis. The eGFR calculation may not be applicable to patients at the low and high extremes of body sizes, pregnant women, and vegetarians.    GFR, Estimated  Date Value Ref Range Status  08/21/2021 59 (L) >60 mL/min Final    Comment:    (NOTE) Calculated using the CKD-EPI Creatinine Equation (2021)    eGFR  Date Value Ref Range Status  07/02/2021 68 > OR = 60 mL/min/1.726mFinal    Comment:    The eGFR is based on the CKD-EPI 2021 equation.  To calculate  the new eGFR from a previous Creatinine or Cystatin C result, go to https://www.kidney.org/professionals/ kdoqi/gfr%5Fcalculator          Failed - B12 Level in normal range and within 720 days    No results found for: "VITAMINB12"       Failed - Valid encounter within last 6 months    Recent Outpatient Visits           7 months ago RUQ abdominal pain   [facility] [place], Devonne Doughty, DO   8 months ago Type 2 diabetes mellitus with diabetic neuropathy, with long-term current use of insulin ([place])   [facility] Olin Hauser, DO   1 year ago [place], DO       Future Appointments             Today Billey Co, MD [facility] [place]            Passed - CBC  within normal limits and completed in the last 12 months    WBC  Date Value Ref Range Status  08/21/2021 7.4 4.0 - 10.5 K/uL Final   RBC  Date Value Ref Range Status  08/21/2021 5.55 4.22 - 5.81 MIL/uL Final   Hemoglobin  Date Value Ref Range Status  08/21/2021 17.4 (H) 13.0 - 17.0 g/dL Final   HGB  Date Value Ref Range Status  04/13/2013 16.0 13.0 - 18.0 g/dL Final   HCT  Date Value Ref Range Status  08/21/2021 50.7 39.0 - 52.0 % Final  04/13/2013 46.6 40.0 - 52.0 % Final   MCHC  Date Value Ref Range Status  08/21/2021 34.3 30.0 - 36.0 g/dL Final   [facility]  Date Value Ref Range Status  08/21/2021 31.4 26.0 - 34.0 pg Final   MCV  Date Value Ref Range Status  08/21/2021 91.4 80.0 - 100.0 fL Final  04/13/2013 95 80 - 100 fL Final   No results found for: "PLTCOUNTKUC", "LABPLAT", "POCPLA" RDW  Date Value Ref Range Status  08/21/2021 13.0 11.5 - 15.5 % Final  04/13/2013 12.7 11.5 - 14.5 % Final

## 2022-03-26 NOTE — Addendum Note (Signed)
Addended by: Amado Coe on: 03/26/2022 04:31 PM   Modules accepted: Orders

## 2022-03-26 NOTE — Patient Instructions (Signed)
Sciatica  Sciatica is pain, weakness, tingling, or loss of feeling (numbness) along the sciatic nerve. The sciatic nerve starts in the lower back and goes down the back of each leg. Sciatica usually affects one side of the body. Sciatica usually goes away on its own or with treatment. Sometimes, sciatica may come back. What are the causes? This condition happens when the sciatic nerve is pinched or has pressure put on it. This may be caused by: A disk in between the bones of the spine bulging out too far (herniated disk). Changes in the spinal disks due to aging. A condition that affects a muscle in the butt. Extra bone growth near the sciatic nerve. A break (fracture) of the area between your hip bones (pelvis). Pregnancy. Tumor. This is rare. What increases the risk? You are more likely to develop this condition if you: Play sports that put pressure or stress on the spine. Have poor strength and ease of movement (flexibility). Have had a back injury or back surgery. Sit for long periods of time. Do activities that involve bending or lifting over and over again. Are very overweight (obese). What are the signs or symptoms? Symptoms can vary from mild to very bad. They may include: Any of these problems in the lower back, leg, hip, or butt: Mild tingling, loss of feeling, or dull aches. A burning feeling. Sharp pains. Loss of feeling in the back of the calf or the sole of the foot. Leg weakness. Very bad back pain that makes it hard to move. These symptoms may get worse when you cough, sneeze, or laugh. They may also get worse when you sit or stand for long periods of time. How is this treated? This condition often gets better without any treatment. However, treatment may include: Changing or cutting back on physical activity when you have pain. Exercising, including strengthening and stretching. Putting ice or heat on the affected area. Shots of medicines to relieve pain and  swelling or to relax your muscles. Surgery. Follow these instructions at home: Medicines Take over-the-counter and prescription medicines only as told by your doctor. Ask your doctor if you should avoid driving or using machines while you are taking your medicine. Managing pain     If told, put ice on the affected area. To do this: Put ice in a plastic bag. Place a towel between your skin and the bag. Leave the ice on for 20 minutes, 2-3 times a day. If your skin turns bright red, take off the ice right away to prevent skin damage. The risk of skin damage is higher if you cannot feel pain, heat, or cold. If told, put heat on the affected area. Do this as often as told by your doctor. Use the heat source that your doctor tells you to use, such as a moist heat pack or a heating pad. Place a towel between your skin and the heat source. Leave the heat on for 20-30 minutes. If your skin turns bright red, take off the heat right away to prevent burns. The risk of burns is higher if you cannot feel pain, heat, or cold. Activity  Return to your normal activities when your doctor says that it is safe. Avoid activities that make your symptoms worse. Take short rests during the day. When you rest for a long time, do some physical activity or stretching between periods of rest. Avoid sitting for a long time without moving. Get up and move around at least one time each   hour. Do exercises and stretches as told by your doctor. Do not lift anything that is heavier than 10 lb (4.5 kg). Avoid lifting heavy things even when you do not have symptoms. Avoid lifting heavy things over and over. When you lift objects, always lift in a way that is safe for your body. To do this, you should: Bend your knees. Keep the object close to your body. Avoid twisting. General instructions Stay at a healthy weight. Wear comfortable shoes that support your feet. Avoid wearing high heels. Avoid sleeping on a mattress  that is too soft or too hard. You might have less pain if you sleep on a mattress that is firm enough to support your back. Contact a doctor if: Your pain is not controlled by medicine. Your pain does not get better. Your pain gets worse. Your pain lasts longer than 4 weeks. You lose weight without trying. Get help right away if: You cannot control when you pee (urinate) or poop (have a bowel movement). You have weakness in any of these areas and it gets worse: Lower back. The area between your hip bones. Butt. Legs. You have redness or swelling of your back. You have a burning feeling when you pee. Summary Sciatica is pain, weakness, tingling, or loss of feeling (numbness) along the sciatic nerve. This may include the lower back, legs, hips, and butt. This condition happens when the sciatic nerve is pinched or has pressure put on it. Treatment often includes rest, exercise, medicines, and putting ice or heat on the affected area. This information is not intended to replace advice given to you by your health care provider. Make sure you discuss any questions you have with your health care provider. Document Revised: 08/18/2021 Document Reviewed: 08/18/2021 Elsevier Patient Education  2023 Elsevier Inc.  

## 2022-04-07 NOTE — Progress Notes (Unsigned)
Called patient, no answer, left message to call our office so we could get information  on PPE.

## 2022-04-08 ENCOUNTER — Ambulatory Visit
Payer: 59 | Attending: Student in an Organized Health Care Education/Training Program | Admitting: Student in an Organized Health Care Education/Training Program

## 2022-04-08 DIAGNOSIS — M48061 Spinal stenosis, lumbar region without neurogenic claudication: Secondary | ICD-10-CM

## 2022-04-08 DIAGNOSIS — G8929 Other chronic pain: Secondary | ICD-10-CM

## 2022-04-08 DIAGNOSIS — M5416 Radiculopathy, lumbar region: Secondary | ICD-10-CM

## 2022-04-08 NOTE — Progress Notes (Signed)
No PPE done

## 2022-04-10 ENCOUNTER — Telehealth: Payer: Self-pay | Admitting: Student in an Organized Health Care Education/Training Program

## 2022-04-10 NOTE — Telephone Encounter (Signed)
PT friend stated that patient is still having pain in left knee. Pt is having issues with walking that it's causing patient to fall. PT wants to know what is the next step that can be taken. Please give patient a call. Thanks

## 2022-04-10 NOTE — Telephone Encounter (Signed)
Patient did not receive his Post procedure follow up call from Dr Holley Raring because he didn't answer the phone when we tried to call him before t he appointment.  Please reschedule his PPE phone call and remind him that we have to talk to him the day before so that we can gather the information that Dr Holley Raring needs.  If he does not speak with [place], Dr Holley Raring will not call him.  Thaks

## 2022-05-27 ENCOUNTER — Ambulatory Visit
Payer: 59 | Attending: Student in an Organized Health Care Education/Training Program | Admitting: Student in an Organized Health Care Education/Training Program

## 2022-05-27 ENCOUNTER — Encounter: Payer: Self-pay | Admitting: Student in an Organized Health Care Education/Training Program

## 2022-05-27 VITALS — BP 156/73 | HR 96 | Temp 97.2°F | Resp 20 | Ht 66.0 in | Wt 170.0 lb

## 2022-05-27 DIAGNOSIS — M47816 Spondylosis without myelopathy or radiculopathy, lumbar region: Secondary | ICD-10-CM | POA: Insufficient documentation

## 2022-05-27 DIAGNOSIS — M5416 Radiculopathy, lumbar region: Secondary | ICD-10-CM | POA: Diagnosis present

## 2022-05-27 DIAGNOSIS — G894 Chronic pain syndrome: Secondary | ICD-10-CM | POA: Diagnosis present

## 2022-05-27 DIAGNOSIS — M48061 Spinal stenosis, lumbar region without neurogenic claudication: Secondary | ICD-10-CM | POA: Insufficient documentation

## 2022-05-27 DIAGNOSIS — G8929 Other chronic pain: Secondary | ICD-10-CM | POA: Insufficient documentation

## 2022-05-27 NOTE — Progress Notes (Signed)
Safety precautions to be maintained throughout the outpatient stay will include: orient to surroundings, keep bed in low position, maintain call bell within reach at all times, provide assistance with transfer out of bed and ambulation.  

## 2022-05-27 NOTE — Patient Instructions (Signed)

## 2022-05-27 NOTE — Progress Notes (Signed)
PROVIDER NOTE: Information contained herein reflects review and annotations entered in association with encounter. Interpretation of such information and data should be left to medically-trained personnel. Information provided to patient can be located elsewhere in the medical record under "Patient Instructions". Document created using STT-dictation technology, any transcriptional errors that may result from process are unintentional.    Patient: Devin Mayer  Service Category: E/M  Provider: Gillis Santa, MD  DOB: 01-10-1956  DOS: 05/27/2022  Referring Provider: Gladstone Lighter, MD  MRN: [id number]  Specialty: Interventional Pain Management  PCP: Gladstone Lighter, MD  Type: Established Patient  Setting: Ambulatory outpatient    Location: Office  Delivery: Face-to-face     HPI  [name], a 67 y.o. year old male, is here today because of his Chronic radicular lumbar pain [M54.16, G89.29]. [name] primary complain today is Back Pain Last encounter: My last encounter with him was on 04/10/2022. Pertinent problems: 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 Pain Assessment: Severity of Chronic pain is reported as a 9 /10. Location: Back Left, Lower/around to pelvis down down to knee down front of leg to foot. Onset: More than a month ago. Quality: Aching, Discomfort, Sore, Radiating. Timing: Constant. Modifying factor(s): rest "Nothing". Vitals:  height is _0  (1.676 m) and weight is 170 lb (77.1 kg). His temperature is 97.2 F (36.2 C) (abnormal). His blood pressure is 156/73 (abnormal) and his pulse is 96. His respiration is 20 and oxygen saturation is 97%.  BMI: Estimated body mass index is 27.44 kg/m as calculated from the  following:   Height as of this encounter: _1  (1.676 m).   Weight as of this encounter: 170 lb (77.1 kg).  Reason for encounter: post-procedure evaluation and assessment.   Post-procedure evaluation   Type: Lumbar epidural steroid injection (LESI) (interlaminar) #2    Laterality: Left   Level:  L4-5 Level.  Imaging: Fluoroscopic guidance         Anesthesia: Local anesthesia (1-2% Lidocaine) Anxiolysis: Oral Valium 5 mg DOS: 03/11/2022  Performed by: Gillis Santa, MD  Purpose: Diagnostic/Therapeutic Indications: Lumbar radicular pain of intraspinal etiology of more than 4 weeks that has failed to respond to conservative therapy and is severe enough to impact quality of life or function. 1. Chronic radicular lumbar pain   2. 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 Chronic pain syndrome    NAS-11 Pain score:   Pre-procedure: 9 /10   Post-procedure: 5 /10      Effectiveness:  Initial hour after procedure: 90 %  Subsequent 4-6 hours post-procedure: 90 %  Analgesia past initial 6 hours: 0 % (1 day)  Ongoing improvement:  Analgesic:  0%     ROS  Constitutional: Denies any fever or chills Gastrointestinal: No reported hemesis, hematochezia, vomiting, or acute GI distress Musculoskeletal:  Low back pain with radiation into left lower extremity Neurological: No reported episodes of acute onset apraxia, aphasia, dysarthria, agnosia, amnesia, paralysis, loss of coordination, or loss of consciousness  Medication Review  Aspirin-Caffeine, albuterol, clonazePAM, diclofenac, famotidine, hydrochlorothiazide, insulin glargine, metFORMIN, nortriptyline, pantoprazole, potassium chloride SA, pravastatin, pregabalin, sildenafil, sitaGLIPtin, and triamcinolone cream  History Review  Allergy: [name] is allergic to latex. Drug: [name]  reports no history of drug use. Alcohol:  reports current alcohol use. Tobacco:  reports that he has been smoking cigars. He  has never used smokeless tobacco. Social: [name]  reports that he has been smoking cigars. He has never used smokeless tobacco. He reports current alcohol use. He reports that he does not use drugs. Medical:  has a past medical history of Cancer ([employer]), COPD (chronic obstructive pulmonary disease) ([place]), Diabetes mellitus without complication ([place]), Hyperlipidemia, Hypertension, and Stroke ([place]). Surgical: [name]  has a past surgical history that includes Shoulder surgery (Bilateral) and Cholecystectomy. Family: family history includes Diabetes in his father; Heart attack in his father and paternal uncle; Lung cancer in his maternal grandmother and paternal grandmother.  Laboratory Chemistry Profile   Renal Lab Results  Component Value Date   BUN 23 08/21/2021   CREATININE 1.35 (H) 08/21/2021   LABCREA 61 77/93/9030   BCR NOT APPLICABLE 02/14/3006   GFRAA >60 12/19/2016   GFRNONAA 59 (L) 08/21/2021    Hepatic Lab Results  Component Value Date   AST 37 08/21/2021   ALT 58 (H) 08/21/2021   ALBUMIN 4.2 08/21/2021   ALKPHOS 82 08/21/2021   LIPASE 48 08/21/2021    Electrolytes Lab Results  Component Value Date   NA 132 (L) 08/21/2021   K 4.8 08/21/2021   CL 96 (L) 08/21/2021   CALCIUM 9.3 08/21/2021    Bone No results found for: "VD25OH", "VD125OH2TOT", "MA2633HL4", "TG2563SL3", "25OHVITD1", "25OHVITD2", "25OHVITD3", "TESTOFREE", "TESTOSTERONE"  Inflammation (CRP: Acute Phase) (ESR: Chronic Phase) Lab Results  Component Value Date   CRP 2.3 (H) 07/10/2020   ESRSEDRATE 7 07/10/2020         Note: Above Lab results reviewed.  Physical Exam  General appearance: Well nourished, well developed, and well hydrated. In no apparent acute distress Mental status: Alert, oriented x 3 (person, place, & time)       Respiratory: No evidence of acute respiratory distress Eyes: PERLA Vitals: BP (!) 156/73   Pulse 96   Temp (!) 97.2 F (36.2 C)   Resp 20   Ht _0  (1.676 m)    Wt 170 lb (77.1 kg)   SpO2 97%   BMI 27.44 kg/m  BMI: Estimated body mass index is 27.44 kg/m as calculated from the following:   Height as of this encounter: _1  (1.676 m).   Weight as of this encounter: 170 lb (77.1 kg). Ideal: Ideal body weight: 63.8 kg (140 lb 10.5 oz) Adjusted ideal body weight: 69.1 kg (152 lb 6.3 oz)  Assessment   Diagnosis Status  1. Chronic radicular lumbar pain   2. 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 Lumbar facet joint syndrome   4. Lumbar facet arthropathy   5. Chronic pain syndrome    Persistent Persistent Persistent   Updated Problems: No problems updated.  Plan of Care  Patient continues to endorse left low back and radiating leg pain not responsive to neuropathic's, muscle relaxers, NSAIDs, physical therapy, lumbar epidural steroid injections.  At this point, I have limited options for the patient and recommend evaluation with neurosurgery for lumbar decompression at L3-L4.   Orders:  Orders Placed This Encounter  Procedures   Ambulatory referral to Neurosurgery    Referral Priority:   Routine    Referral Type:   Surgical    Referral Reason:   Specialty Services Required    Referred to Provider:   Earnie Larsson, MD    Requested Specialty:   Neurosurgery    Number  of Visits Requested:   1   Follow-up plan:   Return for no follow ups needed.     L5/S1 ESI 03/17/21, TF RIGHT L3 ESI 05/12/21, 06/04/21; Left L3,4,5, MBNB 12/17/21, Left L4/5 ESI 01/21/22, 03/11/22        Recent Visits Date Type Provider Dept  04/08/22 Office Visit Gillis Santa, MD [facility]  03/11/22 Procedure visit Gillis Santa, MD [facility]  Showing recent visits within past 90 days and meeting all other requirements Today's Visits Date Type Provider Dept  05/27/22 Office Visit Gillis Santa, MD [facility]  Showing today's visits and meeting all other requirements Future Appointments No visits were found  meeting these conditions. Showing future appointments within next 90 days and meeting all other requirements  I discussed the assessment and treatment plan with the patient. The patient was provided an opportunity to ask questions and all were answered. The patient agreed with the plan and demonstrated an understanding of the instructions.  Patient advised to call back or seek an in-person evaluation if the symptoms or condition worsens.  Duration of encounter: 36mnutes.  Total time on encounter, as per AMA guidelines included both the face-to-face and non-face-to-face time personally spent by the physician and/or other qualified health care professional(s) on the day of the encounter (includes time in activities that require the physician or other qualified health care professional and does not include time in activities normally performed by clinical staff). Physician's time may include the following activities when performed: Preparing to see the patient (e.g., pre-charting review of records, searching for previously ordered imaging, lab work, and nerve conduction tests) Review of prior analgesic pharmacotherapies. Reviewing PMP Interpreting ordered tests (e.g., lab work, imaging, nerve conduction tests) Performing post-procedure evaluations, including interpretation of diagnostic procedures Obtaining and/or reviewing separately obtained history Performing a medically appropriate examination and/or evaluation Counseling and educating the patient/family/caregiver Ordering medications, tests, or procedures Referring and communicating with other health care professionals (when not separately reported) Documenting clinical information in the electronic or other health record Independently interpreting results (not separately reported) and communicating results to the patient/ family/caregiver Care coordination (not separately reported)  Note by: BGillis Santa MD Date: 05/27/2022; Time: 1:40  PM

## 2022-06-06 IMAGING — MR MR LUMBAR SPINE W/O CM
5 series · 31 of 48 positions shown · non-contrast
Comparison: CT abdomen pelvis 04/13/2013.

CLINICAL DATA: Osteoarthritis, lumbosacral; lumbar radiculopathy.
Central, left low back and hip pain.

EXAM:
MRI LUMBAR SPINE WITHOUT CONTRAST
TECHNIQUE: Multiplanar, multisequence MR imaging of the lumbar spine was
performed. No intravenous contrast was administered.

[Series 5: T2 · sagittal · 4.0mm · 0.81mm/px · 6 of 17 slices shown (1 of 2)]
[im 1/17]
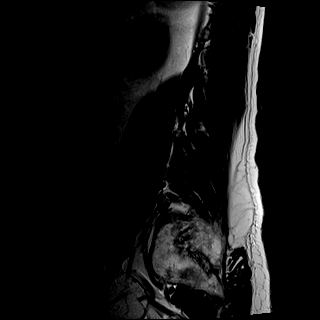
[im 4/17]
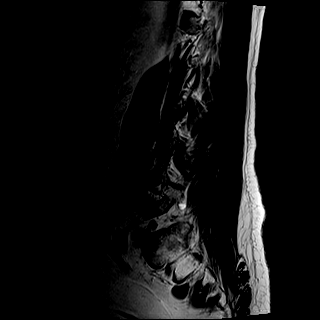
[im 7/17]
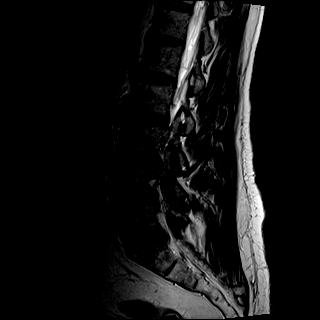
[im 10/17]
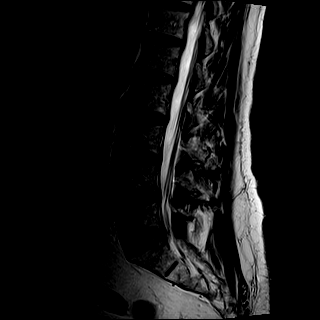
[im 13/17]
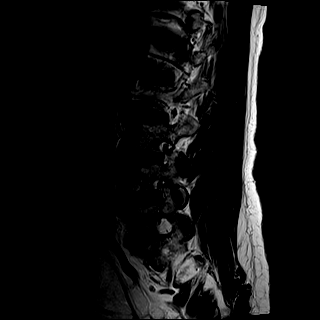
[im 17/17]
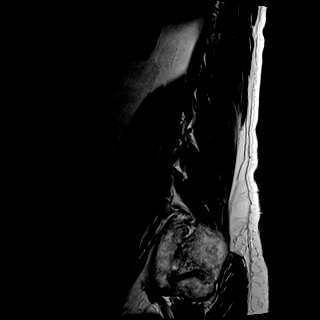

[Series 6: T1 · sagittal · 4.0mm · 0.81mm/px · 7 of 17 slices shown (1 of 2)]
[im 1/17]
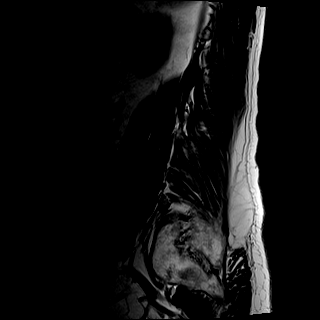
[im 3/17]
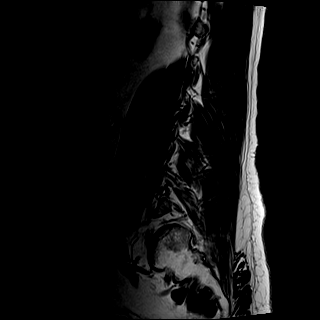
[im 6/17]
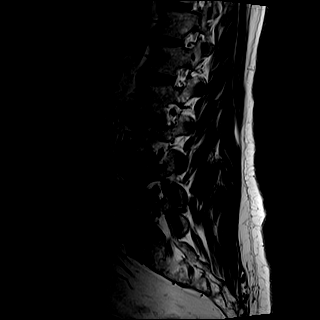
[im 9/17]
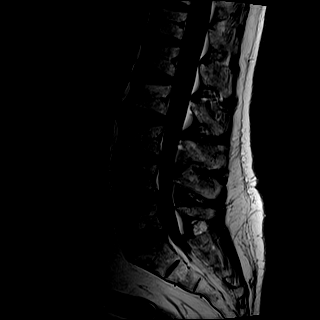
[im 11/17]
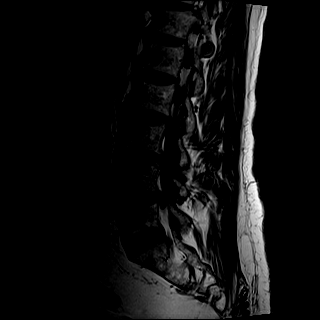
[im 14/17]
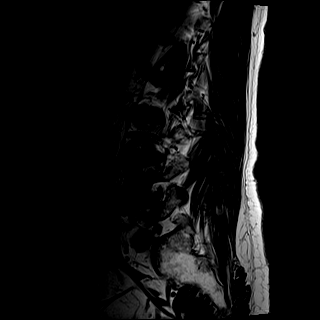
[im 17/17]
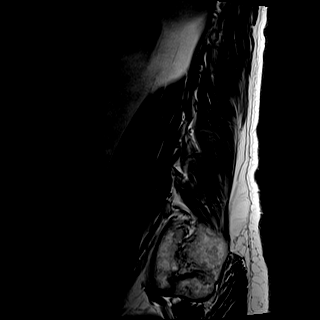

[Series 7: STIR · sagittal · 4.0mm · 0.41mm/px · 2 of 17 slices shown]
[im 1/17]
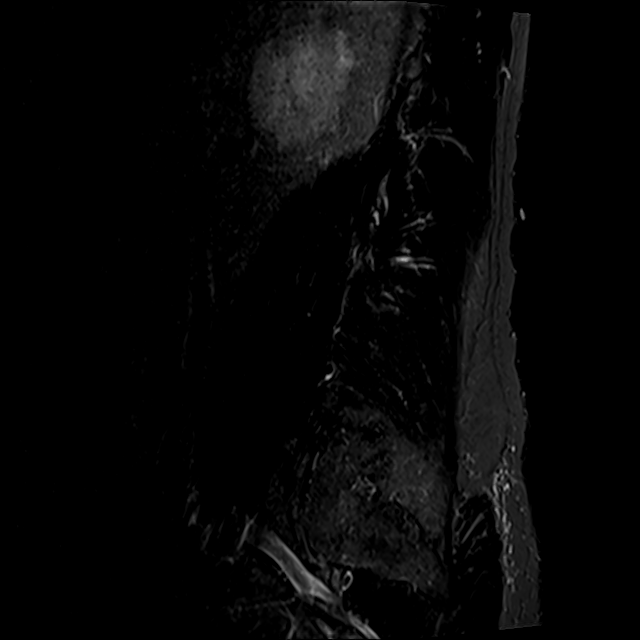
[im 3/17]
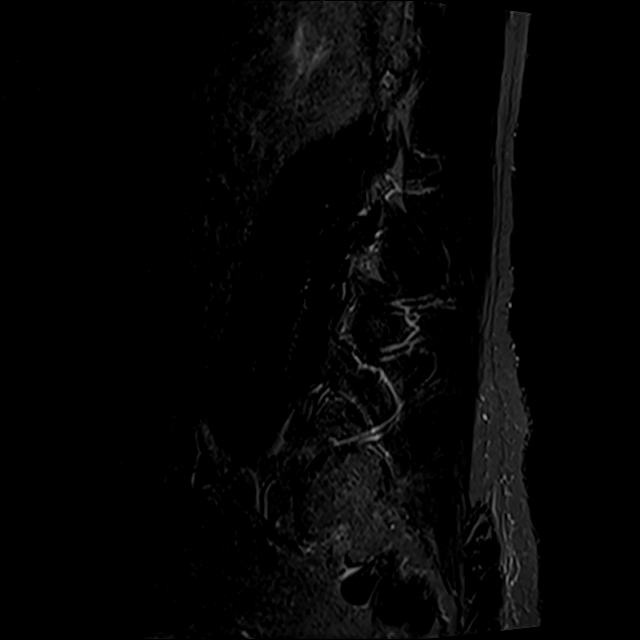

[Series 8: T2 · axial · 4.0mm · 0.78mm/px · z∈[-137,+81]mm · 8 of 36 slices shown (2 of 2)]
[im 1/36]
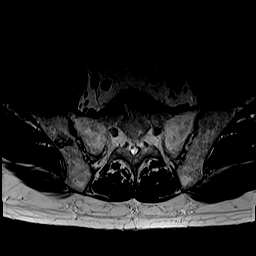
[im 6/36]
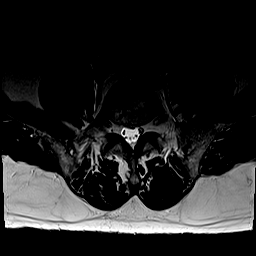
[im 11/36]
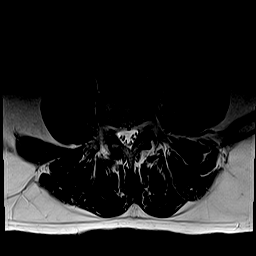
[im 17/36]
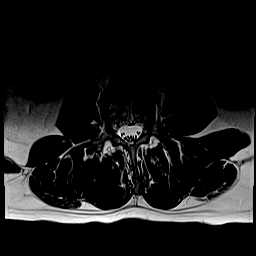
[im 19/36]
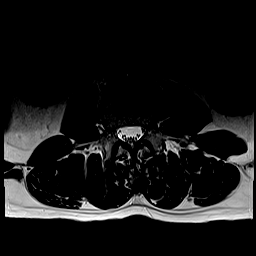
[im 25/36]
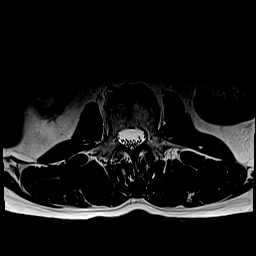
[im 30/36]
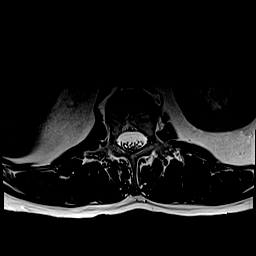
[im 36/36]
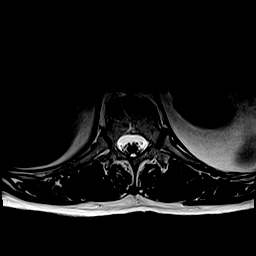

[Series 9: T1 · axial · 4.0mm · 0.39mm/px · z∈[-137,+81]mm · 8 of 36 slices shown (2 of 2)]
[im 1/36]
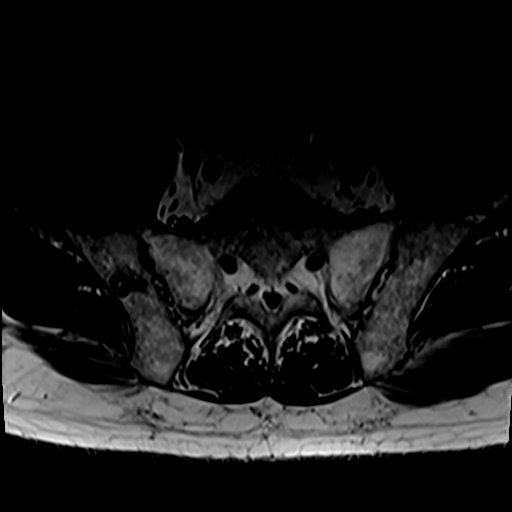
[im 6/36]
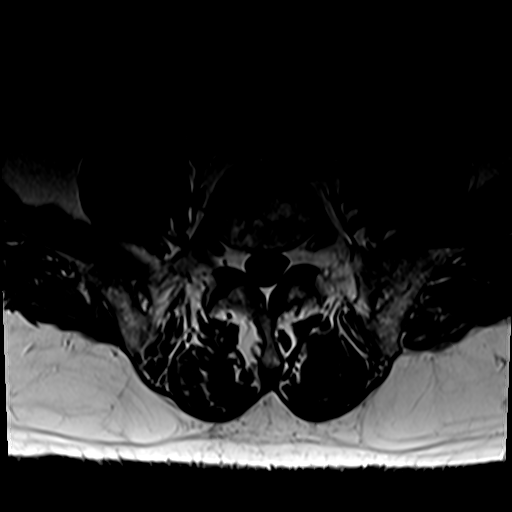
[im 11/36]
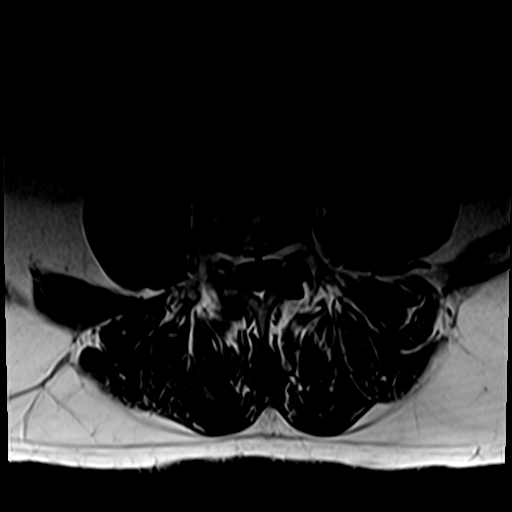
[im 17/36]
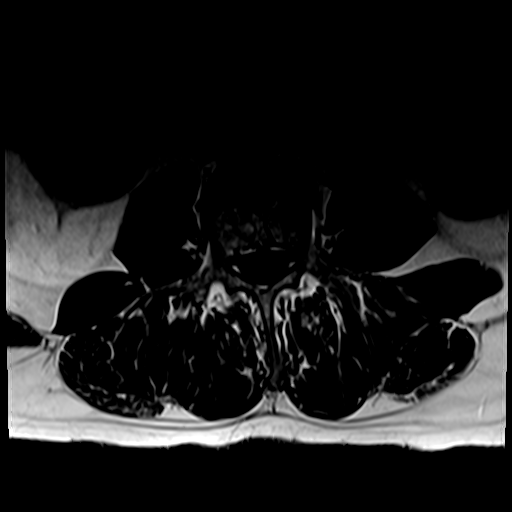
[im 19/36]
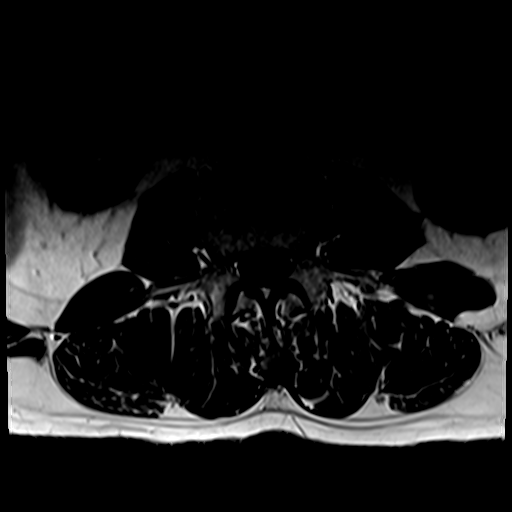
[im 25/36]
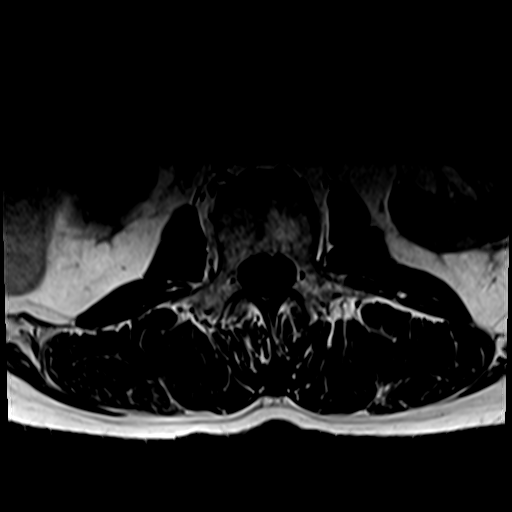
[im 30/36]
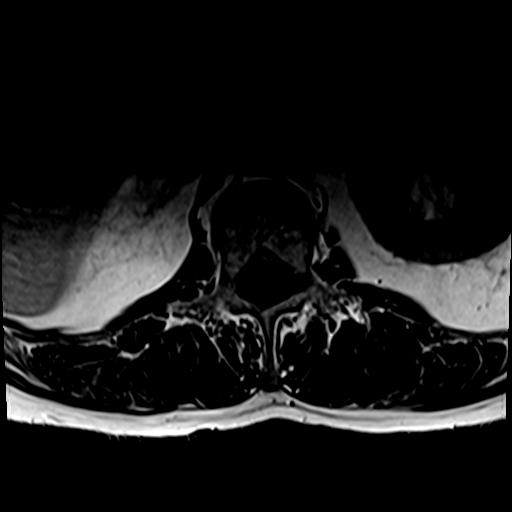
[im 36/36]
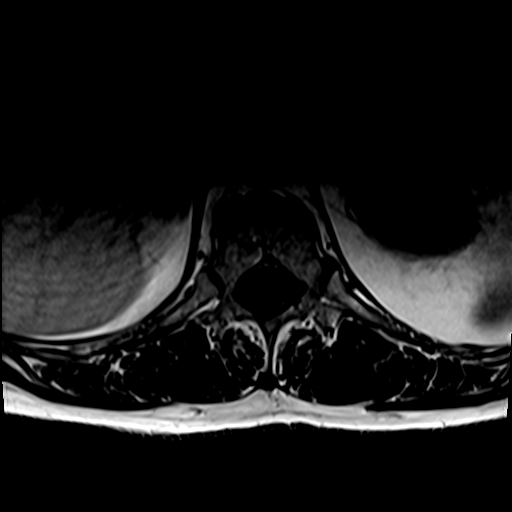

[31 of 48 positions shown; findings below may reference images not displayed]

FINDINGS: Segmentation: Standard.

Alignment: Physiologic.

Vertebrae: No fracture, evidence of discitis, or bone lesion.

Conus medullaris and cauda equina: Conus extends to the T12-L1
level. Conus and cauda equina appear normal.

Paraspinal and other soft tissues: Stable right adrenal adenoma.
Partially visualized cystic structures within the right kidney. No
acute findings.

Disc levels:

T12-L1: Unremarkable.

L1-L2: Unremarkable.

L2-L3: Mild disc desiccation with minimal annular disc bulge. No
foraminal or canal stenosis.

L3-L4: Mild disc desiccation with minimal annular disc bulge and
shallow right extraforaminal disc protrusion with annular fissure.
Slight mass effect upon the exited right L3 nerve root. Slight
bilateral foraminal narrowing, left slightly worse than right. No
canal stenosis.

L4-L5: Mild diffuse disc bulge with right foraminal protrusion with
annular fissure. Mild bilateral facet hypertrophy. Mild right
foraminal stenosis. No canal stenosis.

L5-S1: Unremarkable.
IMPRESSION: 1. Mild lumbar spondylosis as described above.
2. At L3-L4, a shallow right extraforaminal disc protrusion with
annular fissure results in slight mass effect upon the exited right
L3 nerve root. Slight bilateral foraminal narrowing.
3. At L4-L5, a right foraminal disc protrusion with annular fissure
contributes to mild right foraminal stenosis.
4. No canal stenosis at any level.

## 2022-07-11 ENCOUNTER — Other Ambulatory Visit: Payer: Self-pay | Admitting: Family Medicine

## 2022-07-11 DIAGNOSIS — E1169 Type 2 diabetes mellitus with other specified complication: Secondary | ICD-10-CM

## 2022-07-11 DIAGNOSIS — K219 Gastro-esophageal reflux disease without esophagitis: Secondary | ICD-10-CM

## 2022-07-13 NOTE — Telephone Encounter (Signed)
Requested medications are due for refill today.  yes  Requested medications are on the active medications list.  yes  Last refill. 07/02/2021 #90 3 rf for both  Future visit scheduled.   no  Notes to clinic.  Gladstone Lighter listed as PCP>    Requested Prescriptions  Pending Prescriptions Disp Refills   pantoprazole (PROTONIX) 40 MG tablet [Pharmacy Med Name: PANTOPRAZOLE SOD DR 40 MG TAB] 90 tablet 3    Sig: TAKE 1 TABLET BY MOUTH DAILY BEFORE BREAKFAST     Gastroenterology: Proton Pump Inhibitors Passed - 07/11/2022 10:04 AM      Passed - Valid encounter within last 12 months    Recent Outpatient Visits           10 months ago RUQ abdominal pain   [place], DO   1 year ago Type 2 diabetes mellitus with diabetic neuropathy, with long-term current use of insulin ([place])   [place], DO   1 year ago [place], DO       Future Appointments             In 8 months Diamantina [place], Herbert Seta, MD [facility] [place]             pravastatin (PRAVACHOL) 40 MG tablet [Pharmacy Med Name: PRAVASTATIN SODIUM 40 MG TAB] 90 tablet 3    Sig: TAKE 1 TABLET BY MOUTH EVERY DAY     Cardiovascular:  Antilipid - Statins Failed - 07/11/2022 10:04 AM      Failed - Lipid Panel in normal range within the last 12 months    No results found for: "CHOL", "POCCHOL", "CHOLTOT" No results found for: "[place]", "LDLC", "HIRISKLDL", "POCLDL", "LDLDIRECT", "REALLDLC", "TOTLDLC" No results found for: "HDL", "POCHDL" No results found for: "TRIG", "POCTRIG"       Passed - Patient is not pregnant      Passed - Valid encounter within last 12 months    Recent Outpatient Visits           10 months ago RUQ abdominal pain   [facility] [place], Devonne Doughty, DO   1 year ago Type 2  diabetes mellitus with diabetic neuropathy, with long-term current use of insulin [facility])   [facility] [place], Devonne Doughty, DO   1 year ago [place], DO       Future Appointments             In 8 months Diamantina [place], Herbert Seta, MD [place]

## 2022-07-17 ENCOUNTER — Encounter (INDEPENDENT_AMBULATORY_CARE_PROVIDER_SITE_OTHER): Payer: Self-pay | Admitting: Vascular Surgery

## 2022-07-17 ENCOUNTER — Ambulatory Visit (INDEPENDENT_AMBULATORY_CARE_PROVIDER_SITE_OTHER): Payer: 59 | Admitting: Vascular Surgery

## 2022-07-17 VITALS — BP 137/83 | HR 90 | Resp 18 | Ht 66.0 in | Wt 168.4 lb

## 2022-07-17 DIAGNOSIS — I70229 Atherosclerosis of native arteries of extremities with rest pain, unspecified extremity: Secondary | ICD-10-CM | POA: Insufficient documentation

## 2022-07-17 DIAGNOSIS — I6523 Occlusion and stenosis of bilateral carotid arteries: Secondary | ICD-10-CM | POA: Diagnosis not present

## 2022-07-17 DIAGNOSIS — E1140 Type 2 diabetes mellitus with diabetic neuropathy, unspecified: Secondary | ICD-10-CM

## 2022-07-17 DIAGNOSIS — I70222 Atherosclerosis of native arteries of extremities with rest pain, left leg: Secondary | ICD-10-CM

## 2022-07-17 DIAGNOSIS — Z794 Long term (current) use of insulin: Secondary | ICD-10-CM

## 2022-07-17 DIAGNOSIS — I10 Essential (primary) hypertension: Secondary | ICD-10-CM

## 2022-07-17 DIAGNOSIS — F17200 Nicotine dependence, unspecified, uncomplicated: Secondary | ICD-10-CM

## 2022-07-17 DIAGNOSIS — IMO0001 Reserved for inherently not codable concepts without codable children: Secondary | ICD-10-CM

## 2022-07-17 NOTE — Assessment & Plan Note (Signed)
blood glucose control important in reducing the progression of atherosclerotic disease. Also, involved in wound healing. On appropriate medications.  

## 2022-07-17 NOTE — H&P (View-Only) (Signed)
  Patient ID: Devin Mayer, male   DOB: 07/24/1955, 67 y.o.   MRN: [id number]  Chief Complaint  Patient presents with  . Follow-up    LS 3.4.22 fb/jd.MRI showing significant plaque and near total occlusion of his left iliac artery. referred by pool, [place].      HPI Devin Mayer is a 67 y.o. male.  I am asked to see the patient by Dr. Poole for evaluation of left iliac artery stenosis seen on a recent MRI scan with disabling left lower extremity claudication symptoms and symptoms concerning for rest pain.  Patient has chronic numbness and pain in his left leg.  He describes any distance of walking being a miserable experience with his leg giving out and hurting.  He has been treated previously for sciatica and other neurogenic issues.  He has a long history of smoking and multiple other atherosclerotic risk factors as listed below.  I do not have the images of the MRI for review, but by report there is a near occlusive stenosis of the left iliac artery.  The symptoms began in the hip and buttock area and radiate down the leg consistent with iliac level disease.  We have previously seen him for carotid disease that was mild to mild to moderate in nature.  He was last seen about 2 years ago.  No focal neurologic symptoms recently but he does have a remote history of stroke.  This has not been checked in at least a year by their report   Past Medical History:  Diagnosis Date  . Cancer (HCC)   . COPD (chronic obstructive pulmonary disease) (HCC)   . Diabetes mellitus without complication (HCC)   . Hyperlipidemia   . Hypertension   . Stroke (HCC)     Past Surgical History:  Procedure Laterality Date  . CHOLECYSTECTOMY    . SHOULDER SURGERY Bilateral      Family History  Problem Relation Age of Onset  . Heart attack Father   . Diabetes Father   . Heart attack Paternal Uncle   . Lung cancer Maternal Grandmother   . Lung cancer Paternal Grandmother       Social History    Tobacco Use  . Smoking status: Every Day    Types: Cigars  . Smokeless tobacco: Never  Substance Use Topics  . Alcohol use: Yes  . Drug use: Never     Allergies  Allergen Reactions  . Latex Rash    Current Outpatient Medications  Medication Sig Dispense Refill  . albuterol (VENTOLIN HFA) 108 (90 Base) MCG/ACT inhaler Inhale 2 puffs into the lungs every 6 (six) hours as needed.    . Aspirin-Caffeine (BAYER BACK & BODY) 500-32.5 MG TABS Take by mouth.    . clonazePAM (KLONOPIN) 0.5 MG tablet Take 1 tablet (0.5 mg total) by mouth at bedtime as needed. 10 tablet 5  . diclofenac (VOLTAREN) 50 MG EC tablet Take 50 mg by mouth 3 (three) times daily.    . famotidine (PEPCID) 20 MG tablet TAKE 1 TABLET (20 MG TOTAL) BY MOUTH TWO (2) TIMES A DAY.    . gabapentin (NEURONTIN) 600 MG tablet Take 600 mg by mouth 3 (three) times daily.    . hydrochlorothiazide (HYDRODIURIL) 25 MG tablet Take 25 mg by mouth daily.    . insulin glargine (LANTUS SOLOSTAR) 100 UNIT/ML Solostar Pen Inject 12 Units into the skin daily. 15 mL 5  . JANUVIA 50 MG tablet Take 1 tablet (50 mg total) by   mouth daily. 30 tablet 5  . metFORMIN (GLUCOPHAGE-XR) 500 MG 24 hr tablet TAKE 1 TABLET BY MOUTH DAILY WITH SUPPER. 90 tablet 0  . nortriptyline (PAMELOR) 10 MG capsule Take 20 mg by mouth at bedtime.    . pantoprazole (PROTONIX) 40 MG tablet Take 1 tablet (40 mg total) by mouth daily before breakfast. 90 tablet 3  . potassium chloride SA (KLOR-CON M) 20 MEQ tablet Take 1 tablet (20 mEq total) by mouth daily. 90 tablet 3  . pravastatin (PRAVACHOL) 40 MG tablet Take 1 tablet (40 mg total) by mouth daily. 90 tablet 3  . pregabalin (LYRICA) 50 MG capsule Take 1 capsule (50 mg total) by mouth 3 (three) times daily. 90 capsule 0  . sildenafil (VIAGRA) 100 MG tablet Take 1 tablet (100 mg total) by mouth daily as needed for erectile dysfunction. 30 tablet 3  . triamcinolone cream (KENALOG) 0.5 % Apply 1 application topically 2  (two) times daily. To affected areas, for up to 2 weeks. 30 g 2   No current facility-administered medications for this visit.      REVIEW OF SYSTEMS (Negative unless checked)  Constitutional: []Weight loss  []Fever  []Chills Cardiac: []Chest pain   []Chest pressure   []Palpitations   []Shortness of breath when laying flat   []Shortness of breath at rest   [x]Shortness of breath with exertion. Vascular:  [x]Pain in legs with walking   [x]Pain in legs at rest   []Pain in legs when laying flat   []Claudication   []Pain in feet when walking  []Pain in feet at rest  []Pain in feet when laying flat   []History of DVT   []Phlebitis   []Swelling in legs   []Varicose veins   []Non-healing ulcers Pulmonary:   []Uses home oxygen   []Productive cough   []Hemoptysis   []Wheeze  [x]COPD   []Asthma Neurologic:  []Dizziness  []Blackouts   []Seizures   []History of stroke   []History of TIA  []Aphasia   []Temporary blindness   []Dysphagia   []Weakness or numbness in arms   [x]Weakness or numbness in legs Musculoskeletal:  [x]Arthritis   []Joint swelling   [x]Joint pain   []Low back pain Hematologic:  []Easy bruising  []Easy bleeding   []Hypercoagulable state   []Anemic  []Hepatitis Gastrointestinal:  []Blood in stool   []Vomiting blood  []Gastroesophageal reflux/heartburn   []Abdominal pain Genitourinary:  []Chronic kidney disease   []Difficult urination  []Frequent urination  []Burning with urination   []Hematuria Skin:  []Rashes   []Ulcers   []Wounds Psychological:  []History of anxiety   [] History of major depression.    Physical Exam BP 137/83 (BP Location: Right Arm)   Pulse 90   Resp 18   Ht 5' 6" (1.676 m)   Wt 168 lb 6.4 oz (76.4 kg)   BMI 27.18 kg/m  Gen:  WD/WN, NAD. Appears older than stated age. Strong tobacco odor present. Head: [place]/AT, No temporalis wasting.  Ear/Nose/Throat: Hearing grossly intact, nares w/o erythema or drainage, oropharynx w/o Erythema/Exudate Eyes: Conjunctiva  clear, sclera non-icteric  Neck: trachea midline.  No JVD.  Pulmonary:  Good air movement, respirations not labored, no use of accessory muscles  Cardiac: RRR, no JVD Vascular:  Vessel Right Left  Radial Palpable Palpable                          DP 2+ NP  PT 1+ trace   Gastrointestinal:.   No masses, surgical incisions, or scars. Musculoskeletal: M/S 5/5 throughout.  Extremities without ischemic changes.  No deformity or atrophy. Trace LE edema. Neurologic: Sensation grossly intact in extremities.  Symmetrical.  Speech is fluent. Motor exam as listed above. Psychiatric: Judgment intact, Mood & affect appropriate for pt's clinical situation. Dermatologic: No rashes or ulcers noted.  No cellulitis or open wounds.    Radiology No results found.  Labs No results found for this or any previous visit (from the past 2160 hour(s)).  Assessment/Plan:  Bilateral carotid artery stenosis Has not been checked in over a year.  His lower extremity symptoms will take precedence initially, but this can be checked on follow-up visits in the future.  Hypertension blood pressure control important in reducing the progression of atherosclerotic disease. On appropriate oral medications.   Type 2 diabetes mellitus with diabetic neuropathy, with long-term current use of insulin (HCC) blood glucose control important in reducing the progression of atherosclerotic disease. Also, involved in wound healing. On appropriate medications.   Smoking Presents and the atherosclerotic risk factor for his multiple atherosclerotic vascular issues.  Would benefit from smoking cessation.  Atherosclerosis of native arteries of extremity with rest pain (HCC) I do not have the images of the MRI for review, but by report there is a near occlusive stenosis of the left iliac artery.  The symptoms began in the hip and buttock area and radiate down the leg consistent with iliac level disease.    Recommend:  The  patient has evidence of severe atherosclerotic changes of both lower extremities with rest pain that is associated with preulcerative changes and impending tissue loss of the left foot.  This represents a limb threatening ischemia and places the patient at the risk for left limb loss.  Patient should undergo angiography of the left lower extremity with the hope for intervention for limb salvage.  The risks and benefits as well as the alternative therapies was discussed in detail with the patient.  All questions were answered.  Patient agrees to proceed with left lower extremity angiography.  The patient will follow up with me in the office after the procedure.          Oberia Beaudoin 07/17/2022, 10:36 AM   This note was created with Dragon medical transcription system.  Any errors from dictation are unintentional.   

## 2022-07-17 NOTE — Progress Notes (Signed)
Patient ID: Devin Mayer, male   DOB: 17-Jul-1955, 68 y.o.   MRN: [id number]  Chief Complaint  Patient presents with  . Follow-up    LS 3.4.22 fb/jd.MRI showing significant plaque and near total occlusion of his left iliac artery. referred by pool, [place].      HPI Devin Mayer is a 67 y.o. male.  I am asked to see the patient by Dr. Trenton Gammon for evaluation of left iliac artery stenosis seen on a recent MRI scan with disabling left lower extremity claudication symptoms and symptoms concerning for rest pain.  Patient has chronic numbness and pain in his left leg.  He describes any distance of walking being a miserable experience with his leg giving out and hurting.  He has been treated previously for sciatica and other neurogenic issues.  He has a long history of smoking and multiple other atherosclerotic risk factors as listed below.  I do not have the images of the MRI for review, but by report there is a near occlusive stenosis of the left iliac artery.  The symptoms began in the hip and buttock area and radiate down the leg consistent with iliac level disease.  We have previously seen him for carotid disease that was mild to mild to moderate in nature.  He was last seen about 2 years ago.  No focal neurologic symptoms recently but he does have a remote history of stroke.  This has not been checked in at least a year by their report   Past Medical History:  Diagnosis Date  . Cancer ([place])   . COPD (chronic obstructive pulmonary disease) ([place])   . Diabetes mellitus without complication ([place])   . Hyperlipidemia   . Hypertension   . Stroke [facility])     Past Surgical History:  Procedure Laterality Date  . CHOLECYSTECTOMY    . SHOULDER SURGERY Bilateral      Family History  Problem Relation Age of Onset  . Heart attack Father   . Diabetes Father   . Heart attack Paternal Uncle   . Lung cancer Maternal Grandmother   . Lung cancer Paternal Grandmother       Social History    Tobacco Use  . Smoking status: Every Day    Types: Cigars  . Smokeless tobacco: Never  Substance Use Topics  . Alcohol use: Yes  . Drug use: Never     Allergies  Allergen Reactions  . Latex Rash    Current Outpatient Medications  Medication Sig Dispense Refill  . albuterol (VENTOLIN HFA) 108 (90 Base) MCG/ACT inhaler Inhale 2 puffs into the lungs every 6 (six) hours as needed.    . Aspirin-Caffeine (BAYER BACK & BODY) 500-32.5 MG TABS Take by mouth.    . clonazePAM (KLONOPIN) 0.5 MG tablet Take 1 tablet (0.5 mg total) by mouth at bedtime as needed. 10 tablet 5  . diclofenac (VOLTAREN) 50 MG EC tablet Take 50 mg by mouth 3 (three) times daily.    . famotidine (PEPCID) 20 MG tablet TAKE 1 TABLET (20 MG TOTAL) BY MOUTH TWO (2) TIMES A DAY.    Marland Kitchen gabapentin (NEURONTIN) 600 MG tablet Take 600 mg by mouth 3 (three) times daily.    . hydrochlorothiazide (HYDRODIURIL) 25 MG tablet Take 25 mg by mouth daily.    . insulin glargine (LANTUS SOLOSTAR) 100 UNIT/ML Solostar Pen Inject 12 Units into the skin daily. 15 mL 5  . JANUVIA 50 MG tablet Take 1 tablet (50 mg total) by  mouth daily. 30 tablet 5  . metFORMIN (GLUCOPHAGE-XR) 500 MG 24 hr tablet TAKE 1 TABLET BY MOUTH DAILY WITH SUPPER. 90 tablet 0  . nortriptyline (PAMELOR) 10 MG capsule Take 20 mg by mouth at bedtime.    . pantoprazole (PROTONIX) 40 MG tablet Take 1 tablet (40 mg total) by mouth daily before breakfast. 90 tablet 3  . potassium chloride SA (KLOR-CON M) 20 MEQ tablet Take 1 tablet (20 mEq total) by mouth daily. 90 tablet 3  . pravastatin (PRAVACHOL) 40 MG tablet Take 1 tablet (40 mg total) by mouth daily. 90 tablet 3  . pregabalin (LYRICA) 50 MG capsule Take 1 capsule (50 mg total) by mouth 3 (three) times daily. 90 capsule 0  . sildenafil (VIAGRA) 100 MG tablet Take 1 tablet (100 mg total) by mouth daily as needed for erectile dysfunction. 30 tablet 3  . triamcinolone cream (KENALOG) 0.5 % Apply 1 application topically 2  (two) times daily. To affected areas, for up to 2 weeks. 30 g 2   No current facility-administered medications for this visit.      REVIEW OF SYSTEMS (Negative unless checked)  Constitutional: '[]'$ Weight loss  '[]'$ Fever  '[]'$ Chills Cardiac: '[]'$ Chest pain   '[]'$ Chest pressure   '[]'$ Palpitations   '[]'$ Shortness of breath when laying flat   '[]'$ Shortness of breath at rest   '[x]'$ Shortness of breath with exertion. Vascular:  '[x]'$ Pain in legs with walking   '[x]'$ Pain in legs at rest   '[]'$ Pain in legs when laying flat   '[]'$ Claudication   '[]'$ Pain in feet when walking  '[]'$ Pain in feet at rest  '[]'$ Pain in feet when laying flat   '[]'$ History of DVT   '[]'$ Phlebitis   '[]'$ Swelling in legs   '[]'$ Varicose veins   '[]'$ Non-healing ulcers Pulmonary:   '[]'$ Uses home oxygen   '[]'$ Productive cough   '[]'$ Hemoptysis   '[]'$ Wheeze  '[x]'$ COPD   '[]'$ Asthma Neurologic:  '[]'$ Dizziness  '[]'$ Blackouts   '[]'$ Seizures   '[]'$ History of stroke   '[]'$ History of TIA  '[]'$ Aphasia   '[]'$ Temporary blindness   '[]'$ Dysphagia   '[]'$ Weakness or numbness in arms   '[x]'$ Weakness or numbness in legs Musculoskeletal:  '[x]'$ Arthritis   '[]'$ Joint swelling   '[x]'$ Joint pain   '[]'$ Low back pain Hematologic:  '[]'$ Easy bruising  '[]'$ Easy bleeding   '[]'$ Hypercoagulable state   '[]'$ Anemic  '[]'$ Hepatitis Gastrointestinal:  '[]'$ Blood in stool   '[]'$ Vomiting blood  '[]'$ Gastroesophageal reflux/heartburn   '[]'$ Abdominal pain Genitourinary:  '[]'$ Chronic kidney disease   '[]'$ Difficult urination  '[]'$ Frequent urination  '[]'$ Burning with urination   '[]'$ Hematuria Skin:  '[]'$ Rashes   '[]'$ Ulcers   '[]'$ Wounds Psychological:  '[]'$ History of anxiety   '[]'$  History of major depression.    Physical Exam BP 137/83 (BP Location: Right Arm)   Pulse 90   Resp 18   Ht '5\' 6"'$  (1.676 m)   Wt 168 lb 6.4 oz (76.4 kg)   BMI 27.18 kg/m  Gen:  WD/WN, NAD. Appears older than stated age. Strong tobacco odor present. Head: [place]/AT, No temporalis wasting.  Ear/Nose/Throat: Hearing grossly intact, nares w/o erythema or drainage, oropharynx w/o Erythema/Exudate Eyes: Conjunctiva  clear, sclera non-icteric  Neck: trachea midline.  No JVD.  Pulmonary:  Good air movement, respirations not labored, no use of accessory muscles  Cardiac: RRR, no JVD Vascular:  Vessel Right Left  Radial Palpable Palpable                          DP 2+ NP  PT 1+ trace   Gastrointestinal:.  No masses, surgical incisions, or scars. Musculoskeletal: M/S 5/5 throughout.  Extremities without ischemic changes.  No deformity or atrophy. Trace LE edema. Neurologic: Sensation grossly intact in extremities.  Symmetrical.  Speech is fluent. Motor exam as listed above. Psychiatric: Judgment intact, Mood & affect appropriate for pt's clinical situation. Dermatologic: No rashes or ulcers noted.  No cellulitis or open wounds.    Radiology No results found.  Labs No results found for this or any previous visit (from the past 2160 hour(s)).  Assessment/Plan:  Bilateral carotid artery stenosis Has not been checked in over a year.  His lower extremity symptoms will take precedence initially, but this can be checked on follow-up visits in the future.  Hypertension blood pressure control important in reducing the progression of atherosclerotic disease. On appropriate oral medications.   Type 2 diabetes mellitus with diabetic neuropathy, with long-term current use of insulin (HCC) blood glucose control important in reducing the progression of atherosclerotic disease. Also, involved in wound healing. On appropriate medications.   Smoking Presents and the atherosclerotic risk factor for his multiple atherosclerotic vascular issues.  Would benefit from smoking cessation.  Atherosclerosis of native arteries of extremity with rest pain ([place]) I do not have the images of the MRI for review, but by report there is a near occlusive stenosis of the left iliac artery.  The symptoms began in the hip and buttock area and radiate down the leg consistent with iliac level disease.    Recommend:  The  patient has evidence of severe atherosclerotic changes of both lower extremities with rest pain that is associated with preulcerative changes and impending tissue loss of the left foot.  This represents a limb threatening ischemia and places the patient at the risk for left limb loss.  Patient should undergo angiography of the left lower extremity with the hope for intervention for limb salvage.  The risks and benefits as well as the alternative therapies was discussed in detail with the patient.  All questions were answered.  Patient agrees to proceed with left lower extremity angiography.  The patient will follow up with me in the office after the procedure.          [name] 07/17/2022, 10:36 AM   This note was created with Dragon medical transcription system.  Any errors from dictation are unintentional.

## 2022-07-17 NOTE — Assessment & Plan Note (Signed)
I do not have the images of the MRI for review, but by report there is a near occlusive stenosis of the left iliac artery.  The symptoms began in the hip and buttock area and radiate down the leg consistent with iliac level disease.    Recommend:  The patient has evidence of severe atherosclerotic changes of both lower extremities with rest pain that is associated with preulcerative changes and impending tissue loss of the left foot.  This represents a limb threatening ischemia and places the patient at the risk for left limb loss.  Patient should undergo angiography of the left lower extremity with the hope for intervention for limb salvage.  The risks and benefits as well as the alternative therapies was discussed in detail with the patient.  All questions were answered.  Patient agrees to proceed with left lower extremity angiography.  The patient will follow up with me in the office after the procedure.

## 2022-07-17 NOTE — Assessment & Plan Note (Signed)
Presents and the atherosclerotic risk factor for his multiple atherosclerotic vascular issues.  Would benefit from smoking cessation.

## 2022-07-17 NOTE — Patient Instructions (Signed)
Angiogram, Care After After an angiogram, it is common to have these problems at the point where the catheter was inserted (insertion area): Bruising. Tenderness. A collection of blood (hematoma). This may feel like a small lump under the skin at the insertion site. Follow these instructions at home: Insertion area care  Follow instructions from your doctor about how to take care of your insertion area. Make sure you: Wash your hands with soap and water for at least 20 seconds before and after you change your bandage. If you cannot use soap and water, use hand sanitizer. Change your bandage as told by your doctor. Do not take baths, swim, or use a hot tub. You may shower 24-48 hours after the procedure, or as told by your doctor. To clean the area: Gently wash the area with plain soap and water. Pat the area dry with a clean towel. Do not rub the area. This may cause bleeding. Do not put powder or lotion on the area. Keep the area clean and dry. Check your insertion area every day for signs of infection. Check for: Redness, swelling, or pain. Fluid or blood. Warmth. Pus or a bad smell. Activity If you were given a sedative during your procedure, do not drive or use machines until your doctor says that it is safe. A sedative is a medicine that helps you relax. Rest as told by your doctor. This is usually for 1-2 days. If the catheter was inserted through your leg, avoid walking up or down the stairs for a few days. If the catheter was inserted through your wrist, avoid hand and wrist movement for a few days. Return to your normal activities as told by your doctor. Ask your doctor what activities are safe for you. General instructions If the insertion area starts to bleed, lie flat and put pressure on the area. If the bleeding does not stop, get help right away. This is an emergency. Take over-the-counter and prescription medicines only as told by your doctor. Drink enough fluid to keep  your pee (urine) pale yellow. Keep all follow-up visits. This is needed for your safety and treatment. Your doctor may give you more instructions. Make sure you know what you can and cannot do. Contact a doctor if: You have a fever. You have chills. You have any signs of infection at the insertion area. You have more bruising around the insertion area. Get help right away if: You have a lot of pain in the insertion area. The insertion area swells very fast. The insertion area is bleeding, and the bleeding does not stop after you hold steady pressure on the area. The area around the insertion area becomes pale, cool, tingly, or numb. You have pain in the limb where the catheter was inserted. You have chest pain. You have trouble breathing. You have any signs of a stroke. "BE FAST" is an easy way to remember the main warning signs: B - Balance. Dizziness, sudden trouble walking, or loss of balance. E - Eyes. Trouble seeing or a change in how you see. F - Face. Sudden weakness or loss of feeling of the face. The face or eyelid may droop on one side. A - Arms. Weakness or loss of feeling in an arm. This happens all of a sudden and most often on one side of the body. S - Speech. Sudden trouble speaking, slurred speech, or trouble understanding what people say. T - Time. Time to call emergency services. Write down what time symptoms started.  You have other signs of a stroke, such as: A sudden, very bad headache with no known cause. Feeling like you may vomit (nausea). Vomiting. A seizure. These symptoms may be an emergency. Get help right away. Call 911. Do not wait to see if the symptoms will go away. Do not drive yourself to the hospital. This information is not intended to replace advice given to you by your health care provider. Make sure you discuss any questions you have with your health care provider. Document Revised: 12/03/2021 Document Reviewed: 12/03/2021 Elsevier Patient  Education  [place].

## 2022-07-17 NOTE — Assessment & Plan Note (Signed)
Has not been checked in over a year.  His lower extremity symptoms will take precedence initially, but this can be checked on follow-up visits in the future.

## 2022-07-17 NOTE — Assessment & Plan Note (Signed)
blood pressure control important in reducing the progression of atherosclerotic disease. On appropriate oral medications.  

## 2022-07-28 ENCOUNTER — Encounter (INDEPENDENT_AMBULATORY_CARE_PROVIDER_SITE_OTHER): Payer: Self-pay | Admitting: Vascular Surgery

## 2022-07-28 ENCOUNTER — Telehealth (INDEPENDENT_AMBULATORY_CARE_PROVIDER_SITE_OTHER): Payer: Self-pay

## 2022-07-28 NOTE — Telephone Encounter (Signed)
Spoke with the patient's significant other and the patient is scheduled with Dr. Lucky Cowboy on 08/03/22 with a 8:30 am arrival time to the [facility]. Pre-procedure instructions were discussed and will be sent to Mychart and mailed.

## 2022-08-03 ENCOUNTER — Ambulatory Visit
Admission: RE | Admit: 2022-08-03 | Discharge: 2022-08-03 | Disposition: A | Discharge status: Home / Self Care | Payer: 59 | Attending: Vascular Surgery | Admitting: Vascular Surgery

## 2022-08-03 ENCOUNTER — Encounter
Admission: RE | Disposition: A | Discharge status: Home / Self Care | Payer: Self-pay | Source: Home / Self Care | Attending: Vascular Surgery

## 2022-08-03 ENCOUNTER — Encounter: Payer: Self-pay | Admitting: Vascular Surgery

## 2022-08-03 ENCOUNTER — Other Ambulatory Visit: Payer: Self-pay

## 2022-08-03 DIAGNOSIS — I10 Essential (primary) hypertension: Secondary | ICD-10-CM | POA: Diagnosis not present

## 2022-08-03 DIAGNOSIS — I70212 Atherosclerosis of native arteries of extremities with intermittent claudication, left leg: Secondary | ICD-10-CM | POA: Diagnosis not present

## 2022-08-03 DIAGNOSIS — Z794 Long term (current) use of insulin: Secondary | ICD-10-CM | POA: Insufficient documentation

## 2022-08-03 DIAGNOSIS — I6523 Occlusion and stenosis of bilateral carotid arteries: Secondary | ICD-10-CM | POA: Diagnosis not present

## 2022-08-03 DIAGNOSIS — Z8673 Personal history of transient ischemic attack (TIA), and cerebral infarction without residual deficits: Secondary | ICD-10-CM | POA: Insufficient documentation

## 2022-08-03 DIAGNOSIS — Z833 Family history of diabetes mellitus: Secondary | ICD-10-CM | POA: Insufficient documentation

## 2022-08-03 DIAGNOSIS — Z7984 Long term (current) use of oral hypoglycemic drugs: Secondary | ICD-10-CM | POA: Diagnosis not present

## 2022-08-03 DIAGNOSIS — E1151 Type 2 diabetes mellitus with diabetic peripheral angiopathy without gangrene: Secondary | ICD-10-CM | POA: Diagnosis not present

## 2022-08-03 DIAGNOSIS — I70229 Atherosclerosis of native arteries of extremities with rest pain, unspecified extremity: Secondary | ICD-10-CM

## 2022-08-03 DIAGNOSIS — F17290 Nicotine dependence, other tobacco product, uncomplicated: Secondary | ICD-10-CM | POA: Diagnosis not present

## 2022-08-03 DIAGNOSIS — Z8249 Family history of ischemic heart disease and other diseases of the circulatory system: Secondary | ICD-10-CM | POA: Insufficient documentation

## 2022-08-03 DIAGNOSIS — I70221 Atherosclerosis of native arteries of extremities with rest pain, right leg: Secondary | ICD-10-CM | POA: Insufficient documentation

## 2022-08-03 HISTORY — PX: LOWER EXTREMITY ANGIOGRAPHY: CATH118251

## 2022-08-03 LAB — CREATININE, SERUM
Creatinine, Ser: 1.28 mg/dL — ABNORMAL HIGH (ref 0.61–1.24)
GFR, Estimated: >60 mL/min (ref >60)

## 2022-08-03 LAB — BUN: BUN: 19 mg/dL (ref 8–23)

## 2022-08-03 LAB — GLUCOSE, CAPILLARY
Glucose-Capillary: 202 mg/dL — ABNORMAL HIGH (ref 70–99)
Glucose-Capillary: 150 mg/dL — ABNORMAL HIGH (ref 70–99)

## 2022-08-03 SURGERY — LOWER EXTREMITY ANGIOGRAPHY
Anesthesia: Moderate Sedation | Site: Leg Lower | Laterality: Left

## 2022-08-03 MED ORDER — SODIUM CHLORIDE 0.9% FLUSH: 3.0000 mL | INTRAVENOUS | Status: DC | PRN

## 2022-08-03 MED ORDER — SODIUM CHLORIDE 0.9 % IV SOLN: 250.0000 mL | INTRAVENOUS | Status: DC | PRN

## 2022-08-03 MED ORDER — FENTANYL CITRATE (PF) 100 MCG/2ML IJ SOLN
INTRAMUSCULAR | Status: AC
  Filled 2022-08-03: qty 2

## 2022-08-03 MED ORDER — ASPIRIN 81 MG PO TBEC
DELAYED_RELEASE_TABLET | ORAL | Status: AC
  Administered 2022-08-03: 81 mg via ORAL
  Filled 2022-08-03: qty 1

## 2022-08-03 MED ORDER — HYDRALAZINE HCL 20 MG/ML IJ SOLN: 5.0000 mg | INTRAMUSCULAR | Status: DC | PRN

## 2022-08-03 MED ORDER — HYDROMORPHONE HCL 1 MG/ML IJ SOLN: 1.0000 mg | Freq: Once | INTRAMUSCULAR | Status: DC | PRN

## 2022-08-03 MED ORDER — HEPARIN SODIUM (PORCINE) 1000 UNIT/ML IJ SOLN
INTRAMUSCULAR | Status: DC | PRN
  Administered 2022-08-03: 5000 [IU] via INTRAVENOUS

## 2022-08-03 MED ORDER — SODIUM CHLORIDE 0.9 % IV SOLN: INTRAVENOUS | Status: DC

## 2022-08-03 MED ORDER — FAMOTIDINE 20 MG PO TABS: 40.0000 mg | ORAL_TABLET | Freq: Once | ORAL | Status: DC | PRN

## 2022-08-03 MED ORDER — MIDAZOLAM HCL 2 MG/2ML IJ SOLN
INTRAMUSCULAR | Status: AC
  Filled 2022-08-03: qty 4

## 2022-08-03 MED ORDER — CEFAZOLIN SODIUM-DEXTROSE 2-4 GM/100ML-% IV SOLN
2.0000 g | INTRAVENOUS | Status: AC
  Administered 2022-08-03: 2 g via INTRAVENOUS

## 2022-08-03 MED ORDER — ONDANSETRON HCL 4 MG/2ML IJ SOLN: 4.0000 mg | Freq: Four times a day (QID) | INTRAMUSCULAR | Status: DC | PRN

## 2022-08-03 MED ORDER — HEPARIN SODIUM (PORCINE) 1000 UNIT/ML IJ SOLN
INTRAMUSCULAR | Status: AC
  Filled 2022-08-03: qty 10

## 2022-08-03 MED ORDER — MIDAZOLAM HCL 2 MG/ML PO SYRP: 8.0000 mg | ORAL_SOLUTION | Freq: Once | ORAL | Status: DC | PRN

## 2022-08-03 MED ORDER — FENTANYL CITRATE (PF) 100 MCG/2ML IJ SOLN
INTRAMUSCULAR | Status: DC | PRN
  Administered 2022-08-03: 25 ug via INTRAVENOUS
  Administered 2022-08-03: 50 ug via INTRAVENOUS
  Administered 2022-08-03: 25 ug via INTRAVENOUS

## 2022-08-03 MED ORDER — IODIXANOL 320 MG/ML IV SOLN
INTRAVENOUS | Status: DC | PRN
  Administered 2022-08-03: 75 mL via INTRA_ARTERIAL

## 2022-08-03 MED ORDER — CLOPIDOGREL BISULFATE 75 MG PO TABS
ORAL_TABLET | ORAL | Status: AC
  Administered 2022-08-03: 75 mg via ORAL
  Filled 2022-08-03: qty 1

## 2022-08-03 MED ORDER — MIDAZOLAM HCL 2 MG/2ML IJ SOLN
INTRAMUSCULAR | Status: DC | PRN
  Administered 2022-08-03: 1 mg via INTRAVENOUS
  Administered 2022-08-03: 2 mg via INTRAVENOUS

## 2022-08-03 MED ORDER — ACETAMINOPHEN 325 MG PO TABS: 650.0000 mg | ORAL_TABLET | ORAL | Status: DC | PRN

## 2022-08-03 MED ORDER — CLOPIDOGREL BISULFATE 75 MG PO TABS: 75.0000 mg | ORAL_TABLET | Freq: Every day | ORAL | 11 refills | Status: DC

## 2022-08-03 MED ORDER — METHYLPREDNISOLONE SODIUM SUCC 125 MG IJ SOLR: 125.0000 mg | Freq: Once | INTRAMUSCULAR | Status: DC | PRN

## 2022-08-03 MED ORDER — ASPIRIN 81 MG PO TBEC: 81.0000 mg | DELAYED_RELEASE_TABLET | Freq: Every day | ORAL | 2 refills | Status: AC

## 2022-08-03 MED ORDER — LABETALOL HCL 5 MG/ML IV SOLN: 10.0000 mg | INTRAVENOUS | Status: DC | PRN

## 2022-08-03 MED ORDER — SODIUM CHLORIDE 0.9% FLUSH: 3.0000 mL | Freq: Two times a day (BID) | INTRAVENOUS | Status: DC

## 2022-08-03 MED ORDER — DIPHENHYDRAMINE HCL 50 MG/ML IJ SOLN: 50.0000 mg | Freq: Once | INTRAMUSCULAR | Status: DC | PRN

## 2022-08-03 MED ORDER — CLOPIDOGREL BISULFATE 75 MG PO TABS: 75.0000 mg | ORAL_TABLET | Freq: Every day | ORAL | Status: DC

## 2022-08-03 MED ORDER — ASPIRIN 81 MG PO TBEC: 81.0000 mg | DELAYED_RELEASE_TABLET | Freq: Every day | ORAL | Status: DC

## 2022-08-03 SURGICAL SUPPLY — 26 items
BALLN LUTONIX 018 5X100X130 (BALLOONS) ×1
BALLN LUTONIX 018 6X80X130 (BALLOONS) ×1
BALLN LUTONIX DCB 5X100X130 (BALLOONS) ×1
BALLN ULTRVRSE 8X80X75 (BALLOONS) ×1
BALLOON LUTONIX 018 5X100X130 (BALLOONS) IMPLANT
BALLOON LUTONIX 018 6X80X130 (BALLOONS) IMPLANT
BALLOON LUTONIX DCB 5X100X130 (BALLOONS) IMPLANT
BALLOON ULTRVRSE 8X80X75 (BALLOONS) IMPLANT
CATH ANGIO 5F PIGTAIL 65CM (CATHETERS) IMPLANT
CATH BEACON 5 .035 65 KMP TIP (CATHETERS) IMPLANT
CATH TEMPO 5F RIM 65CM (CATHETERS) IMPLANT
COVER PROBE ULTRASOUND 5X96 (MISCELLANEOUS) IMPLANT
DEVICE STARCLOSE SE CLOSURE (Vascular Products) IMPLANT
GLIDEWIRE ADV .035X180CM (WIRE) IMPLANT
INTRODUCER 7FR 23CM (INTRODUCER) IMPLANT
KIT ENCORE 26 ADVANTAGE (KITS) IMPLANT
PACK ANGIOGRAPHY (CUSTOM PROCEDURE TRAY) ×1 IMPLANT
SHEATH BRITE TIP 5FRX11 (SHEATH) IMPLANT
STENT LIFESTAR 10X80 (Permanent Stent) IMPLANT
STENT LIFESTREAM 9X38X80 (Permanent Stent) IMPLANT
STENT VIABAHN 7X7.5X120 (Permanent Stent) IMPLANT
SYR MEDRAD MARK 7 150ML (SYRINGE) IMPLANT
TUBING CONTRAST HIGH PRESS 72 (TUBING) IMPLANT
WIRE G V18X300CM (WIRE) IMPLANT
WIRE GUIDERIGHT .035X150 (WIRE) IMPLANT
WIRE SUPRACORE 300CM (WIRE) IMPLANT

## 2022-08-03 NOTE — Op Note (Signed)
[facility]  Percutaneous Study/Intervention Procedural Note   Date of Surgery: 08/03/2022  Surgeon(s):Hagan Vanauken    Assistants:none  Pre-operative Diagnosis: PAD with claudication LLE  Post-operative diagnosis:  Same  Procedure(s) Performed:             1.  Ultrasound guidance for vascular access right femoral artery             2.  Catheter placement into left common femoral artery from right femoral approach             3.  Aortogram and selective left lower extremity angiogram             4.  Percutaneous transluminal angioplasty of left common femoral artery and external iliac artery with 5 mm diameter by 10 cm length Lutonix drug-coated angioplasty balloon             5.  Stent placement to the left common femoral artery with 7 mm diameter by 7.5 cm length Viabahn stent  6.  Stent placement to the left external iliac artery with 10 mm diameter by 8 cm length life star stent  7.  Stent placement to the left common iliac artery with 9 mm diameter by 38 mm length Lifestream stent             8.  StarClose closure device right femoral artery  EBL: 10 cc  Contrast: 75 cc  Fluoro Time: 10.5 minutes  Moderate Conscious Sedation Time: approximately 58 minutes using 3 mg of Versed and 100 mcg of Fentanyl              Indications:  Patient is a 67 y.o.male with disabling claudication symptoms of the left lower extremity. The patient has an MRI suggesting high-grade stenosis or occlusion of the left iliac system. The patient is brought in for angiography for further evaluation and potential treatment. Risks and benefits are discussed and informed consent is obtained.   Procedure:  The patient was identified and appropriate procedural time out was performed.  The patient was then placed supine on the table and prepped and draped in the usual sterile fashion. Moderate conscious sedation was administered during a face to face encounter with the patient throughout the  procedure with my supervision of the RN administering medicines and monitoring the patient's vital signs, pulse oximetry, telemetry and mental status throughout from the start of the procedure until the patient was taken to the recovery room. Ultrasound was used to evaluate the right common femoral artery.  It was patent .  A digital ultrasound image was acquired.  A Seldinger needle was used to access the right common femoral artery under direct ultrasound guidance and a permanent image was performed.  A 0.035 J wire was advanced without resistance and a 5Fr sheath was placed.  Pigtail catheter was placed into the aorta and an AP aortogram was performed. This demonstrated normal renal arteries and normal aorta.  The right common iliac artery was somewhat ectatic but not stenotic.  The right external iliac artery and internal iliac arteries were fairly normal.  The left common iliac artery had about a 65 to 70% stenosis in the midsegment with a little poststenotic dilatation.  The internal iliac artery had some mild disease and there was occlusion of the external iliac artery just beyond its origin.  There was reconstitution in the left common femoral artery. I then crossed the aortic bifurcation and advanced to the left femoral head.  This was  somewhat tedious due to the occlusion in the iliac artery and ultimately had to exchange for a rim and then a Kumpe catheter and the advantage wire and reentered the lumen in the left common femoral artery with disease that was fairly significant above this. Selective left lower extremity angiogram was then performed. This demonstrated fairly normal superficial femoral artery, popliteal artery, and tibial vessels with two-vessel runoff to the foot although the flow was sluggish due to the inflow lesion. It was felt that it was in the patient's best interest to proceed with intervention after these images to avoid a second procedure and a larger amount of contrast and  fluoroscopy based off of the findings from the initial angiogram. The patient was systemically heparinized and a 7 French 23 cm sheath was then placed over the Terumo Advantage wire. I then exchanged for a 0.018 wire.  A 5 mm diameter by 10 cm length Lutonix drug-coated angioplasty balloon was inflated in the left common femoral artery and external iliac artery up to 10 atm for 1 minute.  Completion imaging showed greater than 50% residual stenosis in the left external iliac artery and common femoral artery and I elected to proceed with stent placement.  A 7 mm diameter by 7.5 cm length Viabahn stent was then deployed in the left common femoral artery taking care to stay just above the origin of the profunda femoris artery using an LAO projection.  This was postdilated with a 6 mm diameter by 8 cm length Lutonix drug-coated angioplasty balloon.  A 10 mm diameter by 8 cm length life star stent was then deployed in the left external iliac artery with a small amount of overlap into the common femoral stent and going just up into the common iliac artery so that there would be preserved flow in the left internal iliac artery.  This was postdilated with an 8 mm diameter Lutonix drug-coated angioplasty balloon.  Completion imaging showed less than 10% residual stenosis in the left external iliac artery and left common femoral artery with patent stents and preserved flow distally.  I then addressed the left common iliac artery lesion which was separate and distinct.  After exchanging back for a 0.035 wire a 9 mm diameter by 38 mm length Lifestream stent was deployed just into the left common iliac artery only 2 to 3 mm beyond the origin and going down to the left distal common iliac artery just above the hypogastric artery with about 2 to 3 mm of overlap between the life star stent and the new stent.  This was inflated to 14 atm with excellent angiographic completion result and less than 10% residual stenosis in the left  common iliac artery.  I elected to terminate the procedure. The sheath was removed and StarClose closure device was deployed in the right femoral artery with excellent hemostatic result. The patient was taken to the recovery room in stable condition having tolerated the procedure well.  Findings:               Aortogram:  This demonstrated normal renal arteries and normal aorta.  The right common iliac artery was somewhat ectatic but not stenotic.  The right external iliac artery and internal iliac arteries were fairly normal.  The left common iliac artery had about a 65 to 70% stenosis in the midsegment with a little poststenotic dilatation.  The internal iliac artery had some mild disease and there was occlusion of the external iliac artery just beyond its origin.  There was reconstitution in the left common femoral artery.             Left lower Extremity:  This demonstrated disease in the proximal to mid left common femoral artery with greater than 50% residual stenosis likely down to our reentry point in the mid left common femoral artery.  There was fairly normal superficial femoral artery, popliteal artery, and tibial vessels with two-vessel runoff to the foot although the flow was sluggish due to the inflow lesion.   Disposition: Patient was taken to the recovery room in stable condition having tolerated the procedure well.  Complications: None  Devin Mayer 08/03/2022 10:38 AM   This note was created with Dragon Medical transcription system. Any errors in dictation are purely unintentional.

## 2022-08-03 NOTE — Interval H&P Note (Signed)
History and Physical Interval Note:  08/03/2022 8:19 AM  Devin Mayer  has presented today for surgery, with the diagnosis of LLE Angio  BARD   ASO w rest pain.  The various methods of treatment have been discussed with the patient and family. After consideration of risks, benefits and other options for treatment, the patient has consented to  Procedure(s): Lower Extremity Angiography (Left) as a surgical intervention.  The patient's history has been reviewed, patient examined, no change in status, stable for surgery.  I have reviewed the patient's chart and labs.  Questions were answered to the patient's satisfaction.     [name]

## 2022-08-04 ENCOUNTER — Encounter: Payer: Self-pay | Admitting: Vascular Surgery

## 2022-08-11 ENCOUNTER — Ambulatory Visit (INDEPENDENT_AMBULATORY_CARE_PROVIDER_SITE_OTHER): Payer: 59 | Admitting: Vascular Surgery

## 2022-08-11 ENCOUNTER — Telehealth (INDEPENDENT_AMBULATORY_CARE_PROVIDER_SITE_OTHER): Payer: Self-pay

## 2022-08-11 NOTE — Telephone Encounter (Signed)
Patient friend left a message stating that the patient was having pain from knee down to ankle. Patient is requesting for pain medication. Patient had left lower extremity angio on 08/03/22. Tramadol 50mg  #20 was called into [facility] per Dr Lucky Cowboy

## 2022-08-25 ENCOUNTER — Other Ambulatory Visit (INDEPENDENT_AMBULATORY_CARE_PROVIDER_SITE_OTHER): Payer: Self-pay | Admitting: Vascular Surgery

## 2022-08-25 DIAGNOSIS — I739 Peripheral vascular disease, unspecified: Secondary | ICD-10-CM

## 2022-09-02 ENCOUNTER — Ambulatory Visit (INDEPENDENT_AMBULATORY_CARE_PROVIDER_SITE_OTHER): Payer: 59

## 2022-09-02 ENCOUNTER — Ambulatory Visit (INDEPENDENT_AMBULATORY_CARE_PROVIDER_SITE_OTHER): Payer: 59 | Admitting: Nurse Practitioner

## 2022-09-02 ENCOUNTER — Encounter (INDEPENDENT_AMBULATORY_CARE_PROVIDER_SITE_OTHER): Payer: Self-pay | Admitting: Nurse Practitioner

## 2022-09-02 VITALS — BP 118/76 | HR 83 | Resp 16 | Wt 170.0 lb

## 2022-09-02 DIAGNOSIS — I10 Essential (primary) hypertension: Secondary | ICD-10-CM

## 2022-09-02 DIAGNOSIS — G894 Chronic pain syndrome: Secondary | ICD-10-CM

## 2022-09-02 DIAGNOSIS — I6523 Occlusion and stenosis of bilateral carotid arteries: Secondary | ICD-10-CM

## 2022-09-02 DIAGNOSIS — I739 Peripheral vascular disease, unspecified: Secondary | ICD-10-CM

## 2022-09-02 DIAGNOSIS — Z9889 Other specified postprocedural states: Secondary | ICD-10-CM

## 2022-09-03 ENCOUNTER — Encounter (INDEPENDENT_AMBULATORY_CARE_PROVIDER_SITE_OTHER): Payer: Self-pay | Admitting: Nurse Practitioner

## 2022-09-03 LAB — VAS US ABI WITH/WO TBI
Left ABI: 1.05
Right ABI: 1.02

## 2022-09-03 NOTE — Progress Notes (Signed)
Subjective:    Patient ID: Devin Mayer, male    DOB: 1956/03/29, 67 y.o.   MRN: [id number] Chief Complaint  Patient presents with   Follow-up    [facility] 4 week follow up    Devin Mayer is a [age] y.o. male.  He returns today following angiogram of the left lower extremity.  Prior to intervention he had chronic numbness and pain in his left lower extremity.  Which showed a left iliac artery stenosis with near occlusion of the left iliac artery.  Prior to intervention his symptoms started in the hip and buttock area radiating down the leg constantly.  He underwent intervention on 08/03/2022 including:  Procedure(s) Performed:             1.  Ultrasound guidance for vascular access right femoral artery             2.  Catheter placement into left common femoral artery from right femoral approach             3.  Aortogram and selective left lower extremity angiogram             4.  Percutaneous transluminal angioplasty of left common femoral artery and external iliac artery with 5 mm diameter by 10 cm length Lutonix drug-coated angioplasty balloon             5.  Stent placement to the left common femoral artery with 7 mm diameter by 7.5 cm length Viabahn stent             6.  Stent placement to the left external iliac artery with 10 mm diameter by 8 cm length life star stent             7.  Stent placement to the left common iliac artery with 9 mm diameter by 38 mm length Lifestream stent             8.  StarClose closure device right femoral artery   He notes that following the intervention his leg pain is exactly the same.  He has not noticed any significant decrease in claudication or pain in his left lower extremity.  He notes that the pain continues to be unrelenting with numbness and pain and discomfort that he had prior to his intervention.  Following intervention the patient has ABI of 1.02 on the right and 1.05 on the left.  He has strong triphasic tibial artery waveforms on the left  with good toe waveforms.  He has triphasic/biphasic waveforms with slightly dampened toe waveforms on the right    Review of Systems  Musculoskeletal:  Positive for gait problem.  Neurological:  Positive for weakness.  All other systems reviewed and are negative.      Objective:   Physical Exam Vitals reviewed.  HENT:     Head: Normocephalic.  Cardiovascular:     Rate and Rhythm: Normal rate.     Pulses:          Dorsalis pedis pulses are detected w/ Doppler on the right side and detected w/ Doppler on the left side.       Posterior tibial pulses are detected w/ Doppler on the right side and detected w/ Doppler on the left side.  Pulmonary:     Effort: Pulmonary effort is normal.  Skin:    General: Skin is warm and dry.  Neurological:     Mental Status: He is alert and oriented to person, place, and time.  Motor: Weakness present.     Gait: Gait abnormal.  Psychiatric:        Mood and Affect: Mood normal.        Behavior: Behavior normal.        Thought Content: Thought content normal.        Judgment: Judgment normal.     BP 118/76 (BP Location: Right Arm)   Pulse 83   Resp 16   Wt 170 lb (77.1 kg)   BMI 27.44 kg/m   Past Medical History:  Diagnosis Date   Cancer    COPD (chronic obstructive pulmonary disease)    Diabetes mellitus without complication    Hyperlipidemia    Hypertension    Stroke     Social History   Socioeconomic History   Marital status: Divorced    Spouse name: Not on file   Number of children: Not on file   Years of education: Not on file   Highest education level: Not on file  Occupational History   Not on file  Tobacco Use   Smoking status: Every Day    Types: Cigars   Smokeless tobacco: Never  Substance and Sexual Activity   Alcohol use: Yes   Drug use: Never   Sexual activity: Not on file  Other Topics Concern   Not on file  Social History Narrative   Not on file   Social Determinants of Health   Financial  Resource Strain: Not on file  Food Insecurity: Not on file  Transportation Needs: Not on file  Physical Activity: Not on file  Stress: Not on file  Social Connections: Not on file  Intimate Partner Violence: Not on file    Past Surgical History:  Procedure Laterality Date   CHOLECYSTECTOMY     LOWER EXTREMITY ANGIOGRAPHY Left 08/03/2022   Procedure: Lower Extremity Angiography;  Surgeon: Annice Needy, MD;  Location: [facility];  Service: Cardiovascular;  Laterality: Left;   SHOULDER SURGERY Bilateral     Family History  Problem Relation Age of Onset   Heart attack Father    Diabetes Father    Heart attack Paternal Uncle    Lung cancer Maternal Grandmother    Lung cancer Paternal Grandmother     Allergies  Allergen Reactions   Latex Rash       Latest Ref Rng & Units 08/21/2021    4:33 PM 08/15/2021   12:20 PM 07/10/2020   10:48 AM  CBC  WBC 4.0 - 10.5 K/uL 7.4  5.2  7.2   Hemoglobin 13.0 - 17.0 g/dL 16.1  09.6  04.5   Hematocrit 39.0 - 52.0 % 50.7  52.3  49.4   Platelets 150 - 400 K/uL 241  212  213       CMP     Component Value Date/Time   NA 132 (L) 08/21/2021 1633   NA 137 04/13/2013 1040   K 4.8 08/21/2021 1633   K 4.3 04/13/2013 1040   CL 96 (L) 08/21/2021 1633   CL 107 04/13/2013 1040   CO2 25 08/21/2021 1633   CO2 25 04/13/2013 1040   GLUCOSE 452 (H) 08/21/2021 1633   GLUCOSE 155 (H) 04/13/2013 1040   BUN 19 08/03/2022 0839   BUN 15 04/13/2013 1040   CREATININE 1.28 (H) 08/03/2022 0839   CREATININE 1.20 07/02/2021 1522   CALCIUM 9.3 08/21/2021 1633   CALCIUM 9.3 04/13/2013 1040   PROT 7.9 08/21/2021 1633   PROT 7.3 04/13/2013 1040  ALBUMIN 4.2 08/21/2021 1633   ALBUMIN 4.1 04/13/2013 1040   AST 37 08/21/2021 1633   AST 23 04/13/2013 1040   ALT 58 (H) 08/21/2021 1633   ALT 32 04/13/2013 1040   ALKPHOS 82 08/21/2021 1633   ALKPHOS 98 04/13/2013 1040   BILITOT 0.8 08/21/2021 1633   BILITOT 0.6 04/13/2013 1040   GFRNONAA >60  08/03/2022 0839   GFRNONAA >60 04/13/2013 1040   GFRAA >60 12/19/2016 0113   GFRAA >60 04/13/2013 1040     No results found.     Assessment & Plan:   1. Peripheral arterial disease with history of revascularization Today his noninvasive studies show good perfusion left lower extremity.  Given that there has been no change whatsoever in his symptoms, this is largely related to neuropathic, chronic pain issues.  The patient is advised to continue with aspirin and Plavix.  Will have her return in 3 months for noninvasive studies. - VAS [place] ABI WITH/WO TBI  2. Chronic pain syndrome We will refer the patient per his request to the pain clinic. - Ambulatory referral to Pain Clinic  3. Primary hypertension Continue antihypertensive medications as already ordered, these medications have been reviewed and there are no changes at this time.    4. Bilateral carotid artery stenosis Patient had 1 to 39% stenosis a year ago.  We will have him return at his follow-up and obtain carotid duplex so that we can keep an eye on his carotid stenosis.  Current Outpatient Medications on File Prior to Visit  Medication Sig Dispense Refill   albuterol (VENTOLIN HFA) 108 (90 Base) MCG/ACT inhaler Inhale 2 puffs into the lungs every 6 (six) hours as needed.     aspirin EC 81 MG tablet Take 1 tablet (81 mg total) by mouth daily. Swallow whole. 150 tablet 2   clonazePAM (KLONOPIN) 0.5 MG tablet Take 1 tablet (0.5 mg total) by mouth at bedtime as needed. 10 tablet 5   clopidogrel (PLAVIX) 75 MG tablet Take 1 tablet (75 mg total) by mouth daily. 30 tablet 11   diclofenac (VOLTAREN) 50 MG EC tablet Take 50 mg by mouth 3 (three) times daily.     famotidine (PEPCID) 20 MG tablet TAKE 1 TABLET (20 MG TOTAL) BY MOUTH TWO (2) TIMES A DAY.     gabapentin (NEURONTIN) 600 MG tablet Take 600 mg by mouth 3 (three) times daily.     hydrochlorothiazide (HYDRODIURIL) 25 MG tablet Take 25 mg by mouth daily.     insulin  glargine (LANTUS SOLOSTAR) 100 UNIT/ML Solostar Pen Inject 12 Units into the skin daily. 15 mL 5   JANUVIA 50 MG tablet Take 1 tablet (50 mg total) by mouth daily. 30 tablet 5   metFORMIN (GLUCOPHAGE-XR) 500 MG 24 hr tablet TAKE 1 TABLET BY MOUTH DAILY WITH SUPPER. 90 tablet 0   nortriptyline (PAMELOR) 10 MG capsule Take 20 mg by mouth at bedtime.     pantoprazole (PROTONIX) 40 MG tablet Take 1 tablet (40 mg total) by mouth daily before breakfast. 90 tablet 3   potassium chloride SA (KLOR-CON M) 20 MEQ tablet Take 1 tablet (20 mEq total) by mouth daily. 90 tablet 3   pravastatin (PRAVACHOL) 40 MG tablet Take 1 tablet (40 mg total) by mouth daily. 90 tablet 3   pregabalin (LYRICA) 50 MG capsule Take 1 capsule (50 mg total) by mouth 3 (three) times daily. 90 capsule 0   sildenafil (VIAGRA) 100 MG tablet Take 1 tablet (100 mg total)  by mouth daily as needed for erectile dysfunction. 30 tablet 3   triamcinolone cream (KENALOG) 0.5 % Apply 1 application topically 2 (two) times daily. To affected areas, for up to 2 weeks. 30 g 2   No current facility-administered medications on file prior to visit.    There are no Patient Instructions on file for this visit. No follow-ups on file.   Georgiana Spinner, NP

## 2022-09-15 ENCOUNTER — Telehealth (INDEPENDENT_AMBULATORY_CARE_PROVIDER_SITE_OTHER): Payer: Self-pay | Admitting: Vascular Surgery

## 2022-09-15 NOTE — Telephone Encounter (Signed)
Left a voice mail to return call..  

## 2022-09-15 NOTE — Telephone Encounter (Signed)
I had a very long discussion with the spouse and his wife on 4/10.  His stents are open and patent but he has profound neuropathic issues.  He was having this pain at his follow up.  I placed a pain referral for them as they requested.  He should follow up with pain management as well as his PCP but this was all discussed at his follow up visit.  We also discussed that we would not provide pain medication

## 2022-09-15 NOTE — Telephone Encounter (Signed)
Patient spouse LVM stating that patient is having issues with his legs from where the stints were placed and want to know what are the next steps to do    Please call and advise

## 2022-09-21 ENCOUNTER — Other Ambulatory Visit (INDEPENDENT_AMBULATORY_CARE_PROVIDER_SITE_OTHER): Payer: Self-pay | Admitting: Nurse Practitioner

## 2022-09-21 DIAGNOSIS — G894 Chronic pain syndrome: Secondary | ICD-10-CM

## 2022-09-21 NOTE — Telephone Encounter (Signed)
Pt's spouse called in stating that the pain clinic will not see the patient and he is in extreme pain and falling. She stated that Dr. Wyn Quaker put in the stents and he needs to figure out what to do with him. States that she wants a pain dr in [place]. Please advise

## 2022-09-21 NOTE — Telephone Encounter (Signed)
Per the discussions that I had with the patient in office as well as the discussions that Dr. Wyn Quaker had with the patient.  The patient and his wife acknowledged that these issues existed before his stents were placed in his legs.  The patient had pain long before the stents were placed and he continues to have pain post stent placement.  I called and discussed with the patient and he confirmed that the pain and the following episodes were happening long before his stents were placed.  Given that he had peripheral arterial disease were hoping that this would make a difference but unfortunately it made no difference.  However that said he does have good circulation as evidenced by his studies about a week or so ago.  As the patient requested an office we sent to the pain management doctor that the patient themselves requested.  We will send another referral to a pain doctor in [place] as they are requesting however past this point they will need to speak with their primary care physician for further care.  This is because the pain that he is having is not vascular in nature and therefore we will not continue to workup this pain.  This pain is neurological and it has to do with his back.  We also offered to have the patient referred to a specialist that may be able to do a neurostimulator and they adamantly refused when they were in the office.  Based on this we will refer to the pain management doctor interim as they requested.  Past this they will need to see their PCP.  If the patient's wife has any additional issues with this she can speak with me directly.

## 2022-11-30 ENCOUNTER — Other Ambulatory Visit (INDEPENDENT_AMBULATORY_CARE_PROVIDER_SITE_OTHER): Payer: Self-pay | Admitting: Nurse Practitioner

## 2022-11-30 DIAGNOSIS — Z9889 Other specified postprocedural states: Secondary | ICD-10-CM

## 2022-11-30 DIAGNOSIS — I6523 Occlusion and stenosis of bilateral carotid arteries: Secondary | ICD-10-CM

## 2022-12-08 ENCOUNTER — Ambulatory Visit (INDEPENDENT_AMBULATORY_CARE_PROVIDER_SITE_OTHER): Payer: 59

## 2022-12-08 ENCOUNTER — Ambulatory Visit (INDEPENDENT_AMBULATORY_CARE_PROVIDER_SITE_OTHER): Payer: 59 | Admitting: Vascular Surgery

## 2022-12-08 VITALS — BP 135/81 | HR 91 | Resp 19 | Ht 65.0 in | Wt 167.8 lb

## 2022-12-08 DIAGNOSIS — Z9889 Other specified postprocedural states: Secondary | ICD-10-CM

## 2022-12-08 DIAGNOSIS — F17200 Nicotine dependence, unspecified, uncomplicated: Secondary | ICD-10-CM

## 2022-12-08 DIAGNOSIS — I739 Peripheral vascular disease, unspecified: Secondary | ICD-10-CM

## 2022-12-08 DIAGNOSIS — E1140 Type 2 diabetes mellitus with diabetic neuropathy, unspecified: Secondary | ICD-10-CM | POA: Diagnosis not present

## 2022-12-08 DIAGNOSIS — I10 Essential (primary) hypertension: Secondary | ICD-10-CM | POA: Diagnosis not present

## 2022-12-08 DIAGNOSIS — I70222 Atherosclerosis of native arteries of extremities with rest pain, left leg: Secondary | ICD-10-CM

## 2022-12-08 DIAGNOSIS — I6523 Occlusion and stenosis of bilateral carotid arteries: Secondary | ICD-10-CM

## 2022-12-08 DIAGNOSIS — Z794 Long term (current) use of insulin: Secondary | ICD-10-CM

## 2022-12-08 NOTE — Progress Notes (Unsigned)
MRN : [id number]  Devin Mayer is a 67 y.o. (03-15-1956) male who presents with chief complaint of  Chief Complaint  Patient presents with   Venous Insufficiency  .  History of Present Illness: Patient returns in follow-up of multiple vascular issues.  He underwent extensive left lower extremity revascularization  Current Outpatient Medications  Medication Sig Dispense Refill   albuterol (VENTOLIN HFA) 108 (90 Base) MCG/ACT inhaler Inhale 2 puffs into the lungs every 6 (six) hours as needed.     aspirin EC 81 MG tablet Take 1 tablet (81 mg total) by mouth daily. Swallow whole. 150 tablet 2   clonazePAM (KLONOPIN) 0.5 MG tablet Take 1 tablet (0.5 mg total) by mouth at bedtime as needed. 10 tablet 5   clopidogrel (PLAVIX) 75 MG tablet Take 1 tablet (75 mg total) by mouth daily. 30 tablet 11   diclofenac (VOLTAREN) 50 MG EC tablet Take 50 mg by mouth 3 (three) times daily.     famotidine (PEPCID) 20 MG tablet TAKE 1 TABLET (20 MG TOTAL) BY MOUTH TWO (2) TIMES A DAY.     gabapentin (NEURONTIN) 600 MG tablet Take 600 mg by mouth 3 (three) times daily.     hydrochlorothiazide (HYDRODIURIL) 25 MG tablet Take 25 mg by mouth daily.     insulin glargine (LANTUS SOLOSTAR) 100 UNIT/ML Solostar Pen Inject 12 Units into the skin daily. 15 mL 5   JANUVIA 50 MG tablet Take 1 tablet (50 mg total) by mouth daily. 30 tablet 5   metFORMIN (GLUCOPHAGE-XR) 500 MG 24 hr tablet TAKE 1 TABLET BY MOUTH DAILY WITH SUPPER. 90 tablet 0   nortriptyline (PAMELOR) 10 MG capsule Take 20 mg by mouth at bedtime.     pantoprazole (PROTONIX) 40 MG tablet Take 1 tablet (40 mg total) by mouth daily before breakfast. 90 tablet 3   potassium chloride SA (KLOR-CON M) 20 MEQ tablet Take 1 tablet (20 mEq total) by mouth daily. 90 tablet 3   pravastatin (PRAVACHOL) 40 MG tablet Take 1 tablet (40 mg total) by mouth daily. 90 tablet 3   pregabalin (LYRICA) 50 MG capsule Take 1 capsule (50 mg total) by mouth 3 (three) times daily.  90 capsule 0   sildenafil (VIAGRA) 100 MG tablet Take 1 tablet (100 mg total) by mouth daily as needed for erectile dysfunction. 30 tablet 3   tiZANidine (ZANAFLEX) 4 MG tablet Take by mouth.     triamcinolone cream (KENALOG) 0.5 % Apply 1 application topically 2 (two) times daily. To affected areas, for up to 2 weeks. 30 g 2   No current facility-administered medications for this visit.    Past Medical History:  Diagnosis Date   Cancer [facility])    COPD (chronic obstructive pulmonary disease) (HCC)    Diabetes mellitus without complication (HCC)    Hyperlipidemia    Hypertension    Stroke [facility])     Past Surgical History:  Procedure Laterality Date   CHOLECYSTECTOMY     LOWER EXTREMITY ANGIOGRAPHY Left 08/03/2022   Procedure: Lower Extremity Angiography;  Surgeon: Annice Needy, MD;  Location: [facility] INVASIVE CV LAB;  Service: Cardiovascular;  Laterality: Left;   SHOULDER SURGERY Bilateral      Social History   Tobacco Use   Smoking status: Every Day    Types: Cigars   Smokeless tobacco: Never  Substance Use Topics   Alcohol use: Yes   Drug use: Never      Family History  Problem Relation Age  of Onset   Heart attack Father    Diabetes Father    Heart attack Paternal Uncle    Lung cancer Maternal Grandmother    Lung cancer Paternal Grandmother      Allergies  Allergen Reactions   Latex Rash    REVIEW OF SYSTEMS (Negative unless checked)   Constitutional: [] Weight loss  [] Fever  [] Chills Cardiac: [] Chest pain   [] Chest pressure   [] Palpitations   [] Shortness of breath when laying flat   [] Shortness of breath at rest   [x] Shortness of breath with exertion. Vascular:  [x] Pain in legs with walking   [x] Pain in legs at rest   [] Pain in legs when laying flat   [] Claudication   [] Pain in feet when walking  [] Pain in feet at rest  [] Pain in feet when laying flat   [] History of DVT   [] Phlebitis   [] Swelling in legs   [] Varicose veins   [] Non-healing ulcers Pulmonary:    [] Uses home oxygen   [] Productive cough   [] Hemoptysis   [] Wheeze  [x] COPD   [] Asthma Neurologic:  [] Dizziness  [] Blackouts   [] Seizures   [] History of stroke   [] History of TIA  [] Aphasia   [] Temporary blindness   [] Dysphagia   [] Weakness or numbness in arms   [x] Weakness or numbness in legs Musculoskeletal:  [x] Arthritis   [] Joint swelling   [x] Joint pain   [] Low back pain Hematologic:  [] Easy bruising  [] Easy bleeding   [] Hypercoagulable state   [] Anemic  [] Hepatitis Gastrointestinal:  [] Blood in stool   [] Vomiting blood  [] Gastroesophageal reflux/heartburn   [] Abdominal pain Genitourinary:  [] Chronic kidney disease   [] Difficult urination  [] Frequent urination  [] Burning with urination   [] Hematuria Skin:  [] Rashes   [] Ulcers   [] Wounds Psychological:  [] History of anxiety   []  History of major depression.   Physical Examination  Vitals:   12/08/22 1415  BP: 135/81  Pulse: 91  Resp: 19  Weight: 167 lb 12.8 oz (76.1 kg)  Height: 5\' 5"  (1.651 m)   Body mass index is 27.92 kg/m. Gen:  WD/WN, NAD Head: [place]/AT, No temporalis wasting. Ear/Nose/Throat: Hearing grossly intact, nares w/o erythema or drainage, trachea midline Eyes: Conjunctiva clear. Sclera non-icteric Neck: Supple.  *** bruit  Pulmonary:  Good air movement, equal and clear to auscultation bilaterally.  Cardiac: RRR, No JVD Vascular:  Vessel Right Left  Radial Palpable Palpable           Musculoskeletal: M/S 5/5 throughout.  No deformity or atrophy. *** edema. Neurologic: CN 2-12 intact. Sensation grossly intact in extremities.  Symmetrical.  Speech is fluent. Motor exam as listed above. Psychiatric: Judgment intact, Mood & affect appropriate for pt's clinical situation. Dermatologic: No rashes or ulcers noted.  No cellulitis or open wounds.    CBC Lab Results  Component Value Date   WBC 7.4 08/21/2021   HGB 17.4 (H) 08/21/2021   HCT 50.7 08/21/2021   MCV 91.4 08/21/2021   PLT 241 08/21/2021    BMET     Component Value Date/Time   NA 132 (L) 08/21/2021 1633   NA 137 04/13/2013 1040   K 4.8 08/21/2021 1633   K 4.3 04/13/2013 1040   CL 96 (L) 08/21/2021 1633   CL 107 04/13/2013 1040   CO2 25 08/21/2021 1633   CO2 25 04/13/2013 1040   GLUCOSE 452 (H) 08/21/2021 1633   GLUCOSE 155 (H) 04/13/2013 1040   BUN 19 08/03/2022 0839   BUN 15 04/13/2013 1040   CREATININE  1.28 (H) 08/03/2022 0839   CREATININE 1.20 07/02/2021 1522   CALCIUM 9.3 08/21/2021 1633   CALCIUM 9.3 04/13/2013 1040   GFRNONAA >60 08/03/2022 0839   GFRNONAA >60 04/13/2013 1040   GFRAA >60 12/19/2016 0113   GFRAA >60 04/13/2013 1040   CrCl cannot be calculated (Patient's most recent lab result is older than the maximum 21 days allowed.).  COAG No results found for: "INR", "PROTIME"  Radiology No results found.   Assessment/Plan No problem-specific Assessment & Plan notes found for this encounter.  Hypertension blood pressure control important in reducing the progression of atherosclerotic disease. On appropriate oral medications.     Type 2 diabetes mellitus with diabetic neuropathy, with long-term current use of insulin (HCC) blood glucose control important in reducing the progression of atherosclerotic disease. Also, involved in wound healing. On appropriate medications.     Smoking Presents and the atherosclerotic risk factor for his multiple atherosclerotic vascular issues.  Would benefit from smoking cessation.   Festus Barren, MD  12/08/2022 2:26 PM    This note was created with Dragon medical transcription system.  Any errors from dictation are purely unintentional

## 2022-12-09 NOTE — Assessment & Plan Note (Signed)
ABIs today are normal at 1.15 on the right and 1.07 on the left with triphasic waveforms and normal digital pressures bilaterally.  Doing much better after left lower extremity revascularization with improvement in rest pain symptoms.  Continue current medical regimen of dual antiplatelet therapy and statin agent.  Recheck in 6 months.

## 2022-12-09 NOTE — Assessment & Plan Note (Signed)
Duplex shows stable mild left carotid stenosis with no significant right carotid stenosis seen.  Continue to follow every other year with duplex.  No change in medications.

## 2022-12-17 LAB — VAS US ABI WITH/WO TBI
Left ABI: 1.07
Right ABI: 1.15

## 2023-02-01 ENCOUNTER — Telehealth: Payer: Self-pay | Admitting: Student in an Organized Health Care Education/Training Program

## 2023-02-01 NOTE — Telephone Encounter (Signed)
Patient informed. 

## 2023-02-01 NOTE — Telephone Encounter (Signed)
Patient will like for Lateef to send a referral to Spine and back pain clinic in [place]. [phone] is the office number.

## 2023-03-25 ENCOUNTER — Telehealth: Payer: Self-pay

## 2023-03-25 ENCOUNTER — Encounter: Payer: Self-pay | Admitting: Urology

## 2023-03-25 ENCOUNTER — Ambulatory Visit: Payer: 59 | Admitting: Urology

## 2023-03-25 ENCOUNTER — Other Ambulatory Visit: Payer: Self-pay

## 2023-03-25 ENCOUNTER — Ambulatory Visit
Admission: RE | Admit: 2023-03-25 | Discharge: 2023-03-25 | Disposition: A | Discharge status: Home / Self Care | Payer: 59 | Attending: Urology | Admitting: Urology

## 2023-03-25 ENCOUNTER — Ambulatory Visit
Admission: RE | Admit: 2023-03-25 | Discharge: 2023-03-25 | Disposition: A | Discharge status: Home / Self Care | Payer: 59 | Source: Ambulatory Visit | Attending: Urology | Admitting: Urology

## 2023-03-25 VITALS — BP 148/82 | HR 87 | Ht 65.0 in | Wt 172.0 lb

## 2023-03-25 DIAGNOSIS — N529 Male erectile dysfunction, unspecified: Secondary | ICD-10-CM

## 2023-03-25 DIAGNOSIS — N200 Calculus of kidney: Secondary | ICD-10-CM

## 2023-03-25 DIAGNOSIS — Z87442 Personal history of urinary calculi: Secondary | ICD-10-CM

## 2023-03-25 DIAGNOSIS — R300 Dysuria: Secondary | ICD-10-CM | POA: Diagnosis not present

## 2023-03-25 DIAGNOSIS — R1010 Upper abdominal pain, unspecified: Secondary | ICD-10-CM

## 2023-03-25 DIAGNOSIS — Z8546 Personal history of malignant neoplasm of prostate: Secondary | ICD-10-CM | POA: Diagnosis not present

## 2023-03-25 DIAGNOSIS — C61 Malignant neoplasm of prostate: Secondary | ICD-10-CM

## 2023-03-25 LAB — URINALYSIS, COMPLETE
Bilirubin, UA: NEGATIVE
Ketones, UA: NEGATIVE
Leukocytes,UA: NEGATIVE
Nitrite, UA: NEGATIVE
Protein,UA: NEGATIVE
RBC, UA: NEGATIVE
Specific Gravity, UA: 1.02 (ref 1.005–1.030)
Urobilinogen, Ur: 0.2 mg/dL (ref 0.2–1.0)
pH, UA: 6 (ref 5.0–7.5)

## 2023-03-25 LAB — MICROSCOPIC EXAMINATION: Bacteria, UA: NONE SEEN

## 2023-03-25 NOTE — Telephone Encounter (Signed)
-----   Message from Sondra Come sent at 03/25/2023  1:41 PM EDT ----- Urine shows no blood or infection, however there is  a lot of sugar from the diabetes which is probably causing his urinary symptoms.  I would also recommend a CT hematuria for further evaluation of his right upper abdominal pain, will call with results  Legrand Rams, MD 03/25/2023

## 2023-03-25 NOTE — Telephone Encounter (Signed)
Called pt informed him of the information below. Pt voiced understanding. CT ordered.

## 2023-03-25 NOTE — Progress Notes (Addendum)
03/25/2023 11:52 AM   Devin Mayer 1956-02-17 [id number]  Reason for visit: Follow up history of prostate cancer, nephrolithiasis, ED, possible right hydronephrosis, right adrenal adenoma  HPI: 67 year old comorbid male who I originally met in November 2023 for the above issues.  He was previously followed by Dr. Sheppard Penton, and was treated in 2015 with brachytherapy for prostate cancer, unclear Gleason score or PSA at that time.  PSA has been undetectable since that time, most recently <0.01 from September 2024.  He has a history of nephrolithiasis, last required ureteroscopy at [facility] in 2021, has not had any stone episodes since that time.  At our original visit he had reported problems with ED and opted for trial of Viagra, that was helpful but at this point he and his wife are no longer sexually active and is not interested in refills.  Today he reports some chronic right upper quadrant pain in the front side that is worse with eating, he denies any flank, low back, or groin pain.  He denies any gross hematuria.  He also reports some dysuria for about a week or 2 with some urgency and frequency.  Urinalysis today is completely benign.  His right adrenal adenoma has been stable in size since at least 2014, no further workup needed.  He underwent an endocrine workup with [facility] endocrinology in 2024 which was benign, I reviewed those outside notes.  I personally viewed and interpreted the most recent CT scan of the abdomen and pelvis with IV contrast from July 2024 from [facility].  There does appear to be some dilation in the right upper pole of the kidney, but no evidence of stones, these could also represent parapelvic cysts.  It would be unique for him to have right upper abdominal pain with eating secondary to this finding on CT, but could consider CT urogram in the future to better evaluate this area.  I also personally reviewed the KUB today that shows no definitive evidence of stones.  -CT urogram  ordered for further evaluation of possible right upper pole hydronephrosis/dilation versus parapelvic cysts of unclear etiology, could potentially be related to his right upper abdominal pain -Reassurance provided regarding undetectable PSA -Reassurance provided regarding stable adrenal adenoma with negative endocrine workup -Consider trial of Flomax for urinary symptoms, UA benign  I spent 45 total minutes on the day of the encounter including pre-visit review of the medical record, face-to-face time with the patient, and post visit ordering of labs/imaging/tests.   Sondra Come, MD  [facility] [address], [address] [place], [place] [address] [phone]

## 2023-03-28 LAB — CULTURE, URINE COMPREHENSIVE

## 2023-04-06 ENCOUNTER — Ambulatory Visit
Admission: RE | Admit: 2023-04-06 | Discharge: 2023-04-06 | Disposition: A | Discharge status: Home / Self Care | Payer: 59 | Source: Ambulatory Visit | Attending: Urology | Admitting: Urology

## 2023-04-06 DIAGNOSIS — D1779 Benign lipomatous neoplasm of other sites: Secondary | ICD-10-CM | POA: Diagnosis not present

## 2023-04-06 DIAGNOSIS — N200 Calculus of kidney: Secondary | ICD-10-CM | POA: Insufficient documentation

## 2023-04-06 DIAGNOSIS — I700 Atherosclerosis of aorta: Secondary | ICD-10-CM | POA: Diagnosis not present

## 2023-04-06 DIAGNOSIS — N281 Cyst of kidney, acquired: Secondary | ICD-10-CM | POA: Insufficient documentation

## 2023-04-06 DIAGNOSIS — R109 Unspecified abdominal pain: Secondary | ICD-10-CM | POA: Diagnosis present

## 2023-04-06 DIAGNOSIS — M795 Residual foreign body in soft tissue: Secondary | ICD-10-CM | POA: Diagnosis not present

## 2023-04-06 DIAGNOSIS — Z87442 Personal history of urinary calculi: Secondary | ICD-10-CM | POA: Diagnosis present

## 2023-04-06 MED ORDER — SODIUM CHLORIDE 0.9 % IV BOLUS
250.0000 mL | Freq: Once | INTRAVENOUS | Status: AC
  Administered 2023-04-06: 250 mL via INTRAVENOUS

## 2023-04-06 MED ORDER — IOHEXOL 300 MG/ML  SOLN
125.0000 mL | Freq: Once | INTRAMUSCULAR | Status: AC | PRN
  Administered 2023-04-06: 125 mL via INTRAVENOUS

## 2023-04-29 ENCOUNTER — Other Ambulatory Visit: Payer: Self-pay | Admitting: Student

## 2023-04-29 DIAGNOSIS — G8929 Other chronic pain: Secondary | ICD-10-CM

## 2023-04-29 DIAGNOSIS — G6289 Other specified polyneuropathies: Secondary | ICD-10-CM

## 2023-04-29 DIAGNOSIS — M5416 Radiculopathy, lumbar region: Secondary | ICD-10-CM

## 2023-04-30 ENCOUNTER — Other Ambulatory Visit: Payer: Self-pay | Admitting: Internal Medicine

## 2023-04-30 DIAGNOSIS — F17210 Nicotine dependence, cigarettes, uncomplicated: Secondary | ICD-10-CM

## 2023-04-30 DIAGNOSIS — G8929 Other chronic pain: Secondary | ICD-10-CM

## 2023-05-08 ENCOUNTER — Ambulatory Visit
Admission: RE | Admit: 2023-05-08 | Discharge: 2023-05-08 | Disposition: A | Discharge status: Home / Self Care | Payer: 59 | Source: Ambulatory Visit | Attending: Student | Admitting: Student

## 2023-05-08 DIAGNOSIS — M5416 Radiculopathy, lumbar region: Secondary | ICD-10-CM

## 2023-05-08 DIAGNOSIS — G6289 Other specified polyneuropathies: Secondary | ICD-10-CM

## 2023-05-08 DIAGNOSIS — G8929 Other chronic pain: Secondary | ICD-10-CM

## 2023-05-12 ENCOUNTER — Ambulatory Visit
Admission: RE | Admit: 2023-05-12 | Discharge: 2023-05-12 | Disposition: A | Discharge status: Home / Self Care | Payer: 59 | Source: Ambulatory Visit | Attending: Internal Medicine | Admitting: Internal Medicine

## 2023-05-12 DIAGNOSIS — F17210 Nicotine dependence, cigarettes, uncomplicated: Secondary | ICD-10-CM | POA: Insufficient documentation

## 2023-05-12 DIAGNOSIS — M25562 Pain in left knee: Secondary | ICD-10-CM | POA: Diagnosis present

## 2023-05-12 DIAGNOSIS — G8929 Other chronic pain: Secondary | ICD-10-CM | POA: Insufficient documentation

## 2023-06-08 ENCOUNTER — Other Ambulatory Visit (INDEPENDENT_AMBULATORY_CARE_PROVIDER_SITE_OTHER): Payer: Self-pay | Admitting: Vascular Surgery

## 2023-06-08 ENCOUNTER — Ambulatory Visit (INDEPENDENT_AMBULATORY_CARE_PROVIDER_SITE_OTHER): Payer: 59 | Admitting: Vascular Surgery

## 2023-06-08 ENCOUNTER — Ambulatory Visit (INDEPENDENT_AMBULATORY_CARE_PROVIDER_SITE_OTHER): Payer: 59

## 2023-06-08 ENCOUNTER — Encounter (INDEPENDENT_AMBULATORY_CARE_PROVIDER_SITE_OTHER): Payer: Self-pay | Admitting: Vascular Surgery

## 2023-06-08 VITALS — BP 124/78 | HR 102 | Resp 18 | Ht 66.0 in | Wt 171.2 lb

## 2023-06-08 DIAGNOSIS — I739 Peripheral vascular disease, unspecified: Secondary | ICD-10-CM

## 2023-06-08 DIAGNOSIS — E1140 Type 2 diabetes mellitus with diabetic neuropathy, unspecified: Secondary | ICD-10-CM

## 2023-06-08 DIAGNOSIS — F17290 Nicotine dependence, other tobacco product, uncomplicated: Secondary | ICD-10-CM

## 2023-06-08 DIAGNOSIS — I10 Essential (primary) hypertension: Secondary | ICD-10-CM

## 2023-06-08 DIAGNOSIS — Z794 Long term (current) use of insulin: Secondary | ICD-10-CM

## 2023-06-08 DIAGNOSIS — F17200 Nicotine dependence, unspecified, uncomplicated: Secondary | ICD-10-CM

## 2023-06-08 DIAGNOSIS — I70222 Atherosclerosis of native arteries of extremities with rest pain, left leg: Secondary | ICD-10-CM

## 2023-06-08 NOTE — H&P (View-Only) (Signed)
 MRN : [id number]  Devin Mayer is a 68 y.o. (1955-08-23) male who presents with chief complaint of  Chief Complaint  Patient presents with   Follow-up    6 month ABI  .  History of Present Illness: Patient returns today in follow up of his PAD.  He underwent extensive revascularization last year and at last check about 6 months ago, his legs were doing reasonably well.  Over the past month or 2 he has developed numbness and pain in the left lower extremity.  This is now waking him from sleep.  This is quite severe and essentially unrelenting.  He can only walk very short distances.  He denies any open wounds or infection currently.  He continues to smoke although he has cut back to less than 1 pack/day.  Noninvasive studies were performed today and his right ABI is normal at 1.0 but his left ABI has dropped down to 0.49 consistent with occlusion/thrombosis of his previous left lower extremity revascularization.  Current Outpatient Medications  Medication Sig Dispense Refill   ACCU-CHEK GUIDE test strip 1 each (1 strip total) 2 (two) times daily Use as instructed.     albuterol  (VENTOLIN  HFA) 108 (90 Base) MCG/ACT inhaler Inhale 2 puffs into the lungs every 6 (six) hours as needed.     aspirin  EC 81 MG tablet Take 1 tablet (81 mg total) by mouth daily. Swallow whole. 150 tablet 2   BD PEN NEEDLE NANO 2ND GEN 32G X 4 MM MISC See admin instructions.     celecoxib  (CELEBREX ) 200 MG capsule Take by mouth.     clonazePAM  (KLONOPIN ) 0.5 MG tablet Take 1 tablet (0.5 mg total) by mouth at bedtime as needed. 10 tablet 5   clopidogrel  (PLAVIX ) 75 MG tablet Take 1 tablet (75 mg total) by mouth daily. 30 tablet 11   Continuous Glucose Receiver (DEXCOM G7 RECEIVER) DEVI USE 1 EACH AS DIRECTED TO MONITOR BLOOD SUGARS DAILY     Continuous Glucose Sensor (DEXCOM G7 SENSOR) MISC USE 1 EACH EVERY 10 (TEN) DAYS     cyclobenzaprine  (FLEXERIL ) 10 MG tablet Take 10 mg by mouth 2 (two) times daily as needed.      diclofenac (VOLTAREN) 50 MG EC tablet Take 50 mg by mouth 3 (three) times daily.     famotidine  (PEPCID ) 20 MG tablet TAKE 1 TABLET (20 MG TOTAL) BY MOUTH TWO (2) TIMES A DAY.     fenofibrate  (TRICOR ) 145 MG tablet Take 145 mg by mouth daily.     gabapentin  (NEURONTIN ) 600 MG tablet Take 600 mg by mouth 3 (three) times daily.     glipiZIDE  (GLUCOTROL  XL) 2.5 MG 24 hr tablet Take 2.5 mg by mouth daily.     hydrochlorothiazide  (HYDRODIURIL ) 25 MG tablet Take 25 mg by mouth daily.     HYDROcodone -acetaminophen  (NORCO/VICODIN) 5-325 MG tablet Take by mouth.     hydrOXYzine (ATARAX) 25 MG tablet Take by mouth.     insulin  glargine (LANTUS  SOLOSTAR) 100 UNIT/ML Solostar Pen Inject 12 Units into the skin daily. 15 mL 5   JANUVIA  100 MG tablet Take 100 mg by mouth daily.     metFORMIN  (GLUCOPHAGE -XR) 500 MG 24 hr tablet TAKE 1 TABLET BY MOUTH DAILY WITH SUPPER. 90 tablet 0   nortriptyline  (PAMELOR ) 10 MG capsule Take 20 mg by mouth at bedtime.     pantoprazole  (PROTONIX ) 40 MG tablet Take 1 tablet (40 mg total) by mouth daily before breakfast. 90 tablet 3  permethrin (ELIMITE) 5 % cream PLEASE SEE ATTACHED FOR DETAILED DIRECTIONS     potassium chloride  SA (KLOR-CON  M) 20 MEQ tablet Take 1 tablet (20 mEq total) by mouth daily. 90 tablet 3   pravastatin  (PRAVACHOL ) 40 MG tablet Take 1 tablet (40 mg total) by mouth daily. 90 tablet 3   pregabalin  (LYRICA ) 75 MG capsule Take 75 mg by mouth 2 (two) times daily.     sildenafil  (VIAGRA ) 100 MG tablet Take 1 tablet (100 mg total) by mouth daily as needed for erectile dysfunction. 30 tablet 3   tiZANidine  (ZANAFLEX ) 4 MG tablet Take by mouth.     triamcinolone  cream (KENALOG ) 0.5 % Apply 1 application topically 2 (two) times daily. To affected areas, for up to 2 weeks. 30 g 2   insulin  glargine-yfgn (SEMGLEE ) 100 UNIT/ML Pen Inject into the skin.     No current facility-administered medications for this visit.    Past Medical History:  Diagnosis Date    Cancer [facility])    COPD (chronic obstructive pulmonary disease) (HCC)    Diabetes mellitus without complication (HCC)    Hyperlipidemia    Hypertension    Stroke [facility])     Past Surgical History:  Procedure Laterality Date   CHOLECYSTECTOMY     LOWER EXTREMITY ANGIOGRAPHY Left 08/03/2022   Procedure: Lower Extremity Angiography;  Surgeon: Marea Selinda RAMAN, MD;  Location: [facility] INVASIVE CV LAB;  Service: Cardiovascular;  Laterality: Left;   SHOULDER SURGERY Bilateral      Social History   Tobacco Use   Smoking status: Every Day    Types: Cigars   Smokeless tobacco: Never  Substance Use Topics   Alcohol use: Yes   Drug use: Never      Family History  Problem Relation Age of Onset   Heart attack Father    Diabetes Father    Heart attack Paternal Uncle    Lung cancer Maternal Grandmother    Lung cancer Paternal Grandmother      Allergies  Allergen Reactions   Latex Rash     REVIEW OF SYSTEMS (Negative unless checked)  Constitutional: [] Weight loss  [] Fever  [] Chills Cardiac: [] Chest pain   [] Chest pressure   [] Palpitations   [] Shortness of breath when laying flat   [] Shortness of breath at rest   [] Shortness of breath with exertion. Vascular:  [x] Pain in legs with walking   [] Pain in legs at rest   [] Pain in legs when laying flat   [] Claudication   [] Pain in feet when walking  [x] Pain in feet at rest  [] Pain in feet when laying flat   [] History of DVT   [] Phlebitis   [x] Swelling in legs   [] Varicose veins   [] Non-healing ulcers Pulmonary:   [] Uses home oxygen   [] Productive cough   [] Hemoptysis   [] Wheeze  [] COPD   [] Asthma Neurologic:  [] Dizziness  [] Blackouts   [] Seizures   [] History of stroke   [] History of TIA  [] Aphasia   [] Temporary blindness   [] Dysphagia   [] Weakness or numbness in arms   [] Weakness or numbness in legs Musculoskeletal:  [] Arthritis   [] Joint swelling   [] Joint pain   [] Low back pain Hematologic:  [] Easy bruising  [] Easy bleeding   [] Hypercoagulable  state   [] Anemic   Gastrointestinal:  [] Blood in stool   [] Vomiting blood  [] Gastroesophageal reflux/heartburn   [] Abdominal pain Genitourinary:  [] Chronic kidney disease   [] Difficult urination  [] Frequent urination  [] Burning with urination   [] Hematuria Skin:  [] Rashes   []   Ulcers   [] Wounds Psychological:  [] History of anxiety   []  History of major depression.  Physical Examination  BP 124/78   Pulse (!) 102   Resp 18   Ht 5\' 6"  (1.676 m)   Wt 171 lb 3.2 oz (77.7 kg)   BMI 27.63 kg/m  Gen:  WD/WN, NAD Head: [place]/AT, No temporalis wasting. Ear/Nose/Throat: Hearing grossly intact, nares w/o erythema or drainage Eyes: Conjunctiva clear. Sclera non-icteric Neck: Supple.  Trachea midline Pulmonary:  Good air movement, no use of accessory muscles.  Cardiac: tachycardic Vascular:  Vessel Right Left  Radial Palpable Palpable                          PT Palpable Not Palpable  DP Palpable Not Palpable   Gastrointestinal: soft, non-tender/non-distended. No guarding/reflex.  Musculoskeletal: M/S 5/5 throughout.  No deformity or atrophy. Trace LLE edema. Neurologic: Sensation grossly intact in extremities.  Symmetrical.  Speech is fluent.  Psychiatric: Judgment intact, Mood & affect appropriate for pt's clinical situation. Dermatologic: No rashes or ulcers noted.  No cellulitis or open wounds.      Labs Recent Results (from the past 2160 hours)  Urinalysis, Complete     Status: Abnormal   Collection Time: 03/25/23 11:13 AM  Result Value Ref Range   Specific Gravity, UA 1.020 1.005 - 1.030   pH, UA 6.0 5.0 - 7.5   Color, UA Yellow Yellow   Appearance Ur Clear Clear   Leukocytes,UA Negative Negative   Protein,UA Negative Negative/Trace   Glucose, UA 2+ (A) Negative   Ketones, UA Negative Negative   RBC, UA Negative Negative   Bilirubin, UA Negative Negative   Urobilinogen, Ur 0.2 0.2 - 1.0 mg/dL   Nitrite, UA Negative Negative   Microscopic Examination See below:    CULTURE, URINE COMPREHENSIVE     Status: None   Collection Time: 03/25/23 11:13 AM   Specimen: Urine   UR  Result Value Ref Range   Urine Culture, Comprehensive Final report    Organism ID, Bacteria Comment     Comment: No growth in 36 - 48 hours.  Microscopic Examination     Status: None   Collection Time: 03/25/23 11:13 AM   Urine  Result Value Ref Range   WBC, UA 0-5 0 - 5 /hpf   RBC, Urine 0-2 0 - 2 /hpf   Epithelial Cells (non renal) 0-10 0 - 10 /hpf   Bacteria, UA None seen None seen/Few    Radiology CT CHEST LUNG CA SCREEN LOW DOSE W/O CM Result Date: 05/23/2023 CLINICAL DATA:  68 year old male smoker with greater than 20 pack-year history of smoking. EXAM: CT CHEST WITHOUT CONTRAST LOW-DOSE FOR LUNG CANCER SCREENING TECHNIQUE: Multidetector CT imaging of the chest was performed following the standard protocol without IV contrast. RADIATION DOSE REDUCTION: This exam was performed according to the departmental dose-optimization program which includes automated exposure control, adjustment of the mA and/or kV according to patient size and/or use of iterative reconstruction technique. COMPARISON:  None Available. FINDINGS: Cardiovascular: Heart size is normal. There is no significant pericardial fluid, thickening or pericardial calcification. There is aortic atherosclerosis, as well as atherosclerosis of the great vessels of the mediastinum and the coronary arteries, including calcified atherosclerotic plaque in the left anterior descending, left circumflex and right coronary arteries. Mediastinum/Nodes: No pathologically enlarged mediastinal or hilar lymph nodes. Please note that accurate exclusion of hilar adenopathy is limited on noncontrast CT scans. Esophagus is unremarkable  in appearance. No axillary lymphadenopathy. Lungs/Pleura: No suspicious appearing pulmonary nodules or masses are noted. No acute consolidative airspace disease. No pleural effusions. Mild diffuse bronchial wall  thickening with mild centrilobular and paraseptal emphysema. Upper Abdomen: Aortic atherosclerosis. 3.6 x 2.9 cm right adrenal mass, similar to prior studies, low-attenuation (-18 HU), compatible with an adenoma. Musculoskeletal: There are no aggressive appearing lytic or blastic lesions noted in the visualized portions of the skeleton. IMPRESSION: 1. Lung-RADS 1S, negative. Continue annual screening with low-dose chest CT without contrast in 12 months. 2. The S modifier above refers to potentially clinically significant non lung cancer related findings. Specifically, there is aortic atherosclerosis, in addition to three-vessel coronary artery disease. Please note that although the presence of coronary artery calcium documents the presence of coronary artery disease, the severity of this disease and any potential stenosis cannot be assessed on this non-gated CT examination. Assessment for potential risk factor modification, dietary therapy or pharmacologic therapy may be warranted, if clinically indicated. 3. Mild diffuse bronchial wall thickening with mild centrilobular and paraseptal emphysema; imaging findings suggestive of underlying COPD. 4. Large right adrenal adenoma again noted. Aortic Atherosclerosis (ICD10-I70.0) and Emphysema (ICD10-J43.9). Electronically Signed   By: Toribio Aye M.D.   On: 05/23/2023 11:03    Assessment/Plan  Hypertension blood pressure control important in reducing the progression of atherosclerotic disease. On appropriate oral medications.   Type 2 diabetes mellitus with diabetic neuropathy, with long-term current use of insulin  (HCC) blood glucose control important in reducing the progression of atherosclerotic disease. Also, involved in wound healing. On appropriate medications.   Smoking We had a discussion for approximately 3-4 minutes regarding the absolute need for smoking cessation due to the deleterious nature of tobacco on the vascular system. We discussed  the tobacco use would diminish patency of any intervention, and likely significantly worsen progression of disease.  We discussed that his interventions or a surgical bypass are unlikely to remain open as long as he continues to smoke, and he has a severe and limb threatening situation with rest pain currently.  We discussed multiple agents for quitting including replacement therapy or medications to reduce cravings such as Chantix. The patient voices their understanding of the importance of smoking cessation.   Atherosclerosis of native arteries of extremity with rest pain (HCC) Noninvasive studies were performed today and his right ABI is normal at 1.0 but his left ABI has dropped down to 0.49 consistent with occlusion/thrombosis of his previous left lower extremity revascularization. This represents a critical and limb threatening situation.  The patient has had occlusion of his previous interventions and these are likely thrombosed.  This is not entirely surprising with his continued tobacco use and we discussed for continued smoking, he will not have durable patency of any intervention or surgery.  I discussed that without improvement in circulation, limb loss would be expected due to his severe and unrelenting pain.  He already seems to have some degree of neuropathic pain from the chronic ischemia so I am concerned that even with revascularization, he is going to have significant neuropathic component to his symptoms.  Smoking cessation is strongly recommended and he and his wife both voiced their understanding of this.  We will get him scheduled for a left lower extremity angiogram as soon as possible.    Selinda Gu, MD  06/08/2023 2:55 PM    This note was created with Dragon medical transcription system.  Any errors from dictation are purely unintentional

## 2023-06-08 NOTE — Assessment & Plan Note (Signed)
 blood glucose control important in reducing the progression of atherosclerotic disease. Also, involved in wound healing. On appropriate medications.

## 2023-06-08 NOTE — Assessment & Plan Note (Signed)
 Noninvasive studies were performed today and his right ABI is normal at 1.0 but his left ABI has dropped down to 0.49 consistent with occlusion/thrombosis of his previous left lower extremity revascularization. This represents a critical and limb threatening situation.  The patient has had occlusion of his previous interventions and these are likely thrombosed.  This is not entirely surprising with his continued tobacco use and we discussed for continued smoking, he will not have durable patency of any intervention or surgery.  I discussed that without improvement in circulation, limb loss would be expected due to his severe and unrelenting pain.  He already seems to have some degree of neuropathic pain from the chronic ischemia so I am concerned that even with revascularization, he is going to have significant neuropathic component to his symptoms.  Smoking cessation is strongly recommended and he and his wife both voiced their understanding of this.  We will get him scheduled for a left lower extremity angiogram as soon as possible.

## 2023-06-08 NOTE — Assessment & Plan Note (Signed)
 blood pressure control important in reducing the progression of atherosclerotic disease. On appropriate oral medications.

## 2023-06-08 NOTE — H&P (View-Only) (Signed)
 MRN : [id number]  Devin Mayer is a 68 y.o. (1955-08-23) male who presents with chief complaint of  Chief Complaint  Patient presents with   Follow-up    6 month ABI  .  History of Present Illness: Patient returns today in follow up of his PAD.  He underwent extensive revascularization last year and at last check about 6 months ago, his legs were doing reasonably well.  Over the past month or 2 he has developed numbness and pain in the left lower extremity.  This is now waking him from sleep.  This is quite severe and essentially unrelenting.  He can only walk very short distances.  He denies any open wounds or infection currently.  He continues to smoke although he has cut back to less than 1 pack/day.  Noninvasive studies were performed today and his right ABI is normal at 1.0 but his left ABI has dropped down to 0.49 consistent with occlusion/thrombosis of his previous left lower extremity revascularization.  Current Outpatient Medications  Medication Sig Dispense Refill   ACCU-CHEK GUIDE test strip 1 each (1 strip total) 2 (two) times daily Use as instructed.     albuterol  (VENTOLIN  HFA) 108 (90 Base) MCG/ACT inhaler Inhale 2 puffs into the lungs every 6 (six) hours as needed.     aspirin  EC 81 MG tablet Take 1 tablet (81 mg total) by mouth daily. Swallow whole. 150 tablet 2   BD PEN NEEDLE NANO 2ND GEN 32G X 4 MM MISC See admin instructions.     celecoxib  (CELEBREX ) 200 MG capsule Take by mouth.     clonazePAM  (KLONOPIN ) 0.5 MG tablet Take 1 tablet (0.5 mg total) by mouth at bedtime as needed. 10 tablet 5   clopidogrel  (PLAVIX ) 75 MG tablet Take 1 tablet (75 mg total) by mouth daily. 30 tablet 11   Continuous Glucose Receiver (DEXCOM G7 RECEIVER) DEVI USE 1 EACH AS DIRECTED TO MONITOR BLOOD SUGARS DAILY     Continuous Glucose Sensor (DEXCOM G7 SENSOR) MISC USE 1 EACH EVERY 10 (TEN) DAYS     cyclobenzaprine  (FLEXERIL ) 10 MG tablet Take 10 mg by mouth 2 (two) times daily as needed.      diclofenac (VOLTAREN) 50 MG EC tablet Take 50 mg by mouth 3 (three) times daily.     famotidine  (PEPCID ) 20 MG tablet TAKE 1 TABLET (20 MG TOTAL) BY MOUTH TWO (2) TIMES A DAY.     fenofibrate  (TRICOR ) 145 MG tablet Take 145 mg by mouth daily.     gabapentin  (NEURONTIN ) 600 MG tablet Take 600 mg by mouth 3 (three) times daily.     glipiZIDE  (GLUCOTROL  XL) 2.5 MG 24 hr tablet Take 2.5 mg by mouth daily.     hydrochlorothiazide  (HYDRODIURIL ) 25 MG tablet Take 25 mg by mouth daily.     HYDROcodone -acetaminophen  (NORCO/VICODIN) 5-325 MG tablet Take by mouth.     hydrOXYzine (ATARAX) 25 MG tablet Take by mouth.     insulin  glargine (LANTUS  SOLOSTAR) 100 UNIT/ML Solostar Pen Inject 12 Units into the skin daily. 15 mL 5   JANUVIA  100 MG tablet Take 100 mg by mouth daily.     metFORMIN  (GLUCOPHAGE -XR) 500 MG 24 hr tablet TAKE 1 TABLET BY MOUTH DAILY WITH SUPPER. 90 tablet 0   nortriptyline  (PAMELOR ) 10 MG capsule Take 20 mg by mouth at bedtime.     pantoprazole  (PROTONIX ) 40 MG tablet Take 1 tablet (40 mg total) by mouth daily before breakfast. 90 tablet 3  permethrin (ELIMITE) 5 % cream PLEASE SEE ATTACHED FOR DETAILED DIRECTIONS     potassium chloride  SA (KLOR-CON  M) 20 MEQ tablet Take 1 tablet (20 mEq total) by mouth daily. 90 tablet 3   pravastatin  (PRAVACHOL ) 40 MG tablet Take 1 tablet (40 mg total) by mouth daily. 90 tablet 3   pregabalin  (LYRICA ) 75 MG capsule Take 75 mg by mouth 2 (two) times daily.     sildenafil  (VIAGRA ) 100 MG tablet Take 1 tablet (100 mg total) by mouth daily as needed for erectile dysfunction. 30 tablet 3   tiZANidine  (ZANAFLEX ) 4 MG tablet Take by mouth.     triamcinolone  cream (KENALOG ) 0.5 % Apply 1 application topically 2 (two) times daily. To affected areas, for up to 2 weeks. 30 g 2   insulin  glargine-yfgn (SEMGLEE ) 100 UNIT/ML Pen Inject into the skin.     No current facility-administered medications for this visit.    Past Medical History:  Diagnosis Date    Cancer [facility])    COPD (chronic obstructive pulmonary disease) (HCC)    Diabetes mellitus without complication (HCC)    Hyperlipidemia    Hypertension    Stroke [facility])     Past Surgical History:  Procedure Laterality Date   CHOLECYSTECTOMY     LOWER EXTREMITY ANGIOGRAPHY Left 08/03/2022   Procedure: Lower Extremity Angiography;  Surgeon: Marea Selinda RAMAN, MD;  Location: [facility] INVASIVE CV LAB;  Service: Cardiovascular;  Laterality: Left;   SHOULDER SURGERY Bilateral      Social History   Tobacco Use   Smoking status: Every Day    Types: Cigars   Smokeless tobacco: Never  Substance Use Topics   Alcohol use: Yes   Drug use: Never      Family History  Problem Relation Age of Onset   Heart attack Father    Diabetes Father    Heart attack Paternal Uncle    Lung cancer Maternal Grandmother    Lung cancer Paternal Grandmother      Allergies  Allergen Reactions   Latex Rash     REVIEW OF SYSTEMS (Negative unless checked)  Constitutional: [] Weight loss  [] Fever  [] Chills Cardiac: [] Chest pain   [] Chest pressure   [] Palpitations   [] Shortness of breath when laying flat   [] Shortness of breath at rest   [] Shortness of breath with exertion. Vascular:  [x] Pain in legs with walking   [] Pain in legs at rest   [] Pain in legs when laying flat   [] Claudication   [] Pain in feet when walking  [x] Pain in feet at rest  [] Pain in feet when laying flat   [] History of DVT   [] Phlebitis   [x] Swelling in legs   [] Varicose veins   [] Non-healing ulcers Pulmonary:   [] Uses home oxygen   [] Productive cough   [] Hemoptysis   [] Wheeze  [] COPD   [] Asthma Neurologic:  [] Dizziness  [] Blackouts   [] Seizures   [] History of stroke   [] History of TIA  [] Aphasia   [] Temporary blindness   [] Dysphagia   [] Weakness or numbness in arms   [] Weakness or numbness in legs Musculoskeletal:  [] Arthritis   [] Joint swelling   [] Joint pain   [] Low back pain Hematologic:  [] Easy bruising  [] Easy bleeding   [] Hypercoagulable  state   [] Anemic   Gastrointestinal:  [] Blood in stool   [] Vomiting blood  [] Gastroesophageal reflux/heartburn   [] Abdominal pain Genitourinary:  [] Chronic kidney disease   [] Difficult urination  [] Frequent urination  [] Burning with urination   [] Hematuria Skin:  [] Rashes   []   Ulcers   [] Wounds Psychological:  [] History of anxiety   []  History of major depression.  Physical Examination  BP 124/78   Pulse (!) 102   Resp 18   Ht 5' 6 (1.676 m)   Wt 171 lb 3.2 oz (77.7 kg)   BMI 27.63 kg/m  Gen:  WD/WN, NAD Head: [place]/AT, No temporalis wasting. Ear/Nose/Throat: Hearing grossly intact, nares w/o erythema or drainage Eyes: Conjunctiva clear. Sclera non-icteric Neck: Supple.  Trachea midline Pulmonary:  Good air movement, no use of accessory muscles.  Cardiac: tachycardic Vascular:  Vessel Right Left  Radial Palpable Palpable                          PT Palpable Not Palpable  DP Palpable Not Palpable   Gastrointestinal: soft, non-tender/non-distended. No guarding/reflex.  Musculoskeletal: M/S 5/5 throughout.  No deformity or atrophy. Trace LLE edema. Neurologic: Sensation grossly intact in extremities.  Symmetrical.  Speech is fluent.  Psychiatric: Judgment intact, Mood & affect appropriate for pt's clinical situation. Dermatologic: No rashes or ulcers noted.  No cellulitis or open wounds.      Labs Recent Results (from the past 2160 hours)  Urinalysis, Complete     Status: Abnormal   Collection Time: 03/25/23 11:13 AM  Result Value Ref Range   Specific Gravity, UA 1.020 1.005 - 1.030   pH, UA 6.0 5.0 - 7.5   Color, UA Yellow Yellow   Appearance Ur Clear Clear   Leukocytes,UA Negative Negative   Protein,UA Negative Negative/Trace   Glucose, UA 2+ (A) Negative   Ketones, UA Negative Negative   RBC, UA Negative Negative   Bilirubin, UA Negative Negative   Urobilinogen, Ur 0.2 0.2 - 1.0 mg/dL   Nitrite, UA Negative Negative   Microscopic Examination See below:    CULTURE, URINE COMPREHENSIVE     Status: None   Collection Time: 03/25/23 11:13 AM   Specimen: Urine   UR  Result Value Ref Range   Urine Culture, Comprehensive Final report    Organism ID, Bacteria Comment     Comment: No growth in 36 - 48 hours.  Microscopic Examination     Status: None   Collection Time: 03/25/23 11:13 AM   Urine  Result Value Ref Range   WBC, UA 0-5 0 - 5 /hpf   RBC, Urine 0-2 0 - 2 /hpf   Epithelial Cells (non renal) 0-10 0 - 10 /hpf   Bacteria, UA None seen None seen/Few    Radiology CT CHEST LUNG CA SCREEN LOW DOSE W/O CM Result Date: 05/23/2023 CLINICAL DATA:  68 year old male smoker with greater than 20 pack-year history of smoking. EXAM: CT CHEST WITHOUT CONTRAST LOW-DOSE FOR LUNG CANCER SCREENING TECHNIQUE: Multidetector CT imaging of the chest was performed following the standard protocol without IV contrast. RADIATION DOSE REDUCTION: This exam was performed according to the departmental dose-optimization program which includes automated exposure control, adjustment of the mA and/or kV according to patient size and/or use of iterative reconstruction technique. COMPARISON:  None Available. FINDINGS: Cardiovascular: Heart size is normal. There is no significant pericardial fluid, thickening or pericardial calcification. There is aortic atherosclerosis, as well as atherosclerosis of the great vessels of the mediastinum and the coronary arteries, including calcified atherosclerotic plaque in the left anterior descending, left circumflex and right coronary arteries. Mediastinum/Nodes: No pathologically enlarged mediastinal or hilar lymph nodes. Please note that accurate exclusion of hilar adenopathy is limited on noncontrast CT scans. Esophagus is unremarkable  in appearance. No axillary lymphadenopathy. Lungs/Pleura: No suspicious appearing pulmonary nodules or masses are noted. No acute consolidative airspace disease. No pleural effusions. Mild diffuse bronchial wall  thickening with mild centrilobular and paraseptal emphysema. Upper Abdomen: Aortic atherosclerosis. 3.6 x 2.9 cm right adrenal mass, similar to prior studies, low-attenuation (-18 HU), compatible with an adenoma. Musculoskeletal: There are no aggressive appearing lytic or blastic lesions noted in the visualized portions of the skeleton. IMPRESSION: 1. Lung-RADS 1S, negative. Continue annual screening with low-dose chest CT without contrast in 12 months. 2. The S modifier above refers to potentially clinically significant non lung cancer related findings. Specifically, there is aortic atherosclerosis, in addition to three-vessel coronary artery disease. Please note that although the presence of coronary artery calcium documents the presence of coronary artery disease, the severity of this disease and any potential stenosis cannot be assessed on this non-gated CT examination. Assessment for potential risk factor modification, dietary therapy or pharmacologic therapy may be warranted, if clinically indicated. 3. Mild diffuse bronchial wall thickening with mild centrilobular and paraseptal emphysema; imaging findings suggestive of underlying COPD. 4. Large right adrenal adenoma again noted. Aortic Atherosclerosis (ICD10-I70.0) and Emphysema (ICD10-J43.9). Electronically Signed   By: Toribio Aye M.D.   On: 05/23/2023 11:03    Assessment/Plan  Hypertension blood pressure control important in reducing the progression of atherosclerotic disease. On appropriate oral medications.   Type 2 diabetes mellitus with diabetic neuropathy, with long-term current use of insulin  (HCC) blood glucose control important in reducing the progression of atherosclerotic disease. Also, involved in wound healing. On appropriate medications.   Smoking We had a discussion for approximately 3-4 minutes regarding the absolute need for smoking cessation due to the deleterious nature of tobacco on the vascular system. We discussed  the tobacco use would diminish patency of any intervention, and likely significantly worsen progression of disease.  We discussed that his interventions or a surgical bypass are unlikely to remain open as long as he continues to smoke, and he has a severe and limb threatening situation with rest pain currently.  We discussed multiple agents for quitting including replacement therapy or medications to reduce cravings such as Chantix. The patient voices their understanding of the importance of smoking cessation.   Atherosclerosis of native arteries of extremity with rest pain (HCC) Noninvasive studies were performed today and his right ABI is normal at 1.0 but his left ABI has dropped down to 0.49 consistent with occlusion/thrombosis of his previous left lower extremity revascularization. This represents a critical and limb threatening situation.  The patient has had occlusion of his previous interventions and these are likely thrombosed.  This is not entirely surprising with his continued tobacco use and we discussed for continued smoking, he will not have durable patency of any intervention or surgery.  I discussed that without improvement in circulation, limb loss would be expected due to his severe and unrelenting pain.  He already seems to have some degree of neuropathic pain from the chronic ischemia so I am concerned that even with revascularization, he is going to have significant neuropathic component to his symptoms.  Smoking cessation is strongly recommended and he and his wife both voiced their understanding of this.  We will get him scheduled for a left lower extremity angiogram as soon as possible.    Selinda Gu, MD  06/08/2023 2:55 PM    This note was created with Dragon medical transcription system.  Any errors from dictation are purely unintentional

## 2023-06-08 NOTE — Assessment & Plan Note (Signed)
 We had a discussion for approximately 3-4 minutes regarding the absolute need for smoking cessation due to the deleterious nature of tobacco on the vascular system. We discussed the tobacco use would diminish patency of any intervention, and likely significantly worsen progression of disease.  We discussed that his interventions or a surgical bypass are unlikely to remain open as long as he continues to smoke, and he has a severe and limb threatening situation with rest pain currently.  We discussed multiple agents for quitting including replacement therapy or medications to reduce cravings such as Chantix. The patient voices their understanding of the importance of smoking cessation.

## 2023-06-08 NOTE — Progress Notes (Signed)
 MRN : [id number]  Devin Mayer is a 68 y.o. (1955-08-23) male who presents with chief complaint of  Chief Complaint  Patient presents with   Follow-up    6 month ABI  .  History of Present Illness: Patient returns today in follow up of his PAD.  He underwent extensive revascularization last year and at last check about 6 months ago, his legs were doing reasonably well.  Over the past month or 2 he has developed numbness and pain in the left lower extremity.  This is now waking him from sleep.  This is quite severe and essentially unrelenting.  He can only walk very short distances.  He denies any open wounds or infection currently.  He continues to smoke although he has cut back to less than 1 pack/day.  Noninvasive studies were performed today and his right ABI is normal at 1.0 but his left ABI has dropped down to 0.49 consistent with occlusion/thrombosis of his previous left lower extremity revascularization.  Current Outpatient Medications  Medication Sig Dispense Refill   ACCU-CHEK GUIDE test strip 1 each (1 strip total) 2 (two) times daily Use as instructed.     albuterol  (VENTOLIN  HFA) 108 (90 Base) MCG/ACT inhaler Inhale 2 puffs into the lungs every 6 (six) hours as needed.     aspirin  EC 81 MG tablet Take 1 tablet (81 mg total) by mouth daily. Swallow whole. 150 tablet 2   BD PEN NEEDLE NANO 2ND GEN 32G X 4 MM MISC See admin instructions.     celecoxib  (CELEBREX ) 200 MG capsule Take by mouth.     clonazePAM  (KLONOPIN ) 0.5 MG tablet Take 1 tablet (0.5 mg total) by mouth at bedtime as needed. 10 tablet 5   clopidogrel  (PLAVIX ) 75 MG tablet Take 1 tablet (75 mg total) by mouth daily. 30 tablet 11   Continuous Glucose Receiver (DEXCOM G7 RECEIVER) DEVI USE 1 EACH AS DIRECTED TO MONITOR BLOOD SUGARS DAILY     Continuous Glucose Sensor (DEXCOM G7 SENSOR) MISC USE 1 EACH EVERY 10 (TEN) DAYS     cyclobenzaprine  (FLEXERIL ) 10 MG tablet Take 10 mg by mouth 2 (two) times daily as needed.      diclofenac (VOLTAREN) 50 MG EC tablet Take 50 mg by mouth 3 (three) times daily.     famotidine  (PEPCID ) 20 MG tablet TAKE 1 TABLET (20 MG TOTAL) BY MOUTH TWO (2) TIMES A DAY.     fenofibrate  (TRICOR ) 145 MG tablet Take 145 mg by mouth daily.     gabapentin  (NEURONTIN ) 600 MG tablet Take 600 mg by mouth 3 (three) times daily.     glipiZIDE  (GLUCOTROL  XL) 2.5 MG 24 hr tablet Take 2.5 mg by mouth daily.     hydrochlorothiazide  (HYDRODIURIL ) 25 MG tablet Take 25 mg by mouth daily.     HYDROcodone -acetaminophen  (NORCO/VICODIN) 5-325 MG tablet Take by mouth.     hydrOXYzine (ATARAX) 25 MG tablet Take by mouth.     insulin  glargine (LANTUS  SOLOSTAR) 100 UNIT/ML Solostar Pen Inject 12 Units into the skin daily. 15 mL 5   JANUVIA  100 MG tablet Take 100 mg by mouth daily.     metFORMIN  (GLUCOPHAGE -XR) 500 MG 24 hr tablet TAKE 1 TABLET BY MOUTH DAILY WITH SUPPER. 90 tablet 0   nortriptyline  (PAMELOR ) 10 MG capsule Take 20 mg by mouth at bedtime.     pantoprazole  (PROTONIX ) 40 MG tablet Take 1 tablet (40 mg total) by mouth daily before breakfast. 90 tablet 3  permethrin (ELIMITE) 5 % cream PLEASE SEE ATTACHED FOR DETAILED DIRECTIONS     potassium chloride  SA (KLOR-CON  M) 20 MEQ tablet Take 1 tablet (20 mEq total) by mouth daily. 90 tablet 3   pravastatin  (PRAVACHOL ) 40 MG tablet Take 1 tablet (40 mg total) by mouth daily. 90 tablet 3   pregabalin  (LYRICA ) 75 MG capsule Take 75 mg by mouth 2 (two) times daily.     sildenafil  (VIAGRA ) 100 MG tablet Take 1 tablet (100 mg total) by mouth daily as needed for erectile dysfunction. 30 tablet 3   tiZANidine  (ZANAFLEX ) 4 MG tablet Take by mouth.     triamcinolone  cream (KENALOG ) 0.5 % Apply 1 application topically 2 (two) times daily. To affected areas, for up to 2 weeks. 30 g 2   insulin  glargine-yfgn (SEMGLEE ) 100 UNIT/ML Pen Inject into the skin.     No current facility-administered medications for this visit.    Past Medical History:  Diagnosis Date    Cancer [facility])    COPD (chronic obstructive pulmonary disease) (HCC)    Diabetes mellitus without complication (HCC)    Hyperlipidemia    Hypertension    Stroke [facility])     Past Surgical History:  Procedure Laterality Date   CHOLECYSTECTOMY     LOWER EXTREMITY ANGIOGRAPHY Left 08/03/2022   Procedure: Lower Extremity Angiography;  Surgeon: Marea Selinda RAMAN, MD;  Location: [facility] INVASIVE CV LAB;  Service: Cardiovascular;  Laterality: Left;   SHOULDER SURGERY Bilateral      Social History   Tobacco Use   Smoking status: Every Day    Types: Cigars   Smokeless tobacco: Never  Substance Use Topics   Alcohol use: Yes   Drug use: Never      Family History  Problem Relation Age of Onset   Heart attack Father    Diabetes Father    Heart attack Paternal Uncle    Lung cancer Maternal Grandmother    Lung cancer Paternal Grandmother      Allergies  Allergen Reactions   Latex Rash     REVIEW OF SYSTEMS (Negative unless checked)  Constitutional: [] Weight loss  [] Fever  [] Chills Cardiac: [] Chest pain   [] Chest pressure   [] Palpitations   [] Shortness of breath when laying flat   [] Shortness of breath at rest   [] Shortness of breath with exertion. Vascular:  [x] Pain in legs with walking   [] Pain in legs at rest   [] Pain in legs when laying flat   [] Claudication   [] Pain in feet when walking  [x] Pain in feet at rest  [] Pain in feet when laying flat   [] History of DVT   [] Phlebitis   [x] Swelling in legs   [] Varicose veins   [] Non-healing ulcers Pulmonary:   [] Uses home oxygen   [] Productive cough   [] Hemoptysis   [] Wheeze  [] COPD   [] Asthma Neurologic:  [] Dizziness  [] Blackouts   [] Seizures   [] History of stroke   [] History of TIA  [] Aphasia   [] Temporary blindness   [] Dysphagia   [] Weakness or numbness in arms   [] Weakness or numbness in legs Musculoskeletal:  [] Arthritis   [] Joint swelling   [] Joint pain   [] Low back pain Hematologic:  [] Easy bruising  [] Easy bleeding   [] Hypercoagulable  state   [] Anemic   Gastrointestinal:  [] Blood in stool   [] Vomiting blood  [] Gastroesophageal reflux/heartburn   [] Abdominal pain Genitourinary:  [] Chronic kidney disease   [] Difficult urination  [] Frequent urination  [] Burning with urination   [] Hematuria Skin:  [] Rashes   []   Ulcers   [] Wounds Psychological:  [] History of anxiety   []  History of major depression.  Physical Examination  BP 124/78   Pulse (!) 102   Resp 18   Ht 5' 6 (1.676 m)   Wt 171 lb 3.2 oz (77.7 kg)   BMI 27.63 kg/m  Gen:  WD/WN, NAD Head: [place]/AT, No temporalis wasting. Ear/Nose/Throat: Hearing grossly intact, nares w/o erythema or drainage Eyes: Conjunctiva clear. Sclera non-icteric Neck: Supple.  Trachea midline Pulmonary:  Good air movement, no use of accessory muscles.  Cardiac: tachycardic Vascular:  Vessel Right Left  Radial Palpable Palpable                          PT Palpable Not Palpable  DP Palpable Not Palpable   Gastrointestinal: soft, non-tender/non-distended. No guarding/reflex.  Musculoskeletal: M/S 5/5 throughout.  No deformity or atrophy. Trace LLE edema. Neurologic: Sensation grossly intact in extremities.  Symmetrical.  Speech is fluent.  Psychiatric: Judgment intact, Mood & affect appropriate for pt's clinical situation. Dermatologic: No rashes or ulcers noted.  No cellulitis or open wounds.      Labs Recent Results (from the past 2160 hours)  Urinalysis, Complete     Status: Abnormal   Collection Time: 03/25/23 11:13 AM  Result Value Ref Range   Specific Gravity, UA 1.020 1.005 - 1.030   pH, UA 6.0 5.0 - 7.5   Color, UA Yellow Yellow   Appearance Ur Clear Clear   Leukocytes,UA Negative Negative   Protein,UA Negative Negative/Trace   Glucose, UA 2+ (A) Negative   Ketones, UA Negative Negative   RBC, UA Negative Negative   Bilirubin, UA Negative Negative   Urobilinogen, Ur 0.2 0.2 - 1.0 mg/dL   Nitrite, UA Negative Negative   Microscopic Examination See below:    CULTURE, URINE COMPREHENSIVE     Status: None   Collection Time: 03/25/23 11:13 AM   Specimen: Urine   UR  Result Value Ref Range   Urine Culture, Comprehensive Final report    Organism ID, Bacteria Comment     Comment: No growth in 36 - 48 hours.  Microscopic Examination     Status: None   Collection Time: 03/25/23 11:13 AM   Urine  Result Value Ref Range   WBC, UA 0-5 0 - 5 /hpf   RBC, Urine 0-2 0 - 2 /hpf   Epithelial Cells (non renal) 0-10 0 - 10 /hpf   Bacteria, UA None seen None seen/Few    Radiology CT CHEST LUNG CA SCREEN LOW DOSE W/O CM Result Date: 05/23/2023 CLINICAL DATA:  68 year old male smoker with greater than 20 pack-year history of smoking. EXAM: CT CHEST WITHOUT CONTRAST LOW-DOSE FOR LUNG CANCER SCREENING TECHNIQUE: Multidetector CT imaging of the chest was performed following the standard protocol without IV contrast. RADIATION DOSE REDUCTION: This exam was performed according to the departmental dose-optimization program which includes automated exposure control, adjustment of the mA and/or kV according to patient size and/or use of iterative reconstruction technique. COMPARISON:  None Available. FINDINGS: Cardiovascular: Heart size is normal. There is no significant pericardial fluid, thickening or pericardial calcification. There is aortic atherosclerosis, as well as atherosclerosis of the great vessels of the mediastinum and the coronary arteries, including calcified atherosclerotic plaque in the left anterior descending, left circumflex and right coronary arteries. Mediastinum/Nodes: No pathologically enlarged mediastinal or hilar lymph nodes. Please note that accurate exclusion of hilar adenopathy is limited on noncontrast CT scans. Esophagus is unremarkable  in appearance. No axillary lymphadenopathy. Lungs/Pleura: No suspicious appearing pulmonary nodules or masses are noted. No acute consolidative airspace disease. No pleural effusions. Mild diffuse bronchial wall  thickening with mild centrilobular and paraseptal emphysema. Upper Abdomen: Aortic atherosclerosis. 3.6 x 2.9 cm right adrenal mass, similar to prior studies, low-attenuation (-18 HU), compatible with an adenoma. Musculoskeletal: There are no aggressive appearing lytic or blastic lesions noted in the visualized portions of the skeleton. IMPRESSION: 1. Lung-RADS 1S, negative. Continue annual screening with low-dose chest CT without contrast in 12 months. 2. The S modifier above refers to potentially clinically significant non lung cancer related findings. Specifically, there is aortic atherosclerosis, in addition to three-vessel coronary artery disease. Please note that although the presence of coronary artery calcium documents the presence of coronary artery disease, the severity of this disease and any potential stenosis cannot be assessed on this non-gated CT examination. Assessment for potential risk factor modification, dietary therapy or pharmacologic therapy may be warranted, if clinically indicated. 3. Mild diffuse bronchial wall thickening with mild centrilobular and paraseptal emphysema; imaging findings suggestive of underlying COPD. 4. Large right adrenal adenoma again noted. Aortic Atherosclerosis (ICD10-I70.0) and Emphysema (ICD10-J43.9). Electronically Signed   By: Toribio Aye M.D.   On: 05/23/2023 11:03    Assessment/Plan  Hypertension blood pressure control important in reducing the progression of atherosclerotic disease. On appropriate oral medications.   Type 2 diabetes mellitus with diabetic neuropathy, with long-term current use of insulin  (HCC) blood glucose control important in reducing the progression of atherosclerotic disease. Also, involved in wound healing. On appropriate medications.   Smoking We had a discussion for approximately 3-4 minutes regarding the absolute need for smoking cessation due to the deleterious nature of tobacco on the vascular system. We discussed  the tobacco use would diminish patency of any intervention, and likely significantly worsen progression of disease.  We discussed that his interventions or a surgical bypass are unlikely to remain open as long as he continues to smoke, and he has a severe and limb threatening situation with rest pain currently.  We discussed multiple agents for quitting including replacement therapy or medications to reduce cravings such as Chantix. The patient voices their understanding of the importance of smoking cessation.   Atherosclerosis of native arteries of extremity with rest pain (HCC) Noninvasive studies were performed today and his right ABI is normal at 1.0 but his left ABI has dropped down to 0.49 consistent with occlusion/thrombosis of his previous left lower extremity revascularization. This represents a critical and limb threatening situation.  The patient has had occlusion of his previous interventions and these are likely thrombosed.  This is not entirely surprising with his continued tobacco use and we discussed for continued smoking, he will not have durable patency of any intervention or surgery.  I discussed that without improvement in circulation, limb loss would be expected due to his severe and unrelenting pain.  He already seems to have some degree of neuropathic pain from the chronic ischemia so I am concerned that even with revascularization, he is going to have significant neuropathic component to his symptoms.  Smoking cessation is strongly recommended and he and his wife both voiced their understanding of this.  We will get him scheduled for a left lower extremity angiogram as soon as possible.    Selinda Gu, MD  06/08/2023 2:55 PM    This note was created with Dragon medical transcription system.  Any errors from dictation are purely unintentional

## 2023-06-09 ENCOUNTER — Telehealth (INDEPENDENT_AMBULATORY_CARE_PROVIDER_SITE_OTHER): Payer: Self-pay

## 2023-06-09 LAB — VAS US ABI WITH/WO TBI
Left ABI: 0.49
Right ABI: 1.07

## 2023-06-09 NOTE — Telephone Encounter (Signed)
 Spoke with the patient and he is scheduled with Dr. Vonna Guardian on 06/14/23 with a 11:00 am arrival time to the [facility] for a left leg angio. Pre-procedure instructions were discussed and will be sent to Mychart and mailed.

## 2023-06-14 ENCOUNTER — Other Ambulatory Visit: Payer: Self-pay

## 2023-06-14 ENCOUNTER — Encounter: Payer: Self-pay | Admitting: Vascular Surgery

## 2023-06-14 ENCOUNTER — Encounter: Payer: Self-pay | Admitting: Internal Medicine

## 2023-06-14 ENCOUNTER — Encounter
Admission: RE | Disposition: A | Discharge status: Home / Self Care | Payer: Self-pay | Source: Home / Self Care | Attending: Vascular Surgery

## 2023-06-14 ENCOUNTER — Ambulatory Visit
Admission: RE | Admit: 2023-06-14 | Discharge: 2023-06-14 | Disposition: A | Discharge status: Home / Self Care | Payer: 59 | Attending: Vascular Surgery | Admitting: Vascular Surgery

## 2023-06-14 DIAGNOSIS — Z9889 Other specified postprocedural states: Secondary | ICD-10-CM | POA: Diagnosis not present

## 2023-06-14 DIAGNOSIS — Z7984 Long term (current) use of oral hypoglycemic drugs: Secondary | ICD-10-CM | POA: Insufficient documentation

## 2023-06-14 DIAGNOSIS — F17210 Nicotine dependence, cigarettes, uncomplicated: Secondary | ICD-10-CM | POA: Insufficient documentation

## 2023-06-14 DIAGNOSIS — Z79899 Other long term (current) drug therapy: Secondary | ICD-10-CM | POA: Insufficient documentation

## 2023-06-14 DIAGNOSIS — Z794 Long term (current) use of insulin: Secondary | ICD-10-CM | POA: Diagnosis not present

## 2023-06-14 DIAGNOSIS — I70201 Unspecified atherosclerosis of native arteries of extremities, right leg: Secondary | ICD-10-CM

## 2023-06-14 DIAGNOSIS — E1151 Type 2 diabetes mellitus with diabetic peripheral angiopathy without gangrene: Secondary | ICD-10-CM | POA: Diagnosis present

## 2023-06-14 DIAGNOSIS — Z95828 Presence of other vascular implants and grafts: Secondary | ICD-10-CM

## 2023-06-14 DIAGNOSIS — I70222 Atherosclerosis of native arteries of extremities with rest pain, left leg: Secondary | ICD-10-CM | POA: Diagnosis not present

## 2023-06-14 DIAGNOSIS — I70229 Atherosclerosis of native arteries of extremities with rest pain, unspecified extremity: Secondary | ICD-10-CM

## 2023-06-14 DIAGNOSIS — I708 Atherosclerosis of other arteries: Secondary | ICD-10-CM | POA: Diagnosis not present

## 2023-06-14 DIAGNOSIS — I10 Essential (primary) hypertension: Secondary | ICD-10-CM | POA: Insufficient documentation

## 2023-06-14 HISTORY — PX: LOWER EXTREMITY ANGIOGRAPHY: CATH118251

## 2023-06-14 LAB — GLUCOSE, CAPILLARY: Glucose-Capillary: 189 mg/dL — ABNORMAL HIGH (ref 70–99)

## 2023-06-14 LAB — BUN: BUN: 21 mg/dL (ref 8–23)

## 2023-06-14 LAB — CREATININE, SERUM
Creatinine, Ser: 1.47 mg/dL — ABNORMAL HIGH (ref 0.61–1.24)
GFR, Estimated: 52 mL/min — ABNORMAL LOW (ref >60)

## 2023-06-14 SURGERY — LOWER EXTREMITY ANGIOGRAPHY
Anesthesia: Moderate Sedation | Site: Leg Lower | Laterality: Left

## 2023-06-14 MED ORDER — SODIUM CHLORIDE 0.9 % IV SOLN: INTRAVENOUS | Status: DC

## 2023-06-14 MED ORDER — HYDROCODONE-ACETAMINOPHEN 5-325 MG PO TABS: 2.0000 | ORAL_TABLET | Freq: Four times a day (QID) | ORAL | 0 refills | Status: DC | PRN

## 2023-06-14 MED ORDER — HYDROMORPHONE HCL 1 MG/ML IJ SOLN: 1.0000 mg | Freq: Once | INTRAMUSCULAR | Status: DC | PRN

## 2023-06-14 MED ORDER — METHYLPREDNISOLONE SODIUM SUCC 125 MG IJ SOLR: 125.0000 mg | Freq: Once | INTRAMUSCULAR | Status: DC | PRN

## 2023-06-14 MED ORDER — MIDAZOLAM HCL 2 MG/ML PO SYRP: 8.0000 mg | ORAL_SOLUTION | Freq: Once | ORAL | Status: DC | PRN

## 2023-06-14 MED ORDER — MIDAZOLAM HCL 2 MG/2ML IJ SOLN
INTRAMUSCULAR | Status: DC | PRN
  Administered 2023-06-14: 2 mg via INTRAVENOUS

## 2023-06-14 MED ORDER — FENTANYL CITRATE (PF) 100 MCG/2ML IJ SOLN
INTRAMUSCULAR | Status: DC | PRN
  Administered 2023-06-14: 50 ug via INTRAVENOUS

## 2023-06-14 MED ORDER — ONDANSETRON HCL 4 MG/2ML IJ SOLN: 4.0000 mg | Freq: Four times a day (QID) | INTRAMUSCULAR | Status: DC | PRN

## 2023-06-14 MED ORDER — FAMOTIDINE 20 MG PO TABS: 40.0000 mg | ORAL_TABLET | Freq: Once | ORAL | Status: DC | PRN

## 2023-06-14 MED ORDER — HEPARIN SODIUM (PORCINE) 1000 UNIT/ML IJ SOLN
INTRAMUSCULAR | Status: AC
  Filled 2023-06-14: qty 10

## 2023-06-14 MED ORDER — HEPARIN (PORCINE) IN NACL 1000-0.9 UT/500ML-% IV SOLN
INTRAVENOUS | Status: DC | PRN
  Administered 2023-06-14 (×2): 1000 mL

## 2023-06-14 MED ORDER — FENTANYL CITRATE (PF) 100 MCG/2ML IJ SOLN
INTRAMUSCULAR | Status: AC
  Filled 2023-06-14: qty 2

## 2023-06-14 MED ORDER — CEFAZOLIN SODIUM-DEXTROSE 2-4 GM/100ML-% IV SOLN
2.0000 g | INTRAVENOUS | Status: AC
  Administered 2023-06-14: 2 g via INTRAVENOUS

## 2023-06-14 MED ORDER — LIDOCAINE-EPINEPHRINE (PF) 1 %-1:200000 IJ SOLN
INTRAMUSCULAR | Status: DC | PRN
  Administered 2023-06-14 (×2): 10 mL

## 2023-06-14 MED ORDER — DIPHENHYDRAMINE HCL 50 MG/ML IJ SOLN: 50.0000 mg | Freq: Once | INTRAMUSCULAR | Status: DC | PRN

## 2023-06-14 MED ORDER — IODIXANOL 320 MG/ML IV SOLN
INTRAVENOUS | Status: DC | PRN
  Administered 2023-06-14: 45 mL via INTRA_ARTERIAL

## 2023-06-14 MED ORDER — MIDAZOLAM HCL 5 MG/5ML IJ SOLN
INTRAMUSCULAR | Status: AC
  Filled 2023-06-14: qty 5

## 2023-06-14 SURGICAL SUPPLY — 8 items
CATH ANGIO 5F PIGTAIL 65CM (CATHETERS) IMPLANT
COVER PROBE ULTRASOUND 5X96 (MISCELLANEOUS) IMPLANT
DEVICE STARCLOSE SE CLOSURE (Vascular Products) IMPLANT
PACK ANGIOGRAPHY (CUSTOM PROCEDURE TRAY) ×1 IMPLANT
SHEATH BRITE TIP 5FRX11 (SHEATH) IMPLANT
SYR MEDRAD MARK 7 150ML (SYRINGE) IMPLANT
TUBING CONTRAST HIGH PRESS 72 (TUBING) IMPLANT
WIRE GUIDERIGHT .035X150 (WIRE) IMPLANT

## 2023-06-14 NOTE — Interval H&P Note (Signed)
History and Physical Interval Note:  06/14/2023 10:56 AM  Devin Mayer  has presented today for surgery, with the diagnosis of LLE Angio    ASO w est pain.  The various methods of treatment have been discussed with the patient and family. After consideration of risks, benefits and other options for treatment, the patient has consented to  Procedure(s): Lower Extremity Angiography (Left) as a surgical intervention.  The patient's history has been reviewed, patient examined, no change in status, stable for surgery.  I have reviewed the patient's chart and labs.  Questions were answered to the patient's satisfaction.     Festus Barren

## 2023-06-14 NOTE — Op Note (Signed)
[facility]  Percutaneous Study/Intervention Procedural Note   Date of Surgery: 06/14/2023  Surgeon(s):Keynan Heffern    Assistants:none  Pre-operative Diagnosis: PAD with rest pain LLE  Post-operative diagnosis:  Same  Procedure(s) Performed:             1.  Ultrasound guidance for vascular access right femoral artery             2.  Catheter placement into aorta from right femoral approach             3.  Aortogram and selective left lower extremity angiogram             4.  StarClose closure device right femoral artery  EBL: 5 cc  Contrast: 45 cc  Fluoro Time: 0.7 minutes  Moderate Conscious Sedation Time: approximately 20 minutes using 2 mg of Versed and 50 mcg of Fentanyl              Indications:  Patient is a 68 y.o.male with a known history of severe peripheral arterial disease now with rest pain of the left lower extremity. The patient has noninvasive study showing markedly reduced perfusion consistent with thrombosis and occlusion of his previous interventions. The patient is brought in for angiography for further evaluation and potential treatment.  Due to the limb threatening nature of the situation, angiogram was performed for attempted limb salvage. The patient is aware that if the procedure fails, amputation would be expected.  The patient also understands that even with successful revascularization, amputation may still be required due to the severity of the situation.  Risks and benefits are discussed and informed consent is obtained.   Procedure:  The patient was identified and appropriate procedural time out was performed.  The patient was then placed supine on the table and prepped and draped in the usual sterile fashion. Moderate conscious sedation was administered during a face to face encounter with the patient throughout the procedure with my supervision of the RN administering medicines and monitoring the patient's vital signs, pulse oximetry,  telemetry and mental status throughout from the start of the procedure until the patient was taken to the recovery room. Ultrasound was used to evaluate the right common femoral artery.  It was patent .  A digital ultrasound image was acquired.  A Seldinger needle was used to access the right common femoral artery under direct ultrasound guidance and a permanent image was performed.  A 0.035 J wire was advanced without resistance and a 5Fr sheath was placed.  Pigtail catheter was placed into the aorta and an AP aortogram was performed. This demonstrated normal renal arteries and normal aorta.  The right common iliac artery was irregular and appeared to have mild to moderate stenosis in the 50 to 60% range.  The right external iliac artery was patent.  The right hypogastric artery was significantly diseased.  The left common, internal, and external iliac arteries were all occluded.  The previously placed stents went down into the left common femoral artery.  There was occlusion down through the left common femoral artery with reconstitution of the femoral bifurcation.  I pulled the pigtail catheter down to help see this better with pelvic obliques and then elected to evaluate the left lower extremity through this pigtail catheter image.  Selective left lower extremity angiogram was then performed. This demonstrated occlusion of the common femoral artery with reconstitution of the profunda and proximal SFA near their origins.  The SFA, popliteal artery, and tibial  vessels all appear to be widely patent although opacification was reasonably slow distally due to the inflow occlusion.  This will need surgical therapy with a left femoral endarterectomy and then attempted iliac intervention versus a femoral to femoral bypass plus potential right iliac stent placement.  I elected to terminate the procedure. The sheath was removed and StarClose closure device was deployed in the right femoral artery with excellent hemostatic  result. The patient was taken to the recovery room in stable condition having tolerated the procedure well.  Findings:               Aortogram:  This demonstrated normal renal arteries and normal aorta.  The right common iliac artery was irregular and appeared to have mild to moderate stenosis in the 50 to 60% range.  The right external iliac artery was patent.  The right hypogastric artery was significantly diseased.  The left common, internal, and external iliac arteries were all occluded.  The previously placed stents went down into the left common femoral artery.  There was occlusion down through the left common femoral artery with reconstitution of the femoral bifurcation.             Left Lower Extremity:  This demonstrated occlusion of the common femoral artery with reconstitution of the profunda and proximal SFA near their origins.  The SFA, popliteal artery, and tibial vessels all appear to be widely patent although opacification was reasonably slow distally due to the inflow occlusion   Disposition: Patient was taken to the recovery room in stable condition having tolerated the procedure well.  Complications: None  Festus Barren 06/14/2023 12:38 PM   This note was created with Dragon Medical transcription system. Any errors in dictation are purely unintentional.

## 2023-06-15 ENCOUNTER — Encounter: Payer: Self-pay | Admitting: Vascular Surgery

## 2023-06-15 ENCOUNTER — Telehealth (INDEPENDENT_AMBULATORY_CARE_PROVIDER_SITE_OTHER): Payer: Self-pay

## 2023-06-15 NOTE — Telephone Encounter (Signed)
Spoke with the patient's significant other in a return call. They wanted to know if the patient had an appt scheduled for cardiac clearance. I advised that the referral had been sent to [facility] clinic and maybe they should contact [facility].

## 2023-06-22 ENCOUNTER — Ambulatory Visit: Admit: 2023-06-22 | Payer: 59 | Admitting: Internal Medicine

## 2023-06-22 HISTORY — DX: Prediabetes: R73.03

## 2023-06-22 HISTORY — DX: Dizziness and giddiness: R42

## 2023-06-22 HISTORY — DX: Other specified bacterial intestinal infections: A04.8

## 2023-06-22 HISTORY — DX: Gastric intestinal metaplasia, unspecified: K31.A0

## 2023-06-22 HISTORY — DX: Spinal stenosis, lumbar region without neurogenic claudication: M48.061

## 2023-06-22 HISTORY — DX: Atherosclerosis of native arteries of extremities with gangrene, bilateral legs: I70.263

## 2023-06-22 HISTORY — DX: Chronic pain syndrome: G89.4

## 2023-06-22 HISTORY — DX: Depression, unspecified: F32.A

## 2023-06-22 HISTORY — DX: Other specified disorders of adrenal gland: E27.8

## 2023-06-22 HISTORY — DX: Other specified mononeuropathies: G58.8

## 2023-06-22 HISTORY — DX: Atherosclerotic heart disease of native coronary artery without angina pectoris: I25.10

## 2023-06-22 HISTORY — DX: Calculus of kidney: N20.0

## 2023-06-22 HISTORY — DX: Gastro-esophageal reflux disease without esophagitis: K21.9

## 2023-06-22 HISTORY — DX: Occlusion and stenosis of bilateral carotid arteries: I65.23

## 2023-06-22 SURGERY — COLONOSCOPY
Anesthesia: General

## 2023-06-23 ENCOUNTER — Other Ambulatory Visit (INDEPENDENT_AMBULATORY_CARE_PROVIDER_SITE_OTHER): Payer: Self-pay | Admitting: Nurse Practitioner

## 2023-06-23 ENCOUNTER — Encounter
Admission: RE | Admit: 2023-06-23 | Discharge: 2023-06-23 | Disposition: A | Discharge status: Home / Self Care | Payer: 59 | Source: Ambulatory Visit | Attending: Vascular Surgery | Admitting: Vascular Surgery

## 2023-06-23 ENCOUNTER — Other Ambulatory Visit: Payer: Self-pay

## 2023-06-23 ENCOUNTER — Encounter: Payer: Self-pay | Admitting: Urgent Care

## 2023-06-23 VITALS — Ht 66.0 in | Wt 173.0 lb

## 2023-06-23 DIAGNOSIS — I70222 Atherosclerosis of native arteries of extremities with rest pain, left leg: Secondary | ICD-10-CM | POA: Insufficient documentation

## 2023-06-23 DIAGNOSIS — Z01812 Encounter for preprocedural laboratory examination: Secondary | ICD-10-CM

## 2023-06-23 DIAGNOSIS — E1140 Type 2 diabetes mellitus with diabetic neuropathy, unspecified: Secondary | ICD-10-CM

## 2023-06-23 HISTORY — DX: Type 2 diabetes mellitus with diabetic nephropathy: E11.21

## 2023-06-23 HISTORY — DX: Reserved for inherently not codable concepts without codable children: IMO0001

## 2023-06-23 HISTORY — DX: Nicotine dependence, unspecified, uncomplicated: F17.200

## 2023-06-23 HISTORY — DX: Other intervertebral disc degeneration, lumbar region with discogenic back pain only: M51.360

## 2023-06-23 HISTORY — DX: Spondylosis without myelopathy or radiculopathy, lumbar region: M47.816

## 2023-06-23 HISTORY — DX: Dyspnea, unspecified: R06.00

## 2023-06-23 HISTORY — DX: Localized swelling, mass and lump, unspecified: R22.9

## 2023-06-23 HISTORY — DX: Other intervertebral disc degeneration, thoracic region: M51.34

## 2023-06-23 HISTORY — DX: Long term (current) use of insulin: Z79.4

## 2023-06-23 LAB — TYPE AND SCREEN
ABO/RH(D): O POS
Antibody Screen: NEGATIVE

## 2023-06-23 LAB — CBC WITH DIFFERENTIAL/PLATELET
Abs Immature Granulocytes: 0.04 K/uL (ref 0.00–0.07)
Basophils Absolute: 0.1 K/uL (ref 0.0–0.1)
Basophils Relative: 1 %
Eosinophils Absolute: 0.3 K/uL (ref 0.0–0.5)
Eosinophils Relative: 5 %
HCT: 50.6 % (ref 39.0–52.0)
Hemoglobin: 18.1 g/dL — ABNORMAL HIGH (ref 13.0–17.0)
Immature Granulocytes: 1 %
Lymphocytes Relative: 28 %
Lymphs Abs: 2 K/uL (ref 0.7–4.0)
MCH: 31.8 pg (ref 26.0–34.0)
MCHC: 35.8 g/dL (ref 30.0–36.0)
MCV: 88.9 fL (ref 80.0–100.0)
Monocytes Absolute: 0.6 K/uL (ref 0.1–1.0)
Monocytes Relative: 8 %
Neutro Abs: 4.3 K/uL (ref 1.7–7.7)
Neutrophils Relative %: 57 %
Platelets: 232 K/uL (ref 150–400)
RBC: 5.69 MIL/uL (ref 4.22–5.81)
RDW: 12.9 % (ref 11.5–15.5)
WBC: 7.3 K/uL (ref 4.0–10.5)
nRBC: 0 % (ref 0.0–0.2)

## 2023-06-23 LAB — BASIC METABOLIC PANEL WITH GFR
Anion gap: 12 (ref 5–15)
BUN: 22 mg/dL (ref 8–23)
CO2: 24 mmol/L (ref 22–32)
Calcium: 9.7 mg/dL (ref 8.9–10.3)
Chloride: 101 mmol/L (ref 98–111)
Creatinine, Ser: 1.36 mg/dL — ABNORMAL HIGH (ref 0.61–1.24)
GFR, Estimated: 57 mL/min — ABNORMAL LOW
Glucose, Bld: 200 mg/dL — ABNORMAL HIGH (ref 70–99)
Potassium: 3.7 mmol/L (ref 3.5–5.1)
Sodium: 137 mmol/L (ref 135–145)

## 2023-06-23 LAB — SURGICAL PCR SCREEN
MRSA, PCR: NEGATIVE
Staphylococcus aureus: NEGATIVE

## 2023-06-23 NOTE — Patient Instructions (Addendum)
Your procedure is scheduled on:  Wednesday, February 5  Report to the Registration Desk on the 1st floor of the [facility] To find out your arrival time, please call [phone] between 1PM - 3PM on:  Tuesday , February 4  If your arrival time is 6:00 am, do not arrive before that time as the [facility] entrance doors do not open until 6:00 am.  REMEMBER: Instructions that are not followed completely may result in serious medical risk, up to and including death; or upon the discretion of your surgeon and anesthesiologist your surgery may need to be rescheduled.  Do not eat food after midnight the night before surgery.  No gum chewing or hard candies.  One week prior to surgery: Starting Wednesday, January 29  Stop Anti-inflammatories (NSAIDS) such as Advil, Aleve, Ibuprofen, Motrin, Naproxen, Naprosyn and Aspirin based products such as Excedrin, Goody's Powder, BC Powder. Stop ANY OVER THE COUNTER supplements until after surgery.  You may however, continue to take Tylenol if needed for pain up until the day of surgery.  **Follow guidelines for insulin and diabetes medications.** glipiZIDE (GLUCOTROL XL) day before surgery take only morning and/or lunch doses. Do not take evening dose. Do not take the morning of surgery.   insulin glargine (LANTUS SOLOSTAR) The day before surgery, take the morning dose but do not take the evening dose. Do not take the morning of surgery.   JANUVIA hold the morning of surgery, last dose Tuesday February 4   **Follow recommendations regarding stopping blood thinners.** clopidogrel (PLAVIX) hold for 7 days prior to surgery, last dose Tuesday January 28   aspirin EC continue until the day of surgery, last dose Tuesday, February 4   Continue taking all of your other prescription medications up until the day of surgery.  ON THE DAY OF SURGERY ONLY TAKE THESE MEDICATIONS WITH SIPS OF WATER:  celecoxib (CELEBREX)  pantoprazole (PROTONIX)   fenofibrate (TRICOR)  pravastatin (PRAVACHOL)  pregabalin (LYRICA)   Use inhalers on the day of surgery and bring to the hospital. albuterol (VENTOLIN HFA)   No Alcohol for 24 hours before or after surgery.  No Smoking including e-cigarettes for 24 hours before surgery.  No chewable tobacco products for at least 6 hours before surgery.  No nicotine patches on the day of surgery.  Do not use any "recreational" drugs for at least a week (preferably 2 weeks) before your surgery.  Please be advised that the combination of cocaine and anesthesia may have negative outcomes, up to and including death. If you test positive for cocaine, your surgery will be cancelled.  On the morning of surgery brush your teeth with toothpaste and water, you may rinse your mouth with mouthwash if you wish. Do not swallow any toothpaste or mouthwash.  Use CHG Soap as directed on instruction sheet.  Do not wear jewelry, make-up, hairpins, clips or nail polish.  For welded (permanent) jewelry: bracelets, anklets, waist bands, etc.  Please have this removed prior to surgery.  If it is not removed, there is a chance that hospital personnel will need to cut it off on the day of surgery.  Do not wear lotions, powders, or perfumes.   Do not shave body hair from the neck down 48 hours before surgery.  Contact lenses, hearing aids and dentures may not be worn into surgery.  Do not bring valuables to the hospital. [facility] is not responsible for any missing/lost belongings or valuables.   Notify your doctor if there  is any change in your medical condition (cold, fever, infection).  Wear comfortable clothing (specific to your surgery type) to the hospital.  After surgery, you can help prevent lung complications by doing breathing exercises.  Take deep breaths and cough every 1-2 hours.   If you are being admitted to the hospital overnight, leave your suitcase in the car. After surgery it may be brought to  your room.  In case of increased patient census, it may be necessary for you, the patient, to continue your postoperative care in the Same Day Surgery department.  If you are being discharged the day of surgery, you will not be allowed to drive home. You will need a responsible individual to drive you home and stay with you for 24 hours after surgery.   If you are taking public transportation, you will need to have a responsible individual with you.  Please call the Pre-admissions Testing Dept. at [phone] if you have any questions about these instructions.  Surgery Visitation Policy:  Patients having surgery or a procedure may have two visitors.  Children under the age of 11 must have an adult with them who is not the patient.  Temporary Visitor Restrictions Due to increasing cases of flu, RSV and COVID-19: Children ages 30 and under will not be able to visit patients in [facility] hospitals under most circumstances.  Inpatient Visitation:    Visiting hours are 7 a.m. to 8 p.m. Up to four visitors are allowed at one time in a patient room. The visitors may rotate out with other people during the day.  One visitor age 22 or older may stay with the patient overnight and must be in the room by 8 p.m.         Preparing for Surgery with CHLORHEXIDINE GLUCONATE (CHG) Soap  Chlorhexidine Gluconate (CHG) Soap  o An antiseptic cleaner that kills germs and bonds with the skin to continue killing germs even after washing  o Used for showering the night before surgery and morning of surgery  Before surgery, you can play an important role by reducing the number of germs on your skin.  CHG (Chlorhexidine gluconate) soap is an antiseptic cleanser which kills germs and bonds with the skin to continue killing germs even after washing.  Please do not use if you have an allergy to CHG or antibacterial soaps. If your skin becomes reddened/irritated stop using the CHG.  1. Shower the  NIGHT BEFORE SURGERY and the MORNING OF SURGERY with CHG soap.  2. If you choose to wash your hair, wash your hair first as usual with your normal shampoo.  3. After shampooing, rinse your hair and body thoroughly to remove the shampoo.  4. Use CHG as you would any other liquid soap. You can apply CHG directly to the skin and wash gently with a scrungie or a clean washcloth.  5. Apply the CHG soap to your body only from the neck down. Do not use on open wounds or open sores. Avoid contact with your eyes, ears, mouth, and genitals (private parts). Wash face and genitals (private parts) with your normal soap.  6. Wash thoroughly, paying special attention to the area where your surgery will be performed.  7. Thoroughly rinse your body with warm water.  8. Do not shower/wash with your normal soap after using and rinsing off the CHG soap.  9. Pat yourself dry with a clean towel.  10. Wear clean pajamas to bed the night before surgery.  11. Place clean sheets on your bed the night of your first shower and do not sleep with pets.  12. Shower again with the CHG soap on the day of surgery prior to arriving at the hospital.  13. Do not apply any deodorants/lotions/powders.  14. Please wear clean clothes to the hospital.

## 2023-06-24 ENCOUNTER — Telehealth (INDEPENDENT_AMBULATORY_CARE_PROVIDER_SITE_OTHER): Payer: Self-pay

## 2023-06-24 NOTE — Telephone Encounter (Signed)
Spoke with the patient and he is scheduled for left femoral endarterectomy with bilateral iliac stents and possible fem/fem bypass surgery with Dr. Wyn Quaker on 06/30/23 at the [facility]. Pre-op was on 06/23/23 at the [facility]. Pre-surgical instructions were discussed and will be sent to Mychart and mailed.

## 2023-06-30 ENCOUNTER — Inpatient Hospital Stay
Admission: RE | Admit: 2023-06-30 | Discharge: 2023-07-05 | DRG: 271 | Disposition: A | Discharge status: Home / Self Care | Payer: 59 | Attending: Vascular Surgery | Admitting: Vascular Surgery

## 2023-06-30 ENCOUNTER — Inpatient Hospital Stay: Payer: 59 | Admitting: Urgent Care

## 2023-06-30 ENCOUNTER — Encounter
Admission: RE | Disposition: A | Discharge status: Home / Self Care | Payer: Self-pay | Source: Home / Self Care | Attending: Vascular Surgery

## 2023-06-30 ENCOUNTER — Other Ambulatory Visit: Payer: Self-pay

## 2023-06-30 ENCOUNTER — Encounter: Payer: Self-pay | Admitting: Vascular Surgery

## 2023-06-30 ENCOUNTER — Inpatient Hospital Stay: Payer: 59

## 2023-06-30 DIAGNOSIS — Z7982 Long term (current) use of aspirin: Secondary | ICD-10-CM | POA: Diagnosis not present

## 2023-06-30 DIAGNOSIS — Z7984 Long term (current) use of oral hypoglycemic drugs: Secondary | ICD-10-CM

## 2023-06-30 DIAGNOSIS — Z8546 Personal history of malignant neoplasm of prostate: Secondary | ICD-10-CM

## 2023-06-30 DIAGNOSIS — Z87442 Personal history of urinary calculi: Secondary | ICD-10-CM

## 2023-06-30 DIAGNOSIS — F17210 Nicotine dependence, cigarettes, uncomplicated: Secondary | ICD-10-CM | POA: Diagnosis present

## 2023-06-30 DIAGNOSIS — I2510 Atherosclerotic heart disease of native coronary artery without angina pectoris: Secondary | ICD-10-CM | POA: Diagnosis present

## 2023-06-30 DIAGNOSIS — I70201 Unspecified atherosclerosis of native arteries of extremities, right leg: Secondary | ICD-10-CM

## 2023-06-30 DIAGNOSIS — E1121 Type 2 diabetes mellitus with diabetic nephropathy: Secondary | ICD-10-CM | POA: Diagnosis present

## 2023-06-30 DIAGNOSIS — I743 Embolism and thrombosis of arteries of the lower extremities: Secondary | ICD-10-CM | POA: Diagnosis present

## 2023-06-30 DIAGNOSIS — Z79899 Other long term (current) drug therapy: Secondary | ICD-10-CM

## 2023-06-30 DIAGNOSIS — Z91040 Latex allergy status: Secondary | ICD-10-CM

## 2023-06-30 DIAGNOSIS — T82868A Thrombosis of vascular prosthetic devices, implants and grafts, initial encounter: Secondary | ICD-10-CM

## 2023-06-30 DIAGNOSIS — Z01812 Encounter for preprocedural laboratory examination: Secondary | ICD-10-CM

## 2023-06-30 DIAGNOSIS — I10 Essential (primary) hypertension: Secondary | ICD-10-CM | POA: Diagnosis present

## 2023-06-30 DIAGNOSIS — Z9889 Other specified postprocedural states: Secondary | ICD-10-CM

## 2023-06-30 DIAGNOSIS — I70229 Atherosclerosis of native arteries of extremities with rest pain, unspecified extremity: Principal | ICD-10-CM | POA: Diagnosis present

## 2023-06-30 DIAGNOSIS — I70222 Atherosclerosis of native arteries of extremities with rest pain, left leg: Principal | ICD-10-CM | POA: Diagnosis present

## 2023-06-30 DIAGNOSIS — K219 Gastro-esophageal reflux disease without esophagitis: Secondary | ICD-10-CM | POA: Diagnosis present

## 2023-06-30 DIAGNOSIS — J449 Chronic obstructive pulmonary disease, unspecified: Secondary | ICD-10-CM | POA: Diagnosis present

## 2023-06-30 DIAGNOSIS — Z8249 Family history of ischemic heart disease and other diseases of the circulatory system: Secondary | ICD-10-CM

## 2023-06-30 DIAGNOSIS — E1140 Type 2 diabetes mellitus with diabetic neuropathy, unspecified: Secondary | ICD-10-CM | POA: Diagnosis present

## 2023-06-30 DIAGNOSIS — G894 Chronic pain syndrome: Secondary | ICD-10-CM | POA: Diagnosis present

## 2023-06-30 DIAGNOSIS — Z8673 Personal history of transient ischemic attack (TIA), and cerebral infarction without residual deficits: Secondary | ICD-10-CM | POA: Diagnosis not present

## 2023-06-30 DIAGNOSIS — Z794 Long term (current) use of insulin: Secondary | ICD-10-CM | POA: Diagnosis not present

## 2023-06-30 DIAGNOSIS — E785 Hyperlipidemia, unspecified: Secondary | ICD-10-CM | POA: Diagnosis present

## 2023-06-30 DIAGNOSIS — F17290 Nicotine dependence, other tobacco product, uncomplicated: Secondary | ICD-10-CM | POA: Diagnosis present

## 2023-06-30 DIAGNOSIS — Z833 Family history of diabetes mellitus: Secondary | ICD-10-CM

## 2023-06-30 DIAGNOSIS — I745 Embolism and thrombosis of iliac artery: Secondary | ICD-10-CM | POA: Diagnosis present

## 2023-06-30 DIAGNOSIS — Z7902 Long term (current) use of antithrombotics/antiplatelets: Secondary | ICD-10-CM | POA: Diagnosis not present

## 2023-06-30 DIAGNOSIS — E1151 Type 2 diabetes mellitus with diabetic peripheral angiopathy without gangrene: Secondary | ICD-10-CM | POA: Diagnosis present

## 2023-06-30 DIAGNOSIS — T82856A Stenosis of peripheral vascular stent, initial encounter: Secondary | ICD-10-CM

## 2023-06-30 DIAGNOSIS — R1032 Left lower quadrant pain: Secondary | ICD-10-CM | POA: Diagnosis present

## 2023-06-30 DIAGNOSIS — Z801 Family history of malignant neoplasm of trachea, bronchus and lung: Secondary | ICD-10-CM

## 2023-06-30 HISTORY — PX: ENDARTERECTOMY FEMORAL: SHX5804

## 2023-06-30 HISTORY — PX: INSERTION OF ILIAC STENT: SHX6256

## 2023-06-30 LAB — CBC
HCT: 47.1 % (ref 39.0–52.0)
Hemoglobin: 16 g/dL (ref 13.0–17.0)
MCH: 32 pg (ref 26.0–34.0)
MCHC: 34 g/dL (ref 30.0–36.0)
MCV: 94.2 fL (ref 80.0–100.0)
Platelets: 177 10*3/uL (ref 150–400)
RBC: 5 MIL/uL (ref 4.22–5.81)
RDW: 12.7 % (ref 11.5–15.5)
WBC: 9.2 10*3/uL (ref 4.0–10.5)
nRBC: 0 % (ref 0.0–0.2)

## 2023-06-30 LAB — CREATININE, SERUM
Creatinine, Ser: 1.21 mg/dL (ref 0.61–1.24)
GFR, Estimated: >60 mL/min (ref >60)

## 2023-06-30 LAB — GLUCOSE, CAPILLARY
Glucose-Capillary: 137 mg/dL — ABNORMAL HIGH (ref 70–99)
Glucose-Capillary: 180 mg/dL — ABNORMAL HIGH (ref 70–99)
Glucose-Capillary: 189 mg/dL — ABNORMAL HIGH (ref 70–99)
Glucose-Capillary: 321 mg/dL — ABNORMAL HIGH (ref 70–99)

## 2023-06-30 LAB — ABO/RH: ABO/RH(D): O POS

## 2023-06-30 SURGERY — ENDARTERECTOMY, FEMORAL
Anesthesia: General | Laterality: Left

## 2023-06-30 MED ORDER — SORBITOL 70 % SOLN: 30.0000 mL | Freq: Every day | Status: DC | PRN

## 2023-06-30 MED ORDER — HYDROMORPHONE HCL 1 MG/ML IJ SOLN
INTRAMUSCULAR | Status: DC | PRN
  Administered 2023-06-30 (×2): .5 mg via INTRAVENOUS

## 2023-06-30 MED ORDER — CYCLOBENZAPRINE HCL 10 MG PO TABS: 10.0000 mg | ORAL_TABLET | Freq: Two times a day (BID) | ORAL | Status: DC | PRN

## 2023-06-30 MED ORDER — SUGAMMADEX SODIUM 200 MG/2ML IV SOLN
INTRAVENOUS | Status: DC | PRN
  Administered 2023-06-30: 200 mg via INTRAVENOUS

## 2023-06-30 MED ORDER — CHLORHEXIDINE GLUCONATE CLOTH 2 % EX PADS: 6.0000 | MEDICATED_PAD | Freq: Once | CUTANEOUS | Status: DC

## 2023-06-30 MED ORDER — DEXAMETHASONE SODIUM PHOSPHATE 10 MG/ML IJ SOLN
INTRAMUSCULAR | Status: AC
  Filled 2023-06-30: qty 1

## 2023-06-30 MED ORDER — FAMOTIDINE 20 MG PO TABS
20.0000 mg | ORAL_TABLET | Freq: Two times a day (BID) | ORAL | Status: DC
Start: 2023-06-30 — End: 2023-07-05
  Administered 2023-06-30 – 2023-07-05 (×10): 20 mg via ORAL
  Filled 2023-06-30 (×11): qty 1

## 2023-06-30 MED ORDER — OXYCODONE HCL 5 MG/5ML PO SOLN: 5.0000 mg | Freq: Once | ORAL | Status: DC | PRN

## 2023-06-30 MED ORDER — CLONAZEPAM 0.5 MG PO TABS
0.5000 mg | ORAL_TABLET | Freq: Every evening | ORAL | Status: DC | PRN
  Administered 2023-07-02 – 2023-07-04 (×3): 0.5 mg via ORAL
  Filled 2023-06-30 (×3): qty 1

## 2023-06-30 MED ORDER — PROPOFOL 10 MG/ML IV BOLUS
INTRAVENOUS | Status: DC | PRN
  Administered 2023-06-30: 150 mg via INTRAVENOUS

## 2023-06-30 MED ORDER — HYDROCHLOROTHIAZIDE 25 MG PO TABS
25.0000 mg | ORAL_TABLET | Freq: Every day | ORAL | Status: DC
  Administered 2023-06-30 – 2023-07-05 (×6): 25 mg via ORAL
  Filled 2023-06-30 (×6): qty 1

## 2023-06-30 MED ORDER — PHENYLEPHRINE HCL-NACL 20-0.9 MG/250ML-% IV SOLN
INTRAVENOUS | Status: DC | PRN
  Administered 2023-06-30: 20 ug/min via INTRAVENOUS

## 2023-06-30 MED ORDER — DOCUSATE SODIUM 100 MG PO CAPS
100.0000 mg | ORAL_CAPSULE | Freq: Every day | ORAL | Status: DC
  Administered 2023-07-02 – 2023-07-05 (×4): 100 mg via ORAL
  Filled 2023-06-30 (×5): qty 1

## 2023-06-30 MED ORDER — FENOFIBRATE 160 MG PO TABS
160.0000 mg | ORAL_TABLET | Freq: Every day | ORAL | Status: DC
  Administered 2023-07-01 – 2023-07-05 (×5): 160 mg via ORAL
  Filled 2023-06-30 (×5): qty 1

## 2023-06-30 MED ORDER — NORTRIPTYLINE HCL 10 MG PO CAPS
20.0000 mg | ORAL_CAPSULE | Freq: Every day | ORAL | Status: DC
  Administered 2023-06-30 – 2023-07-04 (×5): 20 mg via ORAL
  Filled 2023-06-30 (×6): qty 2

## 2023-06-30 MED ORDER — PHENOL 1.4 % MT LIQD: 1.0000 | OROMUCOSAL | Status: DC | PRN

## 2023-06-30 MED ORDER — CHLORHEXIDINE GLUCONATE 0.12 % MT SOLN
OROMUCOSAL | Status: AC
  Filled 2023-06-30: qty 15

## 2023-06-30 MED ORDER — CEFAZOLIN SODIUM-DEXTROSE 2-4 GM/100ML-% IV SOLN
2.0000 g | INTRAVENOUS | Status: AC
  Administered 2023-06-30: 2 g via INTRAVENOUS

## 2023-06-30 MED ORDER — LIDOCAINE HCL (PF) 2 % IJ SOLN
INTRAMUSCULAR | Status: AC
  Filled 2023-06-30: qty 5

## 2023-06-30 MED ORDER — SODIUM CHLORIDE 0.9 % IV SOLN: 500.0000 mL | Freq: Once | INTRAVENOUS | Status: DC | PRN

## 2023-06-30 MED ORDER — HEPARIN 30,000 UNITS/1000 ML (OHS) CELLSAVER SOLUTION
Status: AC
  Filled 2023-06-30: qty 1000

## 2023-06-30 MED ORDER — OXYCODONE-ACETAMINOPHEN 5-325 MG PO TABS
1.0000 | ORAL_TABLET | ORAL | Status: DC | PRN
  Administered 2023-06-30 – 2023-07-02 (×6): 1 via ORAL
  Administered 2023-07-03 – 2023-07-05 (×3): 2 via ORAL
  Filled 2023-06-30: qty 2
  Filled 2023-06-30 (×5): qty 1
  Filled 2023-06-30: qty 2
  Filled 2023-06-30: qty 1
  Filled 2023-06-30: qty 2

## 2023-06-30 MED ORDER — HEPARIN 30,000 UNITS/1000 ML (OHS) CELLSAVER SOLUTION
Status: AC | PRN
  Administered 2023-06-30: 1

## 2023-06-30 MED ORDER — GLIPIZIDE ER 2.5 MG PO TB24
2.5000 mg | ORAL_TABLET | Freq: Every day | ORAL | Status: DC
  Administered 2023-06-30 – 2023-07-05 (×6): 2.5 mg via ORAL
  Filled 2023-06-30 (×6): qty 1

## 2023-06-30 MED ORDER — PHENYLEPHRINE 80 MCG/ML (10ML) SYRINGE FOR IV PUSH (FOR BLOOD PRESSURE SUPPORT)
PREFILLED_SYRINGE | INTRAVENOUS | Status: AC
  Filled 2023-06-30: qty 10

## 2023-06-30 MED ORDER — HYDRALAZINE HCL 20 MG/ML IJ SOLN: 5.0000 mg | INTRAMUSCULAR | Status: DC | PRN

## 2023-06-30 MED ORDER — CEFAZOLIN SODIUM-DEXTROSE 2-4 GM/100ML-% IV SOLN
INTRAVENOUS | Status: AC
  Filled 2023-06-30: qty 100

## 2023-06-30 MED ORDER — SODIUM CHLORIDE 0.9 % IR SOLN
Status: DC | PRN
  Administered 2023-06-30: 501 mL

## 2023-06-30 MED ORDER — HYDROMORPHONE HCL 1 MG/ML IJ SOLN: 1.0000 mg | Freq: Once | INTRAMUSCULAR | Status: DC | PRN

## 2023-06-30 MED ORDER — ALBUTEROL SULFATE (2.5 MG/3ML) 0.083% IN NEBU: 2.5000 mg | INHALATION_SOLUTION | Freq: Four times a day (QID) | RESPIRATORY_TRACT | Status: DC | PRN

## 2023-06-30 MED ORDER — VANCOMYCIN HCL 1000 MG IV SOLR
INTRAVENOUS | Status: AC
  Filled 2023-06-30: qty 20

## 2023-06-30 MED ORDER — ONDANSETRON HCL 4 MG/2ML IJ SOLN: 4.0000 mg | Freq: Once | INTRAMUSCULAR | Status: DC | PRN

## 2023-06-30 MED ORDER — FENTANYL CITRATE (PF) 100 MCG/2ML IJ SOLN
INTRAMUSCULAR | Status: DC | PRN
  Administered 2023-06-30 (×2): 50 ug via INTRAVENOUS

## 2023-06-30 MED ORDER — GUAIFENESIN-DM 100-10 MG/5ML PO SYRP: 15.0000 mL | ORAL_SOLUTION | ORAL | Status: DC | PRN

## 2023-06-30 MED ORDER — CLOPIDOGREL BISULFATE 75 MG PO TABS
75.0000 mg | ORAL_TABLET | Freq: Every day | ORAL | Status: DC
  Administered 2023-06-30 – 2023-07-05 (×6): 75 mg via ORAL
  Filled 2023-06-30 (×6): qty 1

## 2023-06-30 MED ORDER — ONDANSETRON HCL 4 MG/2ML IJ SOLN: 4.0000 mg | Freq: Four times a day (QID) | INTRAMUSCULAR | Status: DC | PRN

## 2023-06-30 MED ORDER — EPHEDRINE SULFATE-NACL 50-0.9 MG/10ML-% IV SOSY
PREFILLED_SYRINGE | INTRAVENOUS | Status: DC | PRN
  Administered 2023-06-30: 10 mg via INTRAVENOUS

## 2023-06-30 MED ORDER — CHLORHEXIDINE GLUCONATE CLOTH 2 % EX PADS
6.0000 | MEDICATED_PAD | Freq: Once | CUTANEOUS | Status: DC
  Administered 2023-06-30: 6 via TOPICAL

## 2023-06-30 MED ORDER — INSULIN ASPART 100 UNIT/ML IJ SOLN
0.0000 [IU] | Freq: Every day | INTRAMUSCULAR | Status: DC
  Administered 2023-06-30: 4 [IU] via SUBCUTANEOUS
  Administered 2023-07-01: 2 [IU] via SUBCUTANEOUS
  Filled 2023-06-30 (×2): qty 1

## 2023-06-30 MED ORDER — NITROGLYCERIN IN D5W 200-5 MCG/ML-% IV SOLN: 5.0000 ug/min | INTRAVENOUS | Status: DC

## 2023-06-30 MED ORDER — HYDROMORPHONE HCL 1 MG/ML IJ SOLN
INTRAMUSCULAR | Status: AC
  Filled 2023-06-30: qty 1

## 2023-06-30 MED ORDER — GENTAMICIN SULFATE 40 MG/ML IJ SOLN
INTRAMUSCULAR | Status: AC
  Filled 2023-06-30: qty 2

## 2023-06-30 MED ORDER — INSULIN ASPART 100 UNIT/ML IJ SOLN
0.0000 [IU] | Freq: Three times a day (TID) | INTRAMUSCULAR | Status: DC
  Administered 2023-06-30 – 2023-07-02 (×6): 3 [IU] via SUBCUTANEOUS
  Administered 2023-07-02: 2 [IU] via SUBCUTANEOUS
  Administered 2023-07-03: 3 [IU] via SUBCUTANEOUS
  Administered 2023-07-03: 2 [IU] via SUBCUTANEOUS
  Administered 2023-07-03 – 2023-07-04 (×4): 3 [IU] via SUBCUTANEOUS
  Administered 2023-07-05 (×2): 2 [IU] via SUBCUTANEOUS
  Filled 2023-06-30 (×14): qty 1

## 2023-06-30 MED ORDER — FENTANYL CITRATE (PF) 100 MCG/2ML IJ SOLN
INTRAMUSCULAR | Status: AC
  Filled 2023-06-30: qty 2

## 2023-06-30 MED ORDER — LIDOCAINE HCL (CARDIAC) PF 100 MG/5ML IV SOSY
PREFILLED_SYRINGE | INTRAVENOUS | Status: DC | PRN
  Administered 2023-06-30: 60 mg via INTRAVENOUS

## 2023-06-30 MED ORDER — METOPROLOL TARTRATE 5 MG/5ML IV SOLN: 2.0000 mg | INTRAVENOUS | Status: DC | PRN

## 2023-06-30 MED ORDER — SENNOSIDES-DOCUSATE SODIUM 8.6-50 MG PO TABS
1.0000 | ORAL_TABLET | Freq: Every evening | ORAL | Status: DC | PRN
  Administered 2023-07-03: 1 via ORAL
  Filled 2023-06-30: qty 1

## 2023-06-30 MED ORDER — EPHEDRINE 5 MG/ML INJ
INTRAVENOUS | Status: AC
  Filled 2023-06-30: qty 5

## 2023-06-30 MED ORDER — PRAVASTATIN SODIUM 20 MG PO TABS
40.0000 mg | ORAL_TABLET | Freq: Every day | ORAL | Status: DC
Start: 2023-07-01 — End: 2023-07-05
  Administered 2023-07-01 – 2023-07-05 (×5): 40 mg via ORAL
  Filled 2023-06-30 (×5): qty 2

## 2023-06-30 MED ORDER — PANTOPRAZOLE SODIUM 40 MG PO TBEC
40.0000 mg | DELAYED_RELEASE_TABLET | Freq: Every day | ORAL | Status: DC
Start: 2023-07-01 — End: 2023-07-05
  Administered 2023-07-01 – 2023-07-05 (×5): 40 mg via ORAL
  Filled 2023-06-30 (×5): qty 1

## 2023-06-30 MED ORDER — ACETAMINOPHEN 650 MG RE SUPP: 325.0000 mg | RECTAL | Status: DC | PRN

## 2023-06-30 MED ORDER — HEPARIN SODIUM (PORCINE) 1000 UNIT/ML IJ SOLN
INTRAMUSCULAR | Status: DC | PRN
  Administered 2023-06-30: 6000 [IU] via INTRAVENOUS

## 2023-06-30 MED ORDER — LINAGLIPTIN 5 MG PO TABS
5.0000 mg | ORAL_TABLET | Freq: Every day | ORAL | Status: DC
  Administered 2023-06-30 – 2023-07-05 (×6): 5 mg via ORAL
  Filled 2023-06-30 (×6): qty 1

## 2023-06-30 MED ORDER — ENOXAPARIN SODIUM 40 MG/0.4ML IJ SOSY
40.0000 mg | PREFILLED_SYRINGE | INTRAMUSCULAR | Status: DC
  Administered 2023-07-01 – 2023-07-05 (×5): 40 mg via SUBCUTANEOUS
  Filled 2023-06-30 (×5): qty 0.4

## 2023-06-30 MED ORDER — POTASSIUM CHLORIDE CRYS ER 20 MEQ PO TBCR
20.0000 meq | EXTENDED_RELEASE_TABLET | Freq: Every day | ORAL | Status: DC | PRN
Start: 2023-06-30 — End: 2023-07-05

## 2023-06-30 MED ORDER — DOPAMINE-DEXTROSE 3.2-5 MG/ML-% IV SOLN: 3.0000 ug/kg/min | INTRAVENOUS | Status: DC

## 2023-06-30 MED ORDER — ROCURONIUM BROMIDE 10 MG/ML (PF) SYRINGE
PREFILLED_SYRINGE | INTRAVENOUS | Status: AC
  Filled 2023-06-30: qty 10

## 2023-06-30 MED ORDER — FENTANYL CITRATE (PF) 100 MCG/2ML IJ SOLN: 25.0000 ug | INTRAMUSCULAR | Status: DC | PRN

## 2023-06-30 MED ORDER — MAGNESIUM SULFATE 2 GM/50ML IV SOLN: 2.0000 g | Freq: Every day | INTRAVENOUS | Status: DC | PRN

## 2023-06-30 MED ORDER — CHLORHEXIDINE GLUCONATE 0.12 % MT SOLN
15.0000 mL | Freq: Once | OROMUCOSAL | Status: AC
  Administered 2023-06-30: 15 mL via OROMUCOSAL

## 2023-06-30 MED ORDER — ACETAMINOPHEN 10 MG/ML IV SOLN: 1000.0000 mg | Freq: Once | INTRAVENOUS | Status: DC | PRN

## 2023-06-30 MED ORDER — MIDAZOLAM HCL 2 MG/2ML IJ SOLN
INTRAMUSCULAR | Status: DC | PRN
  Administered 2023-06-30: 2 mg via INTRAVENOUS

## 2023-06-30 MED ORDER — INSULIN ASPART 100 UNIT/ML IJ SOLN
0.0000 [IU] | Freq: Three times a day (TID) | INTRAMUSCULAR | Status: DC
Start: 2023-07-01 — End: 2023-06-30

## 2023-06-30 MED ORDER — ALBUTEROL SULFATE HFA 108 (90 BASE) MCG/ACT IN AERS
INHALATION_SPRAY | RESPIRATORY_TRACT | Status: DC | PRN
  Administered 2023-06-30: 4 via RESPIRATORY_TRACT
  Administered 2023-06-30: 2 via RESPIRATORY_TRACT

## 2023-06-30 MED ORDER — CEFAZOLIN SODIUM-DEXTROSE 2-4 GM/100ML-% IV SOLN
2.0000 g | Freq: Three times a day (TID) | INTRAVENOUS | Status: AC
  Administered 2023-06-30 – 2023-07-01 (×2): 2 g via INTRAVENOUS
  Filled 2023-06-30 (×2): qty 100

## 2023-06-30 MED ORDER — DEXAMETHASONE SODIUM PHOSPHATE 10 MG/ML IJ SOLN
INTRAMUSCULAR | Status: DC | PRN
  Administered 2023-06-30: 5 mg via INTRAVENOUS

## 2023-06-30 MED ORDER — FAMOTIDINE IN NACL 20-0.9 MG/50ML-% IV SOLN
20.0000 mg | Freq: Two times a day (BID) | INTRAVENOUS | Status: DC
  Filled 2023-06-30 (×2): qty 50

## 2023-06-30 MED ORDER — ASPIRIN 81 MG PO TBEC
81.0000 mg | DELAYED_RELEASE_TABLET | Freq: Every day | ORAL | Status: DC
  Administered 2023-06-30 – 2023-07-05 (×6): 81 mg via ORAL
  Filled 2023-06-30 (×6): qty 1

## 2023-06-30 MED ORDER — CELECOXIB 200 MG PO CAPS
200.0000 mg | ORAL_CAPSULE | Freq: Every day | ORAL | Status: DC
  Administered 2023-07-01: 200 mg via ORAL
  Filled 2023-06-30: qty 1

## 2023-06-30 MED ORDER — LACTATED RINGERS IV SOLN: INTRAVENOUS | Status: DC | PRN

## 2023-06-30 MED ORDER — ORAL CARE MOUTH RINSE: 15.0000 mL | Freq: Once | OROMUCOSAL | Status: AC

## 2023-06-30 MED ORDER — ONDANSETRON HCL 4 MG/2ML IJ SOLN
INTRAMUSCULAR | Status: DC | PRN
  Administered 2023-06-30: 4 mg via INTRAVENOUS

## 2023-06-30 MED ORDER — ALUM & MAG HYDROXIDE-SIMETH 200-200-20 MG/5ML PO SUSP: 15.0000 mL | ORAL | Status: DC | PRN

## 2023-06-30 MED ORDER — LABETALOL HCL 5 MG/ML IV SOLN: 10.0000 mg | INTRAVENOUS | Status: DC | PRN

## 2023-06-30 MED ORDER — MIDAZOLAM HCL 2 MG/2ML IJ SOLN
INTRAMUSCULAR | Status: AC
  Filled 2023-06-30: qty 2

## 2023-06-30 MED ORDER — PREGABALIN 75 MG PO CAPS
75.0000 mg | ORAL_CAPSULE | Freq: Two times a day (BID) | ORAL | Status: DC
Start: 2023-06-30 — End: 2023-07-05
  Administered 2023-06-30 – 2023-07-05 (×10): 75 mg via ORAL
  Filled 2023-06-30 (×10): qty 1

## 2023-06-30 MED ORDER — SODIUM CHLORIDE 0.9 % IV SOLN: INTRAVENOUS | Status: DC

## 2023-06-30 MED ORDER — ROCURONIUM BROMIDE 100 MG/10ML IV SOLN
INTRAVENOUS | Status: DC | PRN
  Administered 2023-06-30: 50 mg via INTRAVENOUS
  Administered 2023-06-30: 10 mg via INTRAVENOUS
  Administered 2023-06-30: 50 mg via INTRAVENOUS

## 2023-06-30 MED ORDER — PHENYLEPHRINE 80 MCG/ML (10ML) SYRINGE FOR IV PUSH (FOR BLOOD PRESSURE SUPPORT)
PREFILLED_SYRINGE | INTRAVENOUS | Status: DC | PRN
  Administered 2023-06-30 (×2): 80 ug via INTRAVENOUS

## 2023-06-30 MED ORDER — POTASSIUM CHLORIDE CRYS ER 20 MEQ PO TBCR
20.0000 meq | EXTENDED_RELEASE_TABLET | Freq: Every day | ORAL | Status: DC
  Administered 2023-06-30 – 2023-07-05 (×6): 20 meq via ORAL
  Filled 2023-06-30 (×6): qty 1

## 2023-06-30 MED ORDER — PROPOFOL 10 MG/ML IV BOLUS
INTRAVENOUS | Status: AC
  Filled 2023-06-30: qty 20

## 2023-06-30 MED ORDER — OXYCODONE HCL 5 MG PO TABS: 5.0000 mg | ORAL_TABLET | Freq: Once | ORAL | Status: DC | PRN

## 2023-06-30 MED ORDER — HEPARIN SODIUM (PORCINE) 5000 UNIT/ML IJ SOLN
INTRAMUSCULAR | Status: AC
  Filled 2023-06-30: qty 1

## 2023-06-30 MED ORDER — INSULIN GLARGINE-YFGN 100 UNIT/ML ~~LOC~~ SOLN
12.0000 [IU] | Freq: Every day | SUBCUTANEOUS | Status: DC
  Administered 2023-07-01 – 2023-07-05 (×5): 12 [IU] via SUBCUTANEOUS
  Filled 2023-06-30 (×5): qty 0.12

## 2023-06-30 MED ORDER — ONDANSETRON HCL 4 MG/2ML IJ SOLN
INTRAMUSCULAR | Status: AC
  Filled 2023-06-30: qty 2

## 2023-06-30 MED ORDER — ACETAMINOPHEN 325 MG PO TABS
325.0000 mg | ORAL_TABLET | ORAL | Status: DC | PRN
Start: 2023-06-30 — End: 2023-07-05

## 2023-06-30 MED ORDER — MORPHINE SULFATE (PF) 2 MG/ML IV SOLN: 2.0000 mg | INTRAVENOUS | Status: DC | PRN

## 2023-06-30 MED ORDER — HEMOSTATIC AGENTS (NO CHARGE) OPTIME
TOPICAL | Status: DC | PRN
  Administered 2023-06-30: 1

## 2023-06-30 SURGICAL SUPPLY — 79 items
APPLIER CLIP 11 MED OPEN (CLIP)
APPLIER CLIP 9.375 SM OPEN (CLIP)
BAG DECANTER FOR FLEXI CONT (MISCELLANEOUS) ×2 IMPLANT
BAG ISOLATATION DRAPE 20X20 ST (DRAPES) IMPLANT
BALLN LUTONIX AV 10X40X75 (BALLOONS) ×2
BALLN LUTONIX AV 8X60X75 (BALLOONS) ×2
BALLOON LUTONIX AV 10X40X75 (BALLOONS) IMPLANT
BALLOON LUTONIX AV 8X60X75 (BALLOONS) IMPLANT
BLADE SURG 15 STRL LF DISP TIS (BLADE) ×2 IMPLANT
BLADE SURG SZ11 CARB STEEL (BLADE) ×2 IMPLANT
BRUSH SCRUB EZ 4% CHG (MISCELLANEOUS) ×2 IMPLANT
CATH ROTAREX 135 6FR (CATHETERS) IMPLANT
CATH SLIP 5FR 0.38 X 40 KMP (CATHETERS) IMPLANT
CHLORAPREP W/TINT 26 (MISCELLANEOUS) ×2 IMPLANT
CLAMP SUTURE YELLOW 5 PAIRS (MISCELLANEOUS) ×2 IMPLANT
CLEANSER WND VASHE 34 (WOUND CARE) IMPLANT
CLIP APPLIE 11 MED OPEN (CLIP) IMPLANT
CLIP APPLIE 9.375 SM OPEN (CLIP) IMPLANT
DERMABOND ADVANCED .7 DNX6 (GAUZE/BANDAGES/DRESSINGS) IMPLANT
DEVICE PRESTO INFLATION (MISCELLANEOUS) IMPLANT
DEVICE STARCLOSE SE CLOSURE (Vascular Products) IMPLANT
DRAPE C-ARM XRAY 36X54 (DRAPES) ×2 IMPLANT
DRAPE INCISE IOBAN 66X45 STRL (DRAPES) ×2 IMPLANT
DRESSING SURGICEL FIBRLLR 1X2 (HEMOSTASIS) ×2 IMPLANT
DRSG OPSITE POSTOP 4X6 (GAUZE/BANDAGES/DRESSINGS) IMPLANT
DRSG OPSITE POSTOP 4X8 (GAUZE/BANDAGES/DRESSINGS) IMPLANT
DRSG SURGICEL FIBRILLAR 1X2 (HEMOSTASIS) ×2
ELECT CAUTERY BLADE 6.4 (BLADE) ×2 IMPLANT
ELECT REM PT RETURN 9FT ADLT (ELECTROSURGICAL) ×2
ELECTRODE REM PT RTRN 9FT ADLT (ELECTROSURGICAL) ×2 IMPLANT
GAUZE 4X4 16PLY ~~LOC~~+RFID DBL (SPONGE) ×2 IMPLANT
GLIDEWIRE ADV .035X260CM (WIRE) IMPLANT
GLOVE BIO SURGEON STRL SZ7 (GLOVE) ×6 IMPLANT
GOWN STRL REUS W/ TWL LRG LVL3 (GOWN DISPOSABLE) ×4 IMPLANT
GOWN STRL REUS W/ TWL XL LVL3 (GOWN DISPOSABLE) ×2 IMPLANT
GOWN STRL REUS W/TWL 2XL LVL3 (GOWN DISPOSABLE) ×2 IMPLANT
GRAFT VASC PATCH XENOSURE 1X14 (Vascular Products) IMPLANT
HEMOSTAT HEMOBLAST BELLOWS (HEMOSTASIS) IMPLANT
IV NS 500ML BAXH (IV SOLUTION) ×2 IMPLANT
KIT PREVENA INCISION MGT 13 (CANNISTER) IMPLANT
KIT STIMULAN RAPID CURE 5CC (Orthopedic Implant) IMPLANT
KIT TURNOVER KIT A (KITS) ×2 IMPLANT
LABEL OR SOLS (LABEL) ×2 IMPLANT
LOOP VESSEL MAXI 1X406 RED (MISCELLANEOUS) ×4 IMPLANT
LOOP VESSEL MINI 0.8X406 BLUE (MISCELLANEOUS) ×4 IMPLANT
MANIFOLD NEPTUNE II (INSTRUMENTS) ×2 IMPLANT
NDL SAFETY ECLIPSE 18X1.5 (NEEDLE) ×2 IMPLANT
NS IRRIG 500ML POUR BTL (IV SOLUTION) ×2 IMPLANT
PACK ANGIOGRAPHY (CUSTOM PROCEDURE TRAY) IMPLANT
PACK BASIN MAJOR ARMC (MISCELLANEOUS) ×2 IMPLANT
PACK UNIVERSAL (MISCELLANEOUS) ×2 IMPLANT
PENCIL SMOKE EVACUATOR (MISCELLANEOUS) IMPLANT
RETRACTOR TRAXI PANNICULUS (MISCELLANEOUS) IMPLANT
SET WALTER ACTIVATION W/DRAPE (SET/KITS/TRAYS/PACK) ×2 IMPLANT
SHEATH BRITE TIP 8FRX11 (SHEATH) IMPLANT
SPONGE T-LAP 18X18 ~~LOC~~+RFID (SPONGE) ×4 IMPLANT
STAPLER SKIN PROX 35W (STAPLE) ×2 IMPLANT
STENT LIFESTREAM 9X38X80 (Permanent Stent) IMPLANT
STENT LIFESTREAM 9X58X80 (Permanent Stent) IMPLANT
STENT VIABAHN 9X5X120 (Permanent Stent) IMPLANT
SUT ETHILON 3-0 FS-10 30 BLK (SUTURE) ×2
SUT MNCRL AB 4-0 PS2 18 (SUTURE) IMPLANT
SUT PROLENE 5 0 RB 1 DA (SUTURE) ×4 IMPLANT
SUT PROLENE 6 0 BV (SUTURE) ×8 IMPLANT
SUT PROLENE 7 0 BV 1 (SUTURE) ×4 IMPLANT
SUT SILK 2-0 18XBRD TIE 12 (SUTURE) ×2 IMPLANT
SUT SILK 3-0 18XBRD TIE 12 (SUTURE) ×2 IMPLANT
SUT SILK 4-0 18XBRD TIE 12 (SUTURE) ×2 IMPLANT
SUT VIC AB 2-0 CT1 TAPERPNT 27 (SUTURE) ×4 IMPLANT
SUT VIC AB 3-0 SH 27X BRD (SUTURE) ×2 IMPLANT
SUT VICRYL+ 3-0 36IN CT-1 (SUTURE) ×4 IMPLANT
SUTURE EHLN 3-0 FS-10 30 BLK (SUTURE) ×2 IMPLANT
SYR 20ML LL LF (SYRINGE) ×2 IMPLANT
SYR 5ML LL (SYRINGE) ×2 IMPLANT
TAG SUTURE CLAMP YLW 5PR (MISCELLANEOUS) ×2
TRAP FLUID SMOKE EVACUATOR (MISCELLANEOUS) ×2 IMPLANT
TRAY FOLEY MTR SLVR 16FR STAT (SET/KITS/TRAYS/PACK) ×2 IMPLANT
WATER STERILE IRR 500ML POUR (IV SOLUTION) ×2 IMPLANT
WIRE G 018X200 V18 (WIRE) IMPLANT

## 2023-06-30 NOTE — Op Note (Signed)
 OPERATIVE NOTE   PROCEDURE: 1.   Left common femoral and superficial femoral artery endarterectomies and patch angioplasty 2.   Ultrasound guidance for vascular access to the right common femoral artery with 7 French sheath 3.   Mechanical thrombectomy to the left external and common iliac arteries using the 8 French Rota Rex thrombectomy device 4.   Percutaneous stent placement to the right common iliac artery with 9 mm diameter by 38 mm length Lifestream stent postdilated to 10 mm 5.   Stent placement to the left common iliac artery with 9 mm diameter by 58 mm length Lifestream stent 6.   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  significant plaque in left common femoral and proximal superficial femoral arteries 2.  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  Occlusion with thrombosis of the left common and external iliac arteries and left common femoral artery with previous stents in place but occluded  SPECIMEN(S):  Left common femoral and superficial femoral artery plaque.  INDICATIONS:    Patient presents with thrombotic occlusion of his left iliac stents with stents down to the left common femoral artery which have thrombosed and occluded.  Left femoral endarterectomy as well as significant iliac artery intervention is planned to try to improve perfusion.  The risks and benefits as well as alternative therapies including intervention were reviewed in detail all questions were answered the patient agrees to proceed with surgery.  DESCRIPTION: After obtaining full informed written consent,  the patient was brought back to the operating room and placed supine upon the operating table.  The patient received IV antibiotics prior to induction.  After obtaining adequate anesthesia, the patient was prepped and draped in the standard fashion appropriate time out is called.    Vertical incision was created overlying the left femoral arteries. The common femoral artery proximally, and superficial femoral artery, and primary profunda femoris artery branches were encircled with vessel loops and prepared for control. The left femoral arteries were found to have significant plaque from the common femoral artery into the profunda and superficial femoral arteries.   6000 units of heparin  was given and allowed circulate for 5 minutes.   Attention is then turned to the left femoral artery.  An arteriotomy is made with 11 blade and extended with Potts scissors in the common femoral artery and carried down onto the first 1-2 cm of the superficial femoral artery. An endarterectomy was then performed. The [facility] was used to create a plane.  The existing thrombosed stent was identified and removed from the left common femoral artery. This was in the proximal common femoral artery and most distal external iliac artery.  The origin of the profunda femoris artery was protected from harm during the endarterectomy and the endarterectomy was carried down on the first 2 cm or so of the superficial femoral artery.  The distal endpoint of the superficial femoral artery endarterectomy was created with gentle traction and the distal endpoint was clean.  The bovine pericardial patcth is then selected and prepared for a patch angioplasty.  It is cut and beveled and  started at the distal endpoint with a 6-0 Prolene suture.  Approximately one half of the suture line is run medially and laterally and the distal end point was cut and bevelled to match the arteriotomy.   Attention was then turned to the endovascular  portion of the procedure.  The proximal portion of the arteriotomy was left open since there was an occlusion in the iliac artery.  The right femoral artery was accessed under direct ultrasound guidance by Dr. Jama using a Seldinger needle.  A J-wire and 7 French sheath were placed and a permanent image was recorded.  I placed an 8 French sheath up the left side.  I was able to cross the occlusion with minimal difficulty in the left common and external iliac arteries with a Kumpe catheter and advantage wire and confirm intraluminal flow in the aorta.  A V18 wire was then placed.  While Dr. Jama inflated a balloon in the right common iliac artery 8 mm in diameter to protect the right side for many thrombus that was liberated, I perform mechanical thrombectomy to the thrombotic occlusion of the previously placed stents in the left common and external iliac arteries.  4 passes were made using the Rota Rex thrombectomy device in the left common and external iliac arteries with improvement and significant reduction in the thrombotic occlusion but there remained significant residual stenosis both in the common and the external iliac arteries.  We then placed kissing balloon expandable stents in the common iliac arteries up to the distal aorta to address both the right common iliac artery stenosis as well as performed kissing balloon expandable stents to protect the right side when reopening the left.  The stents were 9 mm diameter by 58 mm length on the left and 9 mm diameter by 38 mm length on the right.  On the right, we also postdilated to 10 mm in the Lifestream stent.  For the significant residual disease in the left external iliac artery, another 9 mm diameter by 58 mm length Lifestream stent followed by a 9 mm diameter by 5 cm length Viabahn stent were then deployed.  The Viabahn stent was postdilated with an 8 mm diameter balloon.  Completion imaging following stent placement on each side showed no  significant residual stenosis with brisk flow through both iliac systems.  The sheath and wire removed from the left side.  On the right side, StarClose closure device was deployed with excellent hemostatic result in the right common femoral artery.  After completion of the endovascular portion of the procedure, attention was turned back to closure of the left femoral artery.  The vessel was flushed proximally to remove any debris.  It was then clamped proximally with a profunda clamp.  A second 6-0 Prolene was started at the small end point and run to the mid portion to complete the arteriotomy.  The vessel was flushed prior to release of control and completion of the anastomosis.  At this point, flow was established first to the profunda femoris artery and then to the superficial femoral artery. Easily palpable pulses are noted well beyond the anastomosis and both arteries.  Fibrillar and Hemoblast topical hemostatic agents were placed in the femoral incision and hemostasis was complete. Vashe irrigation was then used and Stimulan vancomycin  and gentamicin  impregnated antibiotic beads were placed in the wound.  The femoral incision was then closed in a layered fashion with 2 layers of 2-0 Vicryl, 2 layers of 3-0 Vicryl, and 4-0 Monocryl for  the skin closure. Dermabond and honeycomb sterile dressing were then placed over the incision.  The patient was then awakened from anesthesia and taken to the recovery room in stable condition having tolerated the procedure well.  COMPLICATIONS: None  CONDITION: Stable     Selinda Gu 06/30/2023 3:23 PM   This note was created with Dragon Medical transcription system. Any errors in dictation are purely unintentional.

## 2023-06-30 NOTE — Plan of Care (Signed)
  Problem: Education: Goal: Knowledge of General Education information will improve Description: Including pain rating scale, medication(s)/side effects and non-pharmacologic comfort measures Outcome: Progressing   Problem: Health Behavior/Discharge Planning: Goal: Ability to manage health-related needs will improve Outcome: Progressing   Problem: Clinical Measurements: Goal: Ability to maintain clinical measurements within normal limits will improve Outcome: Progressing Goal: Will remain free from infection Outcome: Progressing Goal: Diagnostic test results will improve Outcome: Progressing Goal: Respiratory complications will improve Outcome: Progressing Goal: Cardiovascular complication will be avoided Outcome: Progressing   Problem: Activity: Goal: Risk for activity intolerance will decrease Outcome: Progressing   Problem: Nutrition: Goal: Adequate nutrition will be maintained Outcome: Progressing   Problem: Coping: Goal: Level of anxiety will decrease Outcome: Progressing   Problem: Elimination: Goal: Will not experience complications related to bowel motility Outcome: Progressing Goal: Will not experience complications related to urinary retention Outcome: Progressing   Problem: Pain Managment: Goal: General experience of comfort will improve and/or be controlled Outcome: Progressing   Problem: Safety: Goal: Ability to remain free from injury will improve Outcome: Progressing   Problem: Skin Integrity: Goal: Risk for impaired skin integrity will decrease Outcome: Progressing   Problem: Education: Goal: Knowledge of prescribed regimen will improve Outcome: Progressing   Problem: Activity: Goal: Ability to tolerate increased activity will improve Outcome: Progressing   Problem: Bowel/Gastric: Goal: Gastrointestinal status for postoperative course will improve Outcome: Progressing   Problem: Clinical Measurements: Goal: Postoperative complications  will be avoided or minimized Outcome: Progressing Goal: Signs and symptoms of graft occlusion will improve Outcome: Progressing   Problem: Skin Integrity: Goal: Demonstration of wound healing without infection will improve Outcome: Progressing   Problem: Education: Goal: Ability to describe self-care measures that may prevent or decrease complications (Diabetes Survival Skills Education) will improve Outcome: Progressing Goal: Individualized Educational Video(s) Outcome: Progressing   Problem: Coping: Goal: Ability to adjust to condition or change in health will improve Outcome: Progressing   Problem: Fluid Volume: Goal: Ability to maintain a balanced intake and output will improve Outcome: Progressing   Problem: Health Behavior/Discharge Planning: Goal: Ability to identify and utilize available resources and services will improve Outcome: Progressing Goal: Ability to manage health-related needs will improve Outcome: Progressing   Problem: Metabolic: Goal: Ability to maintain appropriate glucose levels will improve Outcome: Progressing   Problem: Nutritional: Goal: Maintenance of adequate nutrition will improve Outcome: Progressing Goal: Progress toward achieving an optimal weight will improve Outcome: Progressing   Problem: Skin Integrity: Goal: Risk for impaired skin integrity will decrease Outcome: Progressing   Problem: Tissue Perfusion: Goal: Adequacy of tissue perfusion will improve Outcome: Progressing

## 2023-06-30 NOTE — Anesthesia Procedure Notes (Signed)
 Procedure Name: Intubation Date/Time: 06/30/2023 11:26 AM  Performed by: Niki Manus SAUNDERS, CRNAPre-anesthesia Checklist: Patient identified, Patient being monitored, Timeout performed, Emergency Drugs available and Suction available Patient Re-evaluated:Patient Re-evaluated prior to induction Oxygen Delivery Method: Circle system utilized Preoxygenation: Pre-oxygenation with 100% oxygen Induction Type: IV induction Ventilation: Mask ventilation without difficulty Laryngoscope Size: Mac and 4 Grade View: Grade I Tube type: Oral Tube size: 7.0 mm Number of attempts: 1 Airway Equipment and Method: Stylet Placement Confirmation: ETT inserted through vocal cords under direct vision, positive ETCO2 and breath sounds checked- equal and bilateral Secured at: 21 cm Tube secured with: Tape Dental Injury: Teeth and Oropharynx as per pre-operative assessment

## 2023-06-30 NOTE — Op Note (Signed)
 OPERATIVE NOTE   PROCEDURE: Left common femoral and superficial femoral endarterectomy with bovine pericardial patch angioplasty. Percutaneous transluminal angioplasty and stent placement right common iliac artery. Ultrasound-guided access to the right common femoral artery with placement of a 7 French sheath. Mechanical thrombectomy of the left external and common iliac arteries using the 8 French Rota Rex thrombectomy device. Open angioplasty and stent placement of the left common iliac artery. 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  ASSISTANT(S): None  ANESTHESIA: general  ESTIMATED BLOOD LOSS: 500 cc  FINDING(S): Profound calcific plaque noted of the left common femoral extending past the origin of the SFA. Greater than 70% stenosis of the right common iliac artery. Occlusion of the left common and external iliac arteries as well as the left common femoral artery with prior stent placement.  SPECIMEN(S):  Calcific plaque from the common femoral, superficial femoral and the profunda femoris artery  INDICATIONS:   Devin Mayer 68 y.o. y.o.male who presents with complaints of lifestyle limiting claudication and pain continuously in the left lower extremity. The patient has documented severe atherosclerotic occlusive disease and has undergone minimally invasive treatments in the past. However, at this point his primary area of stricture stenosis resides in the common femoral and origins of the superficial femoral and profunda femoris extending into these arteries and therefore this is not amenable to intervention. The patient is therefore undergoing open endarterectomy. The  risks and benefits of surgery have been reviewed with the patient, all questions have answered; alternative therapies have been reviewed as well and the patient has agreed to proceed with surgical open repair.  DESCRIPTION: After obtaining full informed written consent, the patient was brought back to the operating room and placed supine upon the operating table.  The patient received IV antibiotics prior to induction.  After obtaining adequate anesthesia, the patient was prepped and draped in the standard fashion for left femoral exposure.  Co-surgeons are required because this is a complicated procedure with work being performed simultaneously by both surgeons.  This expedites the procedure making a shorter operative time reducing complications and improving patient safety.  Attention was turned to the left groin with Dr. Gu working on the patient's left and myself working on the right of the patient.  Vertical  Incision was made over the left common femoral artery and dissection carried down to the common femoral artery with electrocautery.  I dissected out the common femoral artery from the distal external iliac artery (identified by the superficial circumflex vessels) down to the femoral bifurcation.  On initial inspection, the common femoral artery was: densely calcified and there was no palpable pulse noted.    Subsequently the dissection was continued to include all circumflex branches and the profunda femoral artery and superficial femoral artery. The superficial femoral artery was dissected circumferentially for a distance of approximately 3-4 cm and the profunda femoris was dissected circumferentially out to the fourth order branches individual vessel loops were placed around each branch.  Control of all branches was obtained with vessel loops.  A softer area in the distal external iliac artery amendable to clamping was identified.    The patient was given 6000 units of Heparin  intravenously,  which was a therapeutic bolus.   After waiting 3 minutes, the distal external iliac  artery was clamped and all of the vessel loops were placed under tension.  Arteriotomy was made in the common femoral artery with a 11-blade and extended it with a Potts scissor proximally and distally extending the distal end down the SFA for approximately 3 cm.   The previously placed Viabahn stent which was occluded and extended into the common femoral by 1 to 2 cm was then removed in 1 piece.  Endarterectomy was then performed under direct visualization using a freer elevator and a right angle from the mid common femoral extending up both proximally and distally. Proximally the endarterectomy was brought up to the level of the clamp where a clean edge was obtained. Distally the endarterectomy was carried down to a soft spot in the SFA where a feathered edge would was obtained.    At this point, a bovine pericardial patch was fashioned for the geometry of the arteriotomy.  Beginning at the level of the SFA a 6-0 Prolene was used to sew the patch to the arteriotomy.  Once we reached the halfway point laterally we sewed the suture line medially.  At this point we turned to the interventional portion of the case.  Again with Dr. Marea working on the left and myself working on the patient's right.  Ultrasound was placed in a sterile sleeve.  Ultrasound was used to examine the right common femoral artery.  The common femoral artery was echolucent and pulsatile indicating patency.  Images recorded for the permanent record.  Under direct ultrasound visualization Seldinger needle is inserted followed by J-wire followed by a 7 French sheath.  An advantage wire is then negotiated into the abdominal aorta.  On the left side Dr. Marea placed an 8 French sheath over a J-wire through the arteriotomy in the common femoral.  Hand-injection contrast was then performed to demonstrate the occlusion.  Using a 40 cm Kumpe catheter and an advantage  wire he was able to cross the occlusion.  Kumpe catheter was then advanced and hand-injection of contrast was performed verifying intraluminal positioning within the distal aorta.  A 0.018 wire was then advanced through the Kumpe.  An 8 mm x 40 mm balloon was then advanced up the right side and positioned in the common iliac artery and inflated to nominal pressure.  The Rota Rex thrombectomy catheter was prepped on the field advanced over the wire approximately 4 passes were made from the distal external iliac artery into the aorta.  After the above-noted passes we now had pulsatile flow out the common femoral arteriotomy.  The right sided balloon was then deflated and removed.  After appropriate measurements a 9 mm x 38 mm Lifestream stent was selected for the right side and a 9 mm x 58 mm Lifestream stent was selected for the left side.  Magnified image of the aortic bifurcation was obtained and then the stents were both positioned simultaneously deployed in the kissing balloon fashion elevating the bifurcation approximately 5 mm.  Follow-up imaging on the right demonstrated that the stent was slightly undersized and a 10 mm x 40 mm balloon was used to post dilate the stent inflated to 8 atm for approximately 1 minute.  On the left side a second 9 mm x 58 mm Lifestream stent was then used to extend the iliac intervention essentially covering the entire common iliac and extending into the external iliac artery.  Next a 9 mm x 5 cm Viabahn stent was advanced up the left side and deployed so that the  distal edge of the stent was right at the proximal margin of the patch.  Viabahn stent was then postdilated with an 8 mm balloon inflated to 10 atm for approximately 1 minute.  Follow-up imaging on both iliac reconstructions demonstrated wide patency with less than 10% residual stenosis but at this point we elected to complete the arteriotomy and closed the case.  The sheaths and wires were then removed and a StarClose  was deployed on the right side and a vascular control was obtained on the left side with a profunda clamp.  The proximal half of the pericardial patch was sewn to the artery with a running stitch of 6-0 Prolene, running from each end.  Prior to completing the patch angioplasty, the profunda femoral artery was flushed as was the superficial femoral artery. The system was then forward flushed. The endarterectomy site was then irrigated copiously with heparinized saline. The patch angioplasty was completed in the usual fashion.  Flow was then reestablished first to the profunda femoris and then the superficial femoral artery. Any gaps or bleeding sites in the suture line were easily controlled with a 6-0 Prolene suture.   The left groin was then irrigated copiously with Voshe and subsequently hemoblast and antibiotic beads were placed in the wound.  The antibiotic beads were reconstituted with 1 g of vancomycin  and 80 mg of gentamicin  additionally.  The incision was repaired with a double layer of 2-0 Vicryl, a double layer of 3-0 Vicryl, and a Monocryl subcuticular was then used to close the skin with Dermabond and a honeycomb dressing.  COMPLICATIONS: None  CONDITION: Metta Cordella JUDITHANN Jama, M.D.  [facility] Office: [phone]  06/30/2023, 3:14 PM

## 2023-06-30 NOTE — Transfer of Care (Signed)
 Immediate Anesthesia Transfer of Care Note  Patient: Devin Mayer  Procedure(s) Performed: ENDARTERECTOMY FEMORAL (Left) INSERTION OF ILIAC STENT (Bilateral) APPLICATION OF CELL SAVER (Left)  Patient Location: PACU  Anesthesia Type:General  Level of Consciousness: awake, drowsy, and patient cooperative  Airway & Oxygen Therapy: Patient Spontanous Breathing and Patient connected to face mask oxygen  Post-op Assessment: Report given to RN and Post -op Vital signs reviewed and stable  Post vital signs: Reviewed and stable  Last Vitals:  Vitals Value Taken Time  BP 127/61 06/30/23 1506  Temp 97.1   Pulse 96 06/30/23 1509  Resp 18 06/30/23 1509  SpO2 100 % 06/30/23 1509  Vitals shown include unfiled device data.  Last Pain:  Vitals:   06/30/23 1011  TempSrc: Temporal  PainSc: 5          Complications: No notable events documented.

## 2023-06-30 NOTE — Anesthesia Postprocedure Evaluation (Signed)
 Anesthesia Post Note  Patient: Devin Mayer  Procedure(s) Performed: ENDARTERECTOMY FEMORAL (Left) INSERTION OF ILIAC STENT (Bilateral) APPLICATION OF CELL SAVER (Left)  Patient location during evaluation: PACU Anesthesia Type: General Level of consciousness: awake and alert Pain management: pain level controlled Vital Signs Assessment: post-procedure vital signs reviewed and stable Respiratory status: spontaneous breathing, nonlabored ventilation, respiratory function stable and patient connected to nasal cannula oxygen Cardiovascular status: blood pressure returned to baseline and stable Postop Assessment: no apparent nausea or vomiting Anesthetic complications: no   No notable events documented.   Last Vitals:  Vitals:   06/30/23 1600 06/30/23 1615  BP: 117/60 112/60  Pulse: 90 92  Resp: 12 13  Temp:    SpO2: 95% 94%    Last Pain:  Vitals:   06/30/23 1615  TempSrc:   PainSc: 0-No pain                 [name]

## 2023-06-30 NOTE — Anesthesia Preprocedure Evaluation (Signed)
 Anesthesia Evaluation  Patient identified by MRN, date of birth, ID band Patient awake    Reviewed: Allergy & Precautions, NPO status , Patient's Chart, lab work & pertinent test results  History of Anesthesia Complications Negative for: history of anesthetic complications  Airway Mallampati: II  TM Distance: >3 FB Neck ROM: Full   Comment: Large beard Dental  (+) Edentulous Upper, Edentulous Lower   Pulmonary neg sleep apnea, COPD,  COPD inhaler, Current Smoker and Patient abstained from smoking.   Pulmonary exam normal breath sounds clear to auscultation       Cardiovascular Exercise Tolerance: Good METShypertension, Pt. on medications + CAD and + Peripheral Vascular Disease  (-) Past MI (-) dysrhythmias  Rhythm:Regular Rate:Normal - Systolic murmurs Stress echo 2024 with normal function, no ischemia evidence   Neuro/Psych  PSYCHIATRIC DISORDERS  Depression    Stroke listed in chart; patient denies CVA    GI/Hepatic ,GERD  Controlled,,(+)     (-) substance abuse    Endo/Other  diabetes, Type 2, Insulin  Dependent    Renal/GU CRFRenal disease     Musculoskeletal   Abdominal   Peds  Hematology   Anesthesia Other Findings Past Medical History: 2015: Acute bronchitis No date: Adrenal mass (HCC) No date: Atherosclerosis of native arteries of extremities with  gangrene, bilateral legs (HCC) No date: Bilateral carotid artery stenosis No date: Cancer [facility]) No date: Chronic pain syndrome 2022: Chronic radicular lumbar pain No date: COPD (chronic obstructive pulmonary disease) (HCC) No date: Coronary artery disease No date: Degeneration of thoracic intervertebral disc No date: Depression No date: Diabetes mellitus without complication (HCC) No date: Dyspnea No date: GERD (gastroesophageal reflux disease) No date: Helicobacter pylori infection 2021: Hematochezia No date: Hyperlipidemia No date:  Hypertension 2015: Influenza B 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 No date: Intestinal metaplasia of gastric mucosa No date: Lumbar facet joint syndrome No date: Nephrolithiasis No date: Neuroforaminal stenosis of lumbar spine No date: Other intervertebral disc degeneration, lumbar region with  discogenic back pain only No date: Pre-diabetes 2015: Prostate cancer (HCC) No date: Smoking No date: Stroke [facility]) No date: Subcutaneous nodule No date: Type 2 diabetes mellitus with diabetic nephropathy, with  long-term current use of insulin  (HCC) No date: Vertigo 2021: Volume overload  Reproductive/Obstetrics                             Anesthesia Physical Anesthesia Plan  ASA: 3  Anesthesia Plan: General   Post-op Pain Management: Ofirmev  IV (intra-op)*   Induction: Intravenous  PONV Risk Score and Plan: 2 and Ondansetron , Dexamethasone , Midazolam  and Treatment may vary due to age or medical condition  Airway Management Planned: Oral ETT  Additional Equipment: Arterial line  Intra-op Plan:   Post-operative Plan: Extubation in OR  Informed Consent: I have reviewed the patients History and Physical, chart, labs and discussed the procedure including the risks, benefits and alternatives for the proposed anesthesia with the patient or authorized representative who has indicated his/her understanding and acceptance.     Dental advisory given  Plan Discussed with: CRNA and Surgeon  Anesthesia Plan Comments: (Discussed risks of anesthesia with patient, including PONV, sore throat, lip/dental/eye damage. Rare risks discussed as well, such as cardiorespiratory and neurological sequelae, and allergic reactions. Discussed potential arterial line access. Discussed the role of CRNA in patient's perioperative care. Patient understands. Patient counseled on benefits of smoking cessation, and increased perioperative risks associated with continued smoking. )  Anesthesia Quick Evaluation

## 2023-06-30 NOTE — Interval H&P Note (Signed)
 History and Physical Interval Note:  06/30/2023 11:07 AM  Devin Mayer  has presented today for surgery, with the diagnosis of ASO WITH REST PAIN.  The various methods of treatment have been discussed with the patient and family. After consideration of risks, benefits and other options for treatment, the patient has consented to  Procedure(s): ENDARTERECTOMY FEMORAL (Left) INSERTION OF ILIAC STENT (Bilateral) APPLICATION OF CELL SAVER (Left) as a surgical intervention.  The patient's history has been reviewed, patient examined, no change in status, stable for surgery.  I have reviewed the patient's chart and labs.  Questions were answered to the patient's satisfaction.     Ric Rosenberg

## 2023-07-01 ENCOUNTER — Encounter: Payer: Self-pay | Admitting: Vascular Surgery

## 2023-07-01 LAB — BASIC METABOLIC PANEL
Anion gap: 9 (ref 5–15)
BUN: 21 mg/dL (ref 8–23)
CO2: 25 mmol/L (ref 22–32)
Calcium: 8.2 mg/dL — ABNORMAL LOW (ref 8.9–10.3)
Chloride: 101 mmol/L (ref 98–111)
Creatinine, Ser: 1.26 mg/dL — ABNORMAL HIGH (ref 0.61–1.24)
GFR, Estimated: >60 mL/min (ref >60)
Glucose, Bld: 214 mg/dL — ABNORMAL HIGH (ref 70–99)
Potassium: 3.3 mmol/L — ABNORMAL LOW (ref 3.5–5.1)
Sodium: 135 mmol/L (ref 135–145)

## 2023-07-01 LAB — CBC
HCT: 39 % (ref 39.0–52.0)
Hemoglobin: 14.1 g/dL (ref 13.0–17.0)
MCH: 32.4 pg (ref 26.0–34.0)
MCHC: 36.2 g/dL — ABNORMAL HIGH (ref 30.0–36.0)
MCV: 89.7 fL (ref 80.0–100.0)
Platelets: 185 10*3/uL (ref 150–400)
RBC: 4.35 MIL/uL (ref 4.22–5.81)
RDW: 12.5 % (ref 11.5–15.5)
WBC: 9.6 10*3/uL (ref 4.0–10.5)
nRBC: 0 % (ref 0.0–0.2)

## 2023-07-01 LAB — GLUCOSE, CAPILLARY
Glucose-Capillary: 186 mg/dL — ABNORMAL HIGH (ref 70–99)
Glucose-Capillary: 166 mg/dL — ABNORMAL HIGH (ref 70–99)
Glucose-Capillary: 183 mg/dL — ABNORMAL HIGH (ref 70–99)
Glucose-Capillary: 231 mg/dL — ABNORMAL HIGH (ref 70–99)

## 2023-07-01 LAB — HEMOGLOBIN A1C
Hgb A1c MFr Bld: 7.9 % — ABNORMAL HIGH (ref 4.8–5.6)
Mean Plasma Glucose: 180.03 mg/dL

## 2023-07-01 MED ORDER — SODIUM CHLORIDE 0.9% FLUSH: 3.0000 mL | INTRAVENOUS | Status: DC | PRN

## 2023-07-01 MED ORDER — SODIUM CHLORIDE 0.9% FLUSH
3.0000 mL | Freq: Two times a day (BID) | INTRAVENOUS | Status: DC
  Administered 2023-07-01 (×2): 10 mL via INTRAVENOUS
  Administered 2023-07-02: 3 mL via INTRAVENOUS
  Administered 2023-07-02: 10 mL via INTRAVENOUS
  Administered 2023-07-03: 3 mL via INTRAVENOUS
  Administered 2023-07-03 – 2023-07-05 (×4): 10 mL via INTRAVENOUS

## 2023-07-01 MED ORDER — CHLORHEXIDINE GLUCONATE CLOTH 2 % EX PADS
6.0000 | MEDICATED_PAD | Freq: Every day | CUTANEOUS | Status: DC
  Administered 2023-07-01 – 2023-07-02 (×2): 6 via TOPICAL

## 2023-07-01 NOTE — Progress Notes (Signed)
 Progress Note    07/01/2023 8:00 AM 1 Day Post-Op  Subjective:  Devin Mayer is a 68 yo male now POD #1 from Left common femoral and superficial femoral artery endarterectomies and patch angioplasty.  Mechanical thrombectomy to the left external and common iliac arteries using the 8 French Rota Rex thrombectomy device. Percutaneous stent placement to the right common iliac artery with 9 mm diameter by 38 mm length. Stent placement to the left common iliac artery with 9 mm diameter by 58 mm length. Additional stent placement to the left external iliac artery with 9 mm diameter by 58 mm length.   On exam this morning patient is resting comfortably in bed.  Dressing to left groin is clean dry and intact.  Patient has strong pulses via Doppler PT/DP to left foot.  Good Capillary refill.  Patient endorses very little pain.  Vitals all remained stable.   Vitals:   07/01/23 0700 07/01/23 0752  BP: 112/61   Pulse: 83   Resp: 15   Temp:  97.9 F (36.6 C)  SpO2: 96%    Physical Exam: Cardiac:  RRR, Normal S1, S2.  No murmurs appreciated. Lungs: Lungs clear on auscultation but diminished in the bases.  No rales rhonchi or wheezing noted.   Incisions:  Left groin incision with dressing clean dry and intact. Extremities: Bilateral lower extremities warm to touch.  Right lower extremity palpable DP PT pulses.  Left lower extremity strong Doppler DP PT pulses.  Good capillary refill Abdomen: Positive bowel sounds throughout, soft, nontender nondistended. Neurologic: Alert and oriented x 3, answers all questions and follows commands appropriately.  CBC    Component Value Date/Time   WBC 9.6 07/01/2023 0127   RBC 4.35 07/01/2023 0127   HGB 14.1 07/01/2023 0127   HGB 16.0 04/13/2013 1040   HCT 39.0 07/01/2023 0127   HCT 46.6 04/13/2013 1040   PLT 185 07/01/2023 0127   PLT 234 04/13/2013 1040   MCV 89.7 07/01/2023 0127   MCV 95 04/13/2013 1040   MCH 32.4 07/01/2023 0127   MCHC 36.2 (H)  07/01/2023 0127   RDW 12.5 07/01/2023 0127   RDW 12.7 04/13/2013 1040   LYMPHSABS 2.0 06/23/2023 1156   MONOABS 0.6 06/23/2023 1156   EOSABS 0.3 06/23/2023 1156   BASOSABS 0.1 06/23/2023 1156    BMET    Component Value Date/Time   NA 135 07/01/2023 0127   NA 137 04/13/2013 1040   K 3.3 (L) 07/01/2023 0127   K 4.3 04/13/2013 1040   CL 101 07/01/2023 0127   CL 107 04/13/2013 1040   CO2 25 07/01/2023 0127   CO2 25 04/13/2013 1040   GLUCOSE 214 (H) 07/01/2023 0127   GLUCOSE 155 (H) 04/13/2013 1040   BUN 21 07/01/2023 0127   BUN 15 04/13/2013 1040   CREATININE 1.26 (H) 07/01/2023 0127   CREATININE 1.20 07/02/2021 1522   CALCIUM 8.2 (L) 07/01/2023 0127   CALCIUM 9.3 04/13/2013 1040   GFRNONAA >60 07/01/2023 0127   GFRNONAA >60 04/13/2013 1040   GFRAA >60 12/19/2016 0113   GFRAA >60 04/13/2013 1040    INR No results found for: INR   Intake/Output Summary (Last 24 hours) at 07/01/2023 0800 Last data filed at 07/01/2023 9247 Gross per 24 hour  Intake 1914.82 ml  Output 2050 ml  Net -135.18 ml     Assessment/Plan:  67 y.o. male is s/p left femoral endarterectomy with multiple stent placements and mechanical thrombectomy to left external and common iliac arteries.  1 Day Post-Op   Plan Start ASA 81 mg Daily and Plavix  75 mg daily and Pravachol  40 mg daily.  Discontinue Celebrex  for now. Continue Lovenox  40 mg daily until ambulating well.  Advance diet as tolerated Pain medication Prn PT/OT  OOB with assists.   DVT prophylaxis:  Lovenox  40 mg Sq Q24   Shamarie Call R Devin [facility] 07/01/2023 8:00 AM

## 2023-07-01 NOTE — Progress Notes (Signed)
 Made Vascular aware brian pace, potassium this am is 3.3

## 2023-07-01 NOTE — Evaluation (Signed)
 Physical Therapy Evaluation Patient Details Name: Devin Mayer MRN: [id number] DOB: 1955/10/21 Today's Date: 07/01/2023  History of Present Illness  Patient is a 68 year old male s/p left femoral endarterectomy with stent placement. History of COPD, HTN, PVD  Clinical Impression  Patient is agreeable to PT evaluation. He reports he lives with a significant other in a mobile home with 5 steps to enter. He is Modified independent at baseline and uses a cane or rolling walker for ambulation at home.  Gait training initiated today. Patient walked in hallway with rolling walker with improving gait pattern with increased ambulation distance. Sp02 in the 90's on room air. Patient is hopeful to discharge home soon. No anticipated PT needs after this hospital stay. PT will follow while in the hospital to maximize independence.       If plan is discharge home, recommend the following: Help with stairs or ramp for entrance;Assist for transportation   Can travel by private vehicle        Equipment Recommendations None recommended by PT  Recommendations for Other Services       Functional Status Assessment Patient has had a recent decline in their functional status and demonstrates the ability to make significant improvements in function in a reasonable and predictable amount of time.     Precautions / Restrictions Precautions Precautions: Fall Restrictions Weight Bearing Restrictions Per Provider Order: No      Mobility  Bed Mobility Overal bed mobility: Needs Assistance Bed Mobility: Supine to Sit     Supine to sit: Supervision          Transfers Overall transfer level: Needs assistance Equipment used: Rolling walker (2 wheels) Transfers: Sit to/from Stand Sit to Stand: Contact guard assist           General transfer comment: cues for hand placement for safety    Ambulation/Gait Ambulation/Gait assistance: Contact guard assist Gait Distance (Feet): 75 Feet Assistive  device: Rolling walker (2 wheels) Gait Pattern/deviations: Step-to pattern, Step-through pattern, Antalgic, Decreased stride length, Decreased stance time - left Gait velocity: decreased     General Gait Details: patient walked in hallway with initial step-to, antalgic gait pattern than improved with increased ambulation distance. cues to use the rolling walker to off load the LLE with weight bearing. Sp02 in the mid 90's on room air with activity  Stairs            Wheelchair Mobility     Tilt Bed    Modified Rankin (Stroke Patients Only)       Balance Overall balance assessment: Needs assistance Sitting-balance support: Feet supported Sitting balance-Leahy Scale: Fair     Standing balance support: Bilateral upper extremity supported Standing balance-Leahy Scale: Fair Standing balance comment: using rolling walker for support in standing                             Pertinent Vitals/Pain Pain Assessment Pain Assessment: Faces Faces Pain Scale: Hurts little more Pain Location: L groin and L wrist at IV site Pain Descriptors / Indicators: Discomfort Pain Intervention(s): Monitored during session, Limited activity within patient's tolerance    Home Living Family/patient expects to be discharged to:: Private residence Living Arrangements: Spouse/significant other Available Help at Discharge: Family Type of Home: Mobile home Home Access: Stairs to enter Entrance Stairs-Rails: Doctor, General Practice of Steps: 5   Home Layout: One level Home Equipment: Agricultural Consultant (2 wheels);Cane - single point  Prior Function Prior Level of Function : Independent/Modified Independent             Mobility Comments: using cane or rolling walker for ambulation       Extremity/Trunk Assessment   Upper Extremity Assessment Upper Extremity Assessment: Overall WFL for tasks assessed    Lower Extremity Assessment Lower Extremity Assessment: LLE  deficits/detail LLE Deficits / Details: pain with weight bearing initially. no knee buckling with weight bearing       Communication   Communication Communication: No apparent difficulties  Cognition Arousal: Alert Behavior During Therapy: WFL for tasks assessed/performed Overall Cognitive Status: Within Functional Limits for tasks assessed                                          General Comments      Exercises     Assessment/Plan    PT Assessment Patient needs continued PT services  PT Problem List Decreased activity tolerance;Decreased range of motion;Decreased balance;Decreased mobility;Decreased knowledge of use of DME;Pain       PT Treatment Interventions DME instruction;Gait training;Functional mobility training;Stair training;Therapeutic activities;Therapeutic exercise;Balance training;Neuromuscular re-education;Patient/family education    PT Goals (Current goals can be found in the Care Plan section)  Acute Rehab PT Goals Patient Stated Goal: to go home as soon as possible PT Goal Formulation: With patient Time For Goal Achievement: 07/15/23 Potential to Achieve Goals: Good    Frequency Min 1X/week     Co-evaluation               AM-PAC PT 6 Clicks Mobility  Outcome Measure Help needed turning from your back to your side while in a flat bed without using bedrails?: None Help needed moving from lying on your back to sitting on the side of a flat bed without using bedrails?: A Little Help needed moving to and from a bed to a chair (including a wheelchair)?: A Little Help needed standing up from a chair using your arms (e.g., wheelchair or bedside chair)?: A Little Help needed to walk in hospital room?: A Little Help needed climbing 3-5 steps with a railing? : A Little 6 Click Score: 19    End of Session Equipment Utilized During Treatment: Gait belt Activity Tolerance: Patient tolerated treatment well Patient left: in chair;with  call bell/phone within reach Nurse Communication: Mobility status (left the oxygen off, Sp02 in the 90's) PT Visit Diagnosis: Other abnormalities of gait and mobility (R26.89);Difficulty in walking, not elsewhere classified (R26.2)    Time: 9142-9088 PT Time Calculation (min) (ACUTE ONLY): 14 min   Charges:   PT Evaluation $PT Eval Moderate Complexity: 1 Mod   PT General Charges $$ ACUTE PT VISIT: 1 Visit         Randine Essex, PT, MPT   Randine LULLA Essex 07/01/2023, 10:20 AM

## 2023-07-01 NOTE — Plan of Care (Signed)
  Problem: Education: Goal: Knowledge of General Education information will improve Description: Including pain rating scale, medication(s)/side effects and non-pharmacologic comfort measures Outcome: Progressing   Problem: Health Behavior/Discharge Planning: Goal: Ability to manage health-related needs will improve Outcome: Progressing   Problem: Clinical Measurements: Goal: Ability to maintain clinical measurements within normal limits will improve Outcome: Progressing Goal: Will remain free from infection Outcome: Progressing Goal: Diagnostic test results will improve Outcome: Progressing Goal: Respiratory complications will improve Outcome: Progressing Goal: Cardiovascular complication will be avoided Outcome: Progressing   Problem: Activity: Goal: Risk for activity intolerance will decrease Outcome: Progressing   Problem: Nutrition: Goal: Adequate nutrition will be maintained Outcome: Progressing   Problem: Coping: Goal: Level of anxiety will decrease Outcome: Progressing   Problem: Elimination: Goal: Will not experience complications related to bowel motility Outcome: Progressing Goal: Will not experience complications related to urinary retention Outcome: Progressing   Problem: Pain Managment: Goal: General experience of comfort will improve and/or be controlled Outcome: Progressing   Problem: Safety: Goal: Ability to remain free from injury will improve Outcome: Progressing   Problem: Skin Integrity: Goal: Risk for impaired skin integrity will decrease Outcome: Progressing   Problem: Education: Goal: Knowledge of prescribed regimen will improve Outcome: Progressing   Problem: Activity: Goal: Ability to tolerate increased activity will improve Outcome: Progressing   Problem: Bowel/Gastric: Goal: Gastrointestinal status for postoperative course will improve Outcome: Progressing   Problem: Clinical Measurements: Goal: Postoperative complications  will be avoided or minimized Outcome: Progressing Goal: Signs and symptoms of graft occlusion will improve Outcome: Progressing   Problem: Skin Integrity: Goal: Demonstration of wound healing without infection will improve Outcome: Progressing   Problem: Education: Goal: Ability to describe self-care measures that may prevent or decrease complications (Diabetes Survival Skills Education) will improve Outcome: Progressing Goal: Individualized Educational Video(s) Outcome: Progressing   Problem: Coping: Goal: Ability to adjust to condition or change in health will improve Outcome: Progressing   Problem: Fluid Volume: Goal: Ability to maintain a balanced intake and output will improve Outcome: Progressing   Problem: Health Behavior/Discharge Planning: Goal: Ability to identify and utilize available resources and services will improve Outcome: Progressing Goal: Ability to manage health-related needs will improve Outcome: Progressing   Problem: Metabolic: Goal: Ability to maintain appropriate glucose levels will improve Outcome: Progressing   Problem: Nutritional: Goal: Maintenance of adequate nutrition will improve Outcome: Progressing Goal: Progress toward achieving an optimal weight will improve Outcome: Progressing   Problem: Skin Integrity: Goal: Risk for impaired skin integrity will decrease Outcome: Progressing   Problem: Tissue Perfusion: Goal: Adequacy of tissue perfusion will improve Outcome: Progressing

## 2023-07-01 NOTE — Evaluation (Signed)
 Occupational Therapy Evaluation Patient Details Name: Devin Mayer MRN: [id number] DOB: 01-26-1956 Today's Date: 07/01/2023   History of Present Illness Patient is a 68 year old male s/p left femoral endarterectomy with stent placement. History of COPD, HTN, PVD   Clinical Impression   [name] was seen for OT evaluation this date. Prior to hospital admission, pt was MOD I using rollator. Pt lives with girlfriend. Pt currently requires CGA + RW for toilet t/f and standing groomin tasks, poor dynamic balanc eno UE support, educated on importance of RW use. SUPERVISION don underwear, increased time to thread and SUP for balance in standing. Pt would benefit from skilled OT to address noted impairments and functional limitations (see below for any additional details). Upon hospital discharge, recommend no OT follow up.    If plan is discharge home, recommend the following: A little help with walking and/or transfers;A little help with bathing/dressing/bathroom;Help with stairs or ramp for entrance    Functional Status Assessment  Patient has had a recent decline in their functional status and demonstrates the ability to make significant improvements in function in a reasonable and predictable amount of time.  Equipment Recommendations  BSC/3in1    Recommendations for Other Services       Precautions / Restrictions Precautions Precautions: Fall Restrictions Weight Bearing Restrictions Per Provider Order: No      Mobility Bed Mobility               General bed mobility comments: not tested    Transfers Overall transfer level: Needs assistance Equipment used: Rolling walker (2 wheels) Transfers: Sit to/from Stand Sit to Stand: Contact guard assist                  Balance Overall balance assessment: Needs assistance Sitting-balance support: Feet supported Sitting balance-Leahy Scale: Good     Standing balance support: No upper extremity supported Standing  balance-Leahy Scale: Poor                             ADL either performed or assessed with clinical judgement   ADL Overall ADL's : Needs assistance/impaired                                       General ADL Comments: CGA + RW for toilet t/f and standing groomin tasks. SUPERVISION don underwear, increased time to thread and SUP for balance in standing      Pertinent Vitals/Pain Pain Assessment Pain Assessment: 0-10 Pain Score: 5  Pain Location: L groin Pain Descriptors / Indicators: Discomfort Pain Intervention(s): Premedicated before session     Extremity/Trunk Assessment Upper Extremity Assessment Upper Extremity Assessment: Overall WFL for tasks assessed   Lower Extremity Assessment Lower Extremity Assessment: LLE deficits/detail LLE Deficits / Details: AROM limited by groin pain       Communication Communication Communication: No apparent difficulties   Cognition Arousal: Alert Behavior During Therapy: WFL for tasks assessed/performed Overall Cognitive Status: Within Functional Limits for tasks assessed                                                  Home Living Family/patient expects to be discharged to:: Private residence Living Arrangements: Spouse/significant other Available  Help at Discharge: Family Type of Home: Mobile home Home Access: Stairs to enter Entrance Stairs-Number of Steps: 5 Entrance Stairs-Rails: Right;Left Home Layout: One level               Home Equipment: Cane - single point;Rollator (4 wheels)   Additional Comments: girlfriend available PRN      Prior Functioning/Environment Prior Level of Function : Independent/Modified Independent             Mobility Comments: rollator          OT Problem List: Decreased range of motion;Decreased activity tolerance;Impaired balance (sitting and/or standing)      OT Treatment/Interventions: Self-care/ADL training;Therapeutic  exercise;Energy conservation;DME and/or AE instruction;Therapeutic activities;Balance training;Patient/family education    OT Goals(Current goals can be found in the care plan section) Acute Rehab OT Goals Patient Stated Goal: to go home OT Goal Formulation: With patient Time For Goal Achievement: 07/15/23 Potential to Achieve Goals: Good ADL Goals Pt Will Perform Grooming: Independently;standing Pt Will Perform Lower Body Dressing: Independently;sit to/from stand Pt Will Transfer to Toilet: Independently;ambulating;regular height toilet  OT Frequency: Min 1X/week    Co-evaluation              AM-PAC OT 6 Clicks Daily Activity     Outcome Measure Help from another person eating meals?: None Help from another person taking care of personal grooming?: None Help from another person toileting, which includes using toliet, bedpan, or urinal?: A Little Help from another person bathing (including washing, rinsing, drying)?: A Little Help from another person to put on and taking off regular upper body clothing?: None Help from another person to put on and taking off regular lower body clothing?: A Little 6 Click Score: 21   End of Session Equipment Utilized During Treatment: Rolling walker (2 wheels) Nurse Communication: Mobility status  Activity Tolerance: Patient tolerated treatment well Patient left: in chair;with call bell/phone within reach  OT Visit Diagnosis: Unsteadiness on feet (R26.81)                Time: 1047-1100 OT Time Calculation (min): 13 min Charges:  OT General Charges $OT Visit: 1 Visit OT Evaluation $OT Eval Low Complexity: 1 Low  Elston Slot, M.S. OTR/L  07/01/23, 11:12 AM  ascom [phone]

## 2023-07-02 LAB — SURGICAL PATHOLOGY

## 2023-07-02 LAB — GLUCOSE, CAPILLARY
Glucose-Capillary: 137 mg/dL — ABNORMAL HIGH (ref 70–99)
Glucose-Capillary: 181 mg/dL — ABNORMAL HIGH (ref 70–99)
Glucose-Capillary: 190 mg/dL — ABNORMAL HIGH (ref 70–99)
Glucose-Capillary: 153 mg/dL — ABNORMAL HIGH (ref 70–99)

## 2023-07-02 NOTE — Plan of Care (Signed)
  Problem: Education: Goal: Knowledge of General Education information will improve Description: Including pain rating scale, medication(s)/side effects and non-pharmacologic comfort measures Outcome: Progressing   Problem: Health Behavior/Discharge Planning: Goal: Ability to manage health-related needs will improve Outcome: Progressing   Problem: Clinical Measurements: Goal: Ability to maintain clinical measurements within normal limits will improve Outcome: Progressing Goal: Will remain free from infection Outcome: Progressing Goal: Diagnostic test results will improve Outcome: Progressing Goal: Respiratory complications will improve Outcome: Progressing Goal: Cardiovascular complication will be avoided Outcome: Progressing   Problem: Pain Managment: Goal: General experience of comfort will improve and/or be controlled Outcome: Progressing   Problem: Coping: Goal: Level of anxiety will decrease Outcome: Progressing   Problem: Safety: Goal: Ability to remain free from injury will improve Outcome: Progressing   Problem: Bowel/Gastric: Goal: Gastrointestinal status for postoperative course will improve Outcome: Progressing   Problem: Activity: Goal: Ability to tolerate increased activity will improve Outcome: Progressing   Problem: Skin Integrity: Goal: Demonstration of wound healing without infection will improve Outcome: Progressing

## 2023-07-02 NOTE — TOC Initial Note (Signed)
 Transition of Care [facility]) - Initial/Assessment Note    Patient Details  Name: Devin Mayer MRN: [id number] Date of Birth: 05/04/1956  Transition of Care [facility]) CM/SW Contact:    Lauraine JAYSON Carpen, LCSW Phone Number: 07/02/2023, 4:15 PM  Clinical Narrative:   CSW met with patient. No supports at bedside. CSW introduced role and explained that DME recommendations would be discussed. Patient is agreeable to 3-in-1. Will order closer to discharge. No further concerns. His significant other will transport him home at discharge.               Expected Discharge Plan: Home/Self Care Barriers to Discharge: Continued Medical Work up   Patient Goals and CMS Choice            Expected Discharge Plan and Services     Post Acute Care Choice: Durable Medical Equipment Living arrangements for the past 2 months: Mobile Home                                      Prior Living Arrangements/Services Living arrangements for the past 2 months: Mobile Home Lives with:: Significant Other Patient language and need for interpreter reviewed:: Yes Do you feel safe going back to the place where you live?: Yes      Need for Family Participation in Patient Care: Yes (Comment) Care giver support system in place?: Yes (comment) Current home services: DME Criminal Activity/Legal Involvement Pertinent to Current Situation/Hospitalization: No - Comment as needed  Activities of Daily Living   ADL Screening (condition at time of admission) Independently performs ADLs?: Yes (appropriate for developmental age) Is the patient deaf or have difficulty hearing?: No Does the patient have difficulty seeing, even when wearing glasses/contacts?: No Does the patient have difficulty concentrating, remembering, or making decisions?: No  Permission Sought/Granted                  Emotional Assessment Appearance:: Appears stated age Attitude/Demeanor/Rapport: Engaged, Gracious Affect (typically observed):  Accepting, Appropriate, Calm, Pleasant Orientation: : Oriented to Self, Oriented to Place, Oriented to  Time, Oriented to Situation Alcohol / Substance Use: Not Applicable Psych Involvement: No (comment)  Admission diagnosis:  Atherosclerosis of artery of extremity with rest pain [facility]) [I70.229] Patient Active Problem List   Diagnosis Date Noted   Atherosclerosis of artery of extremity with rest pain (HCC) 06/30/2023   Atherosclerosis of native arteries of extremity with rest pain (HCC) 07/17/2022   Lumbar facet joint syndrome 11/20/2021   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, right L4-L5) 05/27/2021   Type 2 diabetes mellitus with diabetic neuropathy, with long-term current use of insulin  (HCC) 03/05/2021   Chronic radicular lumbar pain 03/04/2021   Other intervertebral disc degeneration, lumbar region 03/04/2021   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/04/2021   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   Chronic pain syndrome 03/04/2021   Bilateral carotid artery stenosis 08/01/2020   Intestinal metaplasia of gastric mucosa 05/14/2020   Helicobacter pylori infection 05/14/2020   Hematochezia 11/22/2019   Hypertension 10/25/2019   Adrenal mass 1 cm to 4 cm in diameter (HCC) 10/19/2019   Nephrolithiasis 10/19/2019   Volume overload 10/19/2019   Vertigo 03/12/2014   Pre-diabetes 02/02/2014   Prostate cancer (HCC) 02/02/2014   Subcutaneous nodule 08/09/2013   Acute bronchitis 08/08/2013   Influenza B 08/08/2013   Smoking 08/08/2013   PCP:  Sherial Bail, MD Pharmacy:   [facility] [phone] - ,  [place] - [address] [place] [address] Phone: [phone] Fax: [phone]  [facility] [address], [place] - [address] [place] [address] Phone: [phone] Fax: [phone]     Social Drivers of Health (SDOH) Social History: SDOH Screenings   Food Insecurity: No Food Insecurity (06/30/2023)   Housing: Low Risk  (06/30/2023)  Transportation Needs: No Transportation Needs (06/30/2023)  Utilities: Not At Risk (06/30/2023)  Depression (PHQ2-9): Low Risk  (03/11/2022)  Financial Resource Strain: Low Risk  (04/28/2023)   Received from [facility]  Physical Activity: Sufficiently Active (12/23/2022)   Received from [facility]  Social Connections: Unknown (06/30/2023)  Stress: No Stress Concern Present (12/23/2022)   Received from [facility]  Tobacco Use: High Risk (06/30/2023)  Health Literacy: Adequate Health Literacy (12/23/2022)   Received from [facility]   SDOH Interventions:     Readmission Risk Interventions     No data to display

## 2023-07-02 NOTE — Progress Notes (Signed)
   07/02/23 1100  Spiritual Encounters  Type of Visit Initial  Care provided to: Patient  Referral source Chaplain assessment  Reason for visit Routine spiritual support  OnCall Visit No  Spiritual Framework  Presenting Themes Other (comment) (Pt on phone)

## 2023-07-02 NOTE — TOC CM/SW Note (Signed)
 Patient is not able to walk the distance required to go the bathroom, or he/she is unable to safely negotiate stairs required to access the bathroom.  A 3in1 BSC will alleviate this problem

## 2023-07-02 NOTE — Progress Notes (Signed)
 Physical Therapy Treatment Patient Details Name: Devin Mayer MRN: [id number] DOB: 11-22-1955 Today's Date: 07/02/2023   History of Present Illness Patient is a 68 year old male s/p left femoral endarterectomy with stent placement. History of COPD, HTN, PVD    PT Comments  Patient is agreeable to PT session. He is eager to walk. Increased ambulation distance and independence with hallway ambulation using rolling walker. Improved gait pattern and walking speed with increased distance. Reinforced using rolling walker for safety. Sp02 in the 90's on room air. PT will continue to follow.    If plan is discharge home, recommend the following: Help with stairs or ramp for entrance;Assist for transportation   Can travel by private vehicle        Equipment Recommendations  None recommended by PT    Recommendations for Other Services       Precautions / Restrictions Precautions Precautions: Fall Restrictions Weight Bearing Restrictions Per Provider Order: No     Mobility  Bed Mobility               General bed mobility comments: not observed as patient sitting up on arrival and post session    Transfers Overall transfer level: Needs assistance Equipment used: Rolling walker (2 wheels) Transfers: Sit to/from Stand Sit to Stand: Supervision           General transfer comment: cues for safety    Ambulation/Gait Ambulation/Gait assistance: Contact guard assist, Supervision Gait Distance (Feet): 200 Feet Assistive device: Rolling walker (2 wheels) Gait Pattern/deviations: Step-to pattern, Step-through pattern, Decreased stride length, Decreased stance time - left Gait velocity: decreased     General Gait Details: patient walked around the unit with rolling walker. reinforced use of rolling walker for safety with ambulation. gait pattern improved with increased walking distance with increase in gait speed. cues for safety, appropriate walking speed, etc.   Stairs              Wheelchair Mobility     Tilt Bed    Modified Rankin (Stroke Patients Only)       Balance Overall balance assessment: Needs assistance Sitting-balance support: Feet supported Sitting balance-Leahy Scale: Good     Standing balance support: No upper extremity supported Standing balance-Leahy Scale: Poor Standing balance comment: patient reaches for furniture when not using rolling walker. CGA for safety                            Cognition Arousal: Alert Behavior During Therapy: WFL for tasks assessed/performed Overall Cognitive Status: Within Functional Limits for tasks assessed                                          Exercises      General Comments        Pertinent Vitals/Pain Pain Assessment Pain Assessment: Faces Faces Pain Scale: Hurts little more Pain Location: L groin, L thigh Pain Descriptors / Indicators: Discomfort Pain Intervention(s): Monitored during session, Limited activity within patient's tolerance, Repositioned    Home Living                          Prior Function            PT Goals (current goals can now be found in the care plan section) Acute Rehab PT Goals Patient  Stated Goal: to go home as soon as possible PT Goal Formulation: With patient Time For Goal Achievement: 07/15/23 Potential to Achieve Goals: Good Progress towards PT goals: Progressing toward goals    Frequency    Min 1X/week      PT Plan      Co-evaluation              AM-PAC PT 6 Clicks Mobility   Outcome Measure  Help needed turning from your back to your side while in a flat bed without using bedrails?: None Help needed moving from lying on your back to sitting on the side of a flat bed without using bedrails?: A Little Help needed moving to and from a bed to a chair (including a wheelchair)?: A Little Help needed standing up from a chair using your arms (e.g., wheelchair or bedside chair)?: A  Little Help needed to walk in hospital room?: A Little Help needed climbing 3-5 steps with a railing? : A Little 6 Click Score: 19    End of Session Equipment Utilized During Treatment: Gait belt Activity Tolerance: Patient tolerated treatment well Patient left: in chair;with call bell/phone within reach Nurse Communication: Mobility status PT Visit Diagnosis: Other abnormalities of gait and mobility (R26.89);Difficulty in walking, not elsewhere classified (R26.2)     Time: 9099-9084 PT Time Calculation (min) (ACUTE ONLY): 15 min  Charges:    $Therapeutic Activity: 8-22 mins PT General Charges $$ ACUTE PT VISIT: 1 Visit                    Randine Essex, PT, MPT    Randine LULLA Essex 07/02/2023, 9:21 AM

## 2023-07-02 NOTE — Progress Notes (Signed)
 Progress Note    07/02/2023 8:47 AM 2 Days Post-Op  Subjective:  Devin Mayer is a 68 yo male now POD #1 from Left common femoral and superficial femoral artery endarterectomies and patch angioplasty.  Mechanical thrombectomy to the left external and common iliac arteries using the 8 French Rota Rex thrombectomy device. Percutaneous stent placement to the right common iliac artery with 9 mm diameter by 38 mm length. Stent placement to the left common iliac artery with 9 mm diameter by 58 mm length. Additional stent placement to the left external iliac artery with 9 mm diameter by 58 mm length.    On exam this morning patient is resting comfortably in bed.  Dressing to left groin is clean dry and intact.  Patient has strong pulses via Doppler PT/DP to left foot.  Good Capillary refill.  Patient endorses very little pain.  Vitals all remained stable.   Vitals:   07/02/23 0700 07/02/23 0800  BP: (!) 130/59 (!) 120/103  Pulse:  99  Resp: 16 19  Temp:    SpO2:  95%   Physical Exam: Cardiac:  RRR, Normal S1, S2.  No murmurs appreciated. Lungs: Lungs clear on auscultation but diminished in the bases.  No rales rhonchi or wheezing noted.   Incisions:  Left groin incision with dressing clean dry and intact. Extremities: Bilateral lower extremities warm to touch.  Right lower extremity palpable DP PT pulses.  Left lower extremity strong Doppler DP PT pulses.  Good capillary refill Abdomen: Positive bowel sounds throughout, soft, nontender nondistended. Neurologic: Alert and oriented x 3, answers all questions and follows commands appropriately.  CBC    Component Value Date/Time   WBC 9.6 07/01/2023 0127   RBC 4.35 07/01/2023 0127   HGB 14.1 07/01/2023 0127   HGB 16.0 04/13/2013 1040   HCT 39.0 07/01/2023 0127   HCT 46.6 04/13/2013 1040   PLT 185 07/01/2023 0127   PLT 234 04/13/2013 1040   MCV 89.7 07/01/2023 0127   MCV 95 04/13/2013 1040   MCH 32.4 07/01/2023 0127   MCHC 36.2 (H)  07/01/2023 0127   RDW 12.5 07/01/2023 0127   RDW 12.7 04/13/2013 1040   LYMPHSABS 2.0 06/23/2023 1156   MONOABS 0.6 06/23/2023 1156   EOSABS 0.3 06/23/2023 1156   BASOSABS 0.1 06/23/2023 1156    BMET    Component Value Date/Time   NA 135 07/01/2023 0127   NA 137 04/13/2013 1040   K 3.3 (L) 07/01/2023 0127   K 4.3 04/13/2013 1040   CL 101 07/01/2023 0127   CL 107 04/13/2013 1040   CO2 25 07/01/2023 0127   CO2 25 04/13/2013 1040   GLUCOSE 214 (H) 07/01/2023 0127   GLUCOSE 155 (H) 04/13/2013 1040   BUN 21 07/01/2023 0127   BUN 15 04/13/2013 1040   CREATININE 1.26 (H) 07/01/2023 0127   CREATININE 1.20 07/02/2021 1522   CALCIUM 8.2 (L) 07/01/2023 0127   CALCIUM 9.3 04/13/2013 1040   GFRNONAA >60 07/01/2023 0127   GFRNONAA >60 04/13/2013 1040   GFRAA >60 12/19/2016 0113   GFRAA >60 04/13/2013 1040    INR No results found for: INR   Intake/Output Summary (Last 24 hours) at 07/02/2023 0847 Last data filed at 07/02/2023 0800 Gross per 24 hour  Intake 990 ml  Output 1500 ml  Net -510 ml     Assessment/Plan:  68 y.o. male is s/p left femoral endarterectomy with multiple stent placements and mechanical thrombectomy to left external and common iliac arteries.  2 Days Post-Op   Plan Transfer to Floor Start ASA 81 mg Daily and Plavix  75 mg daily and Pravachol  40 mg daily.  Discontinue Celebrex  for now. Continue Lovenox  40 mg daily until ambulating well.  Advance diet as tolerated Pain medication Prn PT/OT  OOB with assists.    DVT prophylaxis:  Lovenox  40 mg Sq Q24   Devin Mayer R [facility] 07/02/2023 8:47 AM

## 2023-07-03 LAB — GLUCOSE, CAPILLARY
Glucose-Capillary: 129 mg/dL — ABNORMAL HIGH (ref 70–99)
Glucose-Capillary: 183 mg/dL — ABNORMAL HIGH (ref 70–99)
Glucose-Capillary: 180 mg/dL — ABNORMAL HIGH (ref 70–99)
Glucose-Capillary: 189 mg/dL — ABNORMAL HIGH (ref 70–99)

## 2023-07-03 NOTE — Plan of Care (Signed)
  Problem: Education: Goal: Knowledge of General Education information will improve Description: Including pain rating scale, medication(s)/side effects and non-pharmacologic comfort measures Outcome: Progressing   Problem: Health Behavior/Discharge Planning: Goal: Ability to manage health-related needs will improve Outcome: Progressing   Problem: Clinical Measurements: Goal: Ability to maintain clinical measurements within normal limits will improve Outcome: Progressing Goal: Will remain free from infection Outcome: Progressing Goal: Diagnostic test results will improve Outcome: Progressing Goal: Respiratory complications will improve Outcome: Progressing Goal: Cardiovascular complication will be avoided Outcome: Progressing   Problem: Activity: Goal: Risk for activity intolerance will decrease Outcome: Progressing   Problem: Nutrition: Goal: Adequate nutrition will be maintained Outcome: Progressing   Problem: Coping: Goal: Level of anxiety will decrease Outcome: Progressing   Problem: Elimination: Goal: Will not experience complications related to bowel motility Outcome: Progressing Goal: Will not experience complications related to urinary retention Outcome: Progressing   Problem: Pain Managment: Goal: General experience of comfort will improve and/or be controlled Outcome: Progressing   Problem: Safety: Goal: Ability to remain free from injury will improve Outcome: Progressing   Problem: Skin Integrity: Goal: Risk for impaired skin integrity will decrease Outcome: Progressing   Problem: Education: Goal: Knowledge of prescribed regimen will improve Outcome: Progressing   Problem: Activity: Goal: Ability to tolerate increased activity will improve Outcome: Progressing   Problem: Bowel/Gastric: Goal: Gastrointestinal status for postoperative course will improve Outcome: Progressing   Problem: Clinical Measurements: Goal: Postoperative complications  will be avoided or minimized Outcome: Progressing Goal: Signs and symptoms of graft occlusion will improve Outcome: Progressing   Problem: Skin Integrity: Goal: Demonstration of wound healing without infection will improve Outcome: Progressing   Problem: Education: Goal: Ability to describe self-care measures that may prevent or decrease complications (Diabetes Survival Skills Education) will improve Outcome: Progressing Goal: Individualized Educational Video(s) Outcome: Progressing   Problem: Coping: Goal: Ability to adjust to condition or change in health will improve Outcome: Progressing   Problem: Fluid Volume: Goal: Ability to maintain a balanced intake and output will improve Outcome: Progressing   Problem: Health Behavior/Discharge Planning: Goal: Ability to identify and utilize available resources and services will improve Outcome: Progressing Goal: Ability to manage health-related needs will improve Outcome: Progressing   Problem: Metabolic: Goal: Ability to maintain appropriate glucose levels will improve Outcome: Progressing   Problem: Nutritional: Goal: Maintenance of adequate nutrition will improve Outcome: Progressing Goal: Progress toward achieving an optimal weight will improve Outcome: Progressing   Problem: Skin Integrity: Goal: Risk for impaired skin integrity will decrease Outcome: Progressing   Problem: Tissue Perfusion: Goal: Adequacy of tissue perfusion will improve Outcome: Progressing

## 2023-07-03 NOTE — TOC Progression Note (Signed)
 Transition of Care [facility]) - Progression Note    Patient Details  Name: Devin Mayer MRN: [id number] Date of Birth: 12-10-1955  Transition of Care [facility]) CM/SW Contact  Rolin JONETTA [place], [place] Phone Number: 07/03/2023, 9:56 AM  Clinical Narrative:    CSW routed narrative for 3in1 to provider to co-sign. Spoke with Ada with [employer] and informed of order. Will be delivered to patient's room today, clinical team updated.    Expected Discharge Plan: Home/Self Care Barriers to Discharge: Continued Medical Work up  Expected Discharge Plan and Services     Post Acute Care Choice: Durable Medical Equipment Living arrangements for the past 2 months: Mobile Home                                       Social Determinants of Health (SDOH) Interventions SDOH Screenings   Food Insecurity: No Food Insecurity (06/30/2023)  Housing: Low Risk  (06/30/2023)  Transportation Needs: No Transportation Needs (06/30/2023)  Utilities: Not At Risk (06/30/2023)  Depression (PHQ2-9): Low Risk  (03/11/2022)  Financial Resource Strain: Low Risk  (04/28/2023)   Received from [facility]  Physical Activity: Sufficiently Active (12/23/2022)   Received from [facility]  Social Connections: Unknown (06/30/2023)  Stress: No Stress Concern Present (12/23/2022)   Received from [facility]  Tobacco Use: High Risk (06/30/2023)  Health Literacy: Adequate Health Literacy (12/23/2022)   Received from [facility]    Readmission Risk Interventions     No data to display

## 2023-07-03 NOTE — Progress Notes (Signed)
 Mobility Specialist - Progress Note   07/03/23 0948  Mobility  Activity Ambulated with assistance in hallway;Stood at bedside;Dangled on edge of bed  Level of Assistance Standby assist, set-up cues, supervision of patient - no hands on  Assistive Device Front wheel walker  Distance Ambulated (ft) 160 ft  Activity Response Tolerated well  Mobility Referral Yes  Mobility visit 1 Mobility  Mobility Specialist Start Time (ACUTE ONLY) [id number]  Mobility Specialist Stop Time (ACUTE ONLY) [id number]  Mobility Specialist Time Calculation (min) (ACUTE ONLY) 15 min   Pt supine in bed on RA upon arrival. Pt completes bed mobility, STS, and ambulation Supervision with no hands on. Pt does endorse pain in left knee and request pain medication, RN notified. Pt left in bed with needs in reach.   Otho Louder  Mobility Specialist  07/03/23 9:50 AM

## 2023-07-03 NOTE — Progress Notes (Signed)
 3 Days Post-Op   Subjective/Chief Complaint: Mild pain in left groin and thigh. Pain controlled with current regimen.Worked with PT yesterday.    Objective: Vital signs in last 24 hours: Temp:  [97.6 F (36.4 C)-98.1 F (36.7 C)] 98 F (36.7 C) (02/08 0740) Pulse Rate:  [82-100] 82 (02/08 0358) Resp:  [14-27] 16 (02/08 0740) BP: (112-130)/(54-85) 125/73 (02/08 0740) SpO2:  [92 %-98 %] 97 % (02/08 0358) Last BM Date : 06/30/23  Intake/Output from previous day: 02/07 0701 - 02/08 0700 In: 480 [P.O.:480] Out: 1025 [Urine:1025] Intake/Output this shift: No intake/output data recorded.  General appearance: alert and no distress Cardio: regular rate and rhythm Extremities: LEFT groin dressing- C/D/I, soft, leg warm, Palpable DP, + doppler PT; Right lower extremity- warm, palp DP/PT  Lab Results:  Recent Labs    06/30/23 1659 07/01/23 0127  WBC 9.2 9.6  HGB 16.0 14.1  HCT 47.1 39.0  PLT 177 185   BMET Recent Labs    06/30/23 1659 07/01/23 0127  NA  --  135  K  --  3.3*  CL  --  101  CO2  --  25  GLUCOSE  --  214*  BUN  --  21  CREATININE 1.21 1.26*  CALCIUM  --  8.2*   PT/INR No results for input(s): LABPROT, INR in the last 72 hours. ABG No results for input(s): PHART, HCO3 in the last 72 hours.  Invalid input(s): PCO2, PO2  Studies/Results: No results found.  Anti-infectives: Anti-infectives (From admission, onward)    Start     Dose/Rate Route Frequency Ordered Stop   06/30/23 1930  ceFAZolin  (ANCEF ) IVPB 2g/100 mL premix        2 g 200 mL/hr over 30 Minutes Intravenous Every 8 hours 06/30/23 1639 07/01/23 0403   06/30/23 1000  ceFAZolin  (ANCEF ) IVPB 2g/100 mL premix        2 g 200 mL/hr over 30 Minutes Intravenous On call to O.R. 06/30/23 0955 06/30/23 1144       Assessment/Plan: s/p Procedure(s): ENDARTERECTOMY FEMORAL (Left) INSERTION OF ILIAC STENT (Bilateral) APPLICATION OF CELL SAVER (Left) POD #3  OOB with PT Pain  control ASA/Plavix /Pravachol  Dispo likely Monday with Home health  LOS: 3 days    Tisa Dakin A 07/03/2023

## 2023-07-03 NOTE — TOC CM/SW Note (Signed)
 Patient is not able to walk the distance required to go the bathroom, or he/she is unable to safely negotiate stairs required to access the bathroom.  A 3in1 BSC will alleviate this problem

## 2023-07-04 LAB — GLUCOSE, CAPILLARY
Glucose-Capillary: 170 mg/dL — ABNORMAL HIGH (ref 70–99)
Glucose-Capillary: 160 mg/dL — ABNORMAL HIGH (ref 70–99)
Glucose-Capillary: 169 mg/dL — ABNORMAL HIGH (ref 70–99)
Glucose-Capillary: 148 mg/dL — ABNORMAL HIGH (ref 70–99)

## 2023-07-04 NOTE — Progress Notes (Signed)
 4 Days Post-Op   Subjective/Chief Complaint: Still has pain at LEFT groin surgical site. Moderately Controlled with current regimen. Walked some with PT yesterday. Otherwise was in the bed the entire day.   Objective: Vital signs in last 24 hours: Temp:  [97.8 F (36.6 C)-98 F (36.7 C)] 98 F (36.7 C) (02/09 0530) Pulse Rate:  [89-96] 90 (02/09 0530) Resp:  [18-20] 20 (02/09 0530) BP: (121-144)/(67-97) 121/72 (02/09 0530) SpO2:  [95 %-98 %] 95 % (02/09 0530) Last BM Date : 06/30/23  Intake/Output from previous day: No intake/output data recorded. Intake/Output this shift: No intake/output data recorded.  General appearance: alert and no distress Cardio: regular rate and rhythm Extremities: LEFT groin dressing- C/D/I, soft, leg warm, Palpable DP, + doppler PT; Right lower extremity- warm, palp DP/PT   Lab Results:  No results for input(s): WBC, HGB, HCT, PLT in the last 72 hours. BMET No results for input(s): NA, K, CL, CO2, GLUCOSE, BUN, CREATININE, CALCIUM in the last 72 hours. PT/INR No results for input(s): LABPROT, INR in the last 72 hours. ABG No results for input(s): PHART, HCO3 in the last 72 hours.  Invalid input(s): PCO2, PO2  Studies/Results: No results found.  Anti-infectives: Anti-infectives (From admission, onward)    Start     Dose/Rate Route Frequency Ordered Stop   06/30/23 1930  ceFAZolin  (ANCEF ) IVPB 2g/100 mL premix        2 g 200 mL/hr over 30 Minutes Intravenous Every 8 hours 06/30/23 1639 07/01/23 0403   06/30/23 1000  ceFAZolin  (ANCEF ) IVPB 2g/100 mL premix        2 g 200 mL/hr over 30 Minutes Intravenous On call to O.R. 06/30/23 0955 06/30/23 1144       Assessment/Plan: s/p Procedure(s): ENDARTERECTOMY FEMORAL (Left) INSERTION OF ILIAC STENT (Bilateral) APPLICATION OF CELL SAVER (Left) POD #4  OOB to chair the majority of the day Ambulate with assist  Pain control Replete K, was 3.3 Home  tomorrow with Home Health  LOS: 4 days    Tisa Dakin A 07/04/2023

## 2023-07-04 NOTE — Progress Notes (Signed)
 Mobility Specialist - Progress Note   07/04/23 0918  Mobility  Activity Ambulated independently in hallway  Level of Assistance Independent  Assistive Device Front wheel walker  Distance Ambulated (ft) 180 ft  Activity Response Tolerated well  Mobility Referral Yes  Mobility visit 1 Mobility  Mobility Specialist Start Time (ACUTE ONLY) [id number]  Mobility Specialist Stop Time (ACUTE ONLY) [id number]  Mobility Specialist Time Calculation (min) (ACUTE ONLY) 11 min   Pt side lying asleep on RA. Pt awakens to gentle touch and agreeable to mobility. Pt STS and ambulates in hallway indep with no LOB noted. Pt left in recliner with needs in reach.   Otho Louder  Mobility Specialist  07/04/23 9:22 AM

## 2023-07-05 LAB — GLUCOSE, CAPILLARY
Glucose-Capillary: 133 mg/dL — ABNORMAL HIGH (ref 70–99)
Glucose-Capillary: 135 mg/dL — ABNORMAL HIGH (ref 70–99)

## 2023-07-05 NOTE — Discharge Summary (Signed)
[facility]    Discharge Summary    Patient ID:  Devin Mayer MRN: [id number] DOB/AGE: Aug 20, 1955 68 y.o.  Admit date: 06/30/2023 Discharge date: 07/05/2023 Date of Surgery: 06/30/2023 Surgeon: Surgeon(s): Dew, Marlow Baars, MD Schnier, Latina Craver, MD  Admission Diagnosis: Atherosclerosis of artery of extremity with rest pain [facility]) [I70.229]  Discharge Diagnoses:  Atherosclerosis of artery of extremity with rest pain [facility]) [I70.229]  Secondary Diagnoses: Past Medical History:  Diagnosis Date   Acute bronchitis 2015   Adrenal mass (HCC)    Atherosclerosis of native arteries of extremities with gangrene, bilateral legs (HCC)    Bilateral carotid artery stenosis    Cancer (HCC)    Chronic pain syndrome    Chronic radicular lumbar pain 2022   COPD (chronic obstructive pulmonary disease) (HCC)    Coronary artery disease    Degeneration of thoracic intervertebral disc    Depression    Diabetes mellitus without complication (HCC)    Dyspnea    GERD (gastroesophageal reflux disease)    Helicobacter pylori infection    Hematochezia 2021   Hyperlipidemia    Hypertension    Influenza B 2015   Intercostal neuralgia    Intestinal metaplasia of gastric mucosa    Lumbar facet joint syndrome    Nephrolithiasis    Neuroforaminal stenosis of lumbar spine    Other intervertebral disc degeneration, lumbar region with discogenic back pain only    Pre-diabetes    Prostate cancer (HCC) 2015   Smoking    Stroke (HCC)    Subcutaneous nodule    Type 2 diabetes mellitus with diabetic nephropathy, with long-term current use of insulin (HCC)    Vertigo    Volume overload 2021    Procedure(s): ENDARTERECTOMY FEMORAL INSERTION OF ILIAC STENT APPLICATION OF CELL SAVER  Discharged Condition: good  HPI:  Devin Mayer is a 68 year old male now status postop day 5 from left femoral endarterectomy.  Patient is resting comfortably in bed this afternoon.  Dressing is  clean dry and intact.  Patient's left lower extremity is warm to touch with palpable pulses.  Good capillary refill.  Patient is eating well ambulating and urinating on his own.  A 3 and 1 bedside commode has been ordered for him at home.  Discharge instructions were explained at the bedside this afternoon to both he and his wife.  Both verbalized her understanding.  Patient to be discharged today to home.  Patient will be discharged on aspirin 81 mg daily, Plavix 75 mg daily, and Pravachol 40 mg daily.  Patient was instructed to not miss or skip any of these medication doses daily as it will interfere with the outcome of his surgery.  Both he and his wife verbalized her understanding.  Hospital Course:  Devin Mayer is a 68 y.o. male is S/P Left femoral Endarterectomy Extubated: POD # 0 Physical Exam:  Alert notes x3, no acute distress Face: Symmetrical.  Tongue is midline. Neck: Trachea is midline.  No swelling or bruising. Cardiovascular: Regular rate and rhythm Pulmonary: Clear to auscultation bilaterally Abdomen: Soft, nontender, non-distended Left Lower Extremity with staples clean dry and intact.  Left lower extremity: Thigh soft.  Calf soft.  Extremities warm distally toes.  Hard to palpate pedal pulses however the foot is warm is her good capillary refill. Right lower extremity: Thigh soft.  Calf soft.  Extremities warm distally toes.  Hard to palpate pedal pulses however the foot is warm is her good capillary refill. Neurological: No  deficits noted   Post-op wounds:  clean, dry, intact or healing well  Pt. Ambulating, voiding and taking PO diet without difficulty. Pt pain controlled with PO pain meds.  Labs:  As below  Complications: none  Consults:    Significant Diagnostic Studies: CBC Lab Results  Component Value Date   WBC 9.6 07/01/2023   HGB 14.1 07/01/2023   HCT 39.0 07/01/2023   MCV 89.7 07/01/2023   PLT 185 07/01/2023    BMET    Component Value  Date/Time   NA 135 07/01/2023 0127   NA 137 04/13/2013 1040   K 3.3 (L) 07/01/2023 0127   K 4.3 04/13/2013 1040   CL 101 07/01/2023 0127   CL 107 04/13/2013 1040   CO2 25 07/01/2023 0127   CO2 25 04/13/2013 1040   GLUCOSE 214 (H) 07/01/2023 0127   GLUCOSE 155 (H) 04/13/2013 1040   BUN 21 07/01/2023 0127   BUN 15 04/13/2013 1040   CREATININE 1.26 (H) 07/01/2023 0127   CREATININE 1.20 07/02/2021 1522   CALCIUM 8.2 (L) 07/01/2023 0127   CALCIUM 9.3 04/13/2013 1040   GFRNONAA >60 07/01/2023 0127   GFRNONAA >60 04/13/2013 1040   GFRAA >60 12/19/2016 0113   GFRAA >60 04/13/2013 1040   COAG No results found for: "INR", "PROTIME"   Disposition:  Discharge to :Home  Allergies as of 07/05/2023       Reactions   Latex Rash        Medication List     STOP taking these medications    rosuvastatin 40 MG tablet Commonly known as: CRESTOR       TAKE these medications    Accu-Chek Guide test strip Generic drug: glucose blood 1 each (1 strip total) 2 (two) times daily Use as instructed.   albuterol 108 (90 Base) MCG/ACT inhaler Commonly known as: VENTOLIN HFA Inhale 2 puffs into the lungs every 6 (six) hours as needed.   aspirin EC 81 MG tablet Take 1 tablet (81 mg total) by mouth daily. Swallow whole.   BD Pen Needle Nano 2nd Gen 32G X 4 MM Misc Generic drug: Insulin Pen Needle See admin instructions.   celecoxib 200 MG capsule Commonly known as: CELEBREX Take 200 mg by mouth daily.   clonazePAM 0.5 MG tablet Commonly known as: KLONOPIN Take 1 tablet (0.5 mg total) by mouth at bedtime as needed. What changed: reasons to take this   clopidogrel 75 MG tablet Commonly known as: Plavix Take 1 tablet (75 mg total) by mouth daily.   cyclobenzaprine 10 MG tablet Commonly known as: FLEXERIL Take 10 mg by mouth 2 (two) times daily as needed.   Dexcom G7 Receiver Devi USE 1 EACH AS DIRECTED TO MONITOR BLOOD SUGARS DAILY   Dexcom G7 Sensor Misc USE 1 EACH  EVERY 10 (TEN) DAYS   famotidine 20 MG tablet Commonly known as: PEPCID Take 20 mg by mouth 2 (two) times daily.   fenofibrate 145 MG tablet Commonly known as: TRICOR Take 145 mg by mouth daily.   glipiZIDE 2.5 MG 24 hr tablet Commonly known as: GLUCOTROL XL Take 2.5 mg by mouth daily.   hydrochlorothiazide 25 MG tablet Commonly known as: HYDRODIURIL Take 25 mg by mouth daily.   HYDROcodone-acetaminophen 5-325 MG tablet Commonly known as: NORCO/VICODIN Take 1 tablet by mouth every 6 (six) hours as needed.   Januvia 100 MG tablet Generic drug: sitaGLIPtin Take 100 mg by mouth daily.   Lantus SoloStar 100 UNIT/ML Solostar Pen Generic drug:  insulin glargine Inject 12 Units into the skin daily.   metFORMIN 500 MG 24 hr tablet Commonly known as: GLUCOPHAGE-XR TAKE 1 TABLET BY MOUTH DAILY WITH SUPPER. What changed: when to take this   nortriptyline 10 MG capsule Commonly known as: PAMELOR Take 20 mg by mouth at bedtime.   pantoprazole 40 MG tablet Commonly known as: PROTONIX Take 1 tablet (40 mg total) by mouth daily before breakfast. What changed: when to take this   potassium chloride SA 20 MEQ tablet Commonly known as: KLOR-CON M Take 1 tablet (20 mEq total) by mouth daily.   pravastatin 40 MG tablet Commonly known as: PRAVACHOL Take 1 tablet (40 mg total) by mouth daily.   pregabalin 75 MG capsule Commonly known as: LYRICA Take 75 mg by mouth 2 (two) times daily.       Verbal and written Discharge instructions given to the patient. Wound care per Discharge AVS  Follow-up Information     Georgiana Spinner, NP Follow up in 3 week(s).   Specialty: Vascular Surgery Why: Staple Removal Contact information: [address] [place] [address] [phone]                 Signed: Marcie Bal, NP  07/05/2023, 2:26 PM

## 2023-07-05 NOTE — Care Management Important Message (Signed)
 Important Message  Patient Details  Name: Devin Mayer MRN: [id number] Date of Birth: 12/07/1955   Important Message Given:  Yes - Medicare IM     Felix Host 07/05/2023, 3:17 PM

## 2023-07-05 NOTE — Discharge Instructions (Signed)
 Do not lift anything heavy for the next 3 weeks.  Do not lift anything more than a gallon of milk.  Do not drive until you return and have your staples removed.  You may shower tomorrow on 07/06/2023.  While showering you may remove the dressing off of your left groin and leave open to air.  Please make sure the staple line is dry at all times.  You may shower without a dressing in place just let the water and soap run over the incision line.  Do not scrub the staples.  Pat completely dry when out of the shower.  You will need to take aspirin  81 mg once daily, Plavix  75 mg once daily, and Pravachol  your cholesterol medication 40 mg once daily.  Please do not skip taking any of these medications as it will alter the outcome of your current surgery.  Follow-up with vein and vascular surgery for your staple removal as scheduled.

## 2023-07-05 NOTE — Progress Notes (Signed)
 Occupational Therapy Treatment Patient Details Name: Devin Mayer MRN: [id number] DOB: 1955-12-03 Today's Date: 07/05/2023   History of present illness Patient is a 68 year old male s/p left femoral endarterectomy with stent placement. History of COPD, HTN, PVD   OT comments  Pt seen for OT tx this morning. Pt pleasant, agreeable, and endorses improvement in pain from earlier pain medication. Pt ambulated around nurses unit with SUPV while OT facilitated problem solving for ADL/IADL routines, instructed in home/routines modifications, falls prevention, RW vs 4WW, and AE/DME for LB ADL tasks to improve comfort during recovery. Pt verbalized understanding. Progressing towards goals, continues to benefit from skilled OT services while hospitalized.       If plan is discharge home, recommend the following:  A little help with walking and/or transfers;A little help with bathing/dressing/bathroom;Help with stairs or ramp for entrance   Equipment Recommendations  BSC/3in1;Other (comment) (2WW)    Recommendations for Other Services      Precautions / Restrictions Precautions Precautions: Fall Restrictions Weight Bearing Restrictions Per Provider Order: No       Mobility Bed Mobility Overal bed mobility: Modified Independent                  Transfers Overall transfer level: Needs assistance Equipment used: Rolling walker (2 wheels) Transfers: Sit to/from Stand Sit to Stand: Supervision           General transfer comment: cues for RW     Balance Overall balance assessment: Needs assistance Sitting-balance support: Feet supported Sitting balance-Leahy Scale: Good     Standing balance support: Reliant on assistive device for balance, During functional activity Standing balance-Leahy Scale: Fair                             ADL either performed or assessed with clinical judgement   ADL Overall ADL's : Needs assistance/impaired                      Lower Body Dressing: Sitting/lateral leans;Modified independent Lower Body Dressing Details (indicate cue type and reason): increased effort/time and discomfort to reach and adjust socks from seated             Functional mobility during ADLs: Supervision/safety;Rolling walker (2 wheels)      Extremity/Trunk Assessment              Vision       Perception     Praxis      Cognition Arousal: Alert Behavior During Therapy: WFL for tasks assessed/performed Overall Cognitive Status: Within Functional Limits for tasks assessed                                          Exercises Other Exercises Other Exercises: Pt ambulated around nurses unit with SUPV while OT facilitated problem solving for ADL/IADL routines, instructed in home/routines modifications, falls prevention, RW vs 4WW, and AE/DME for LB ADL tasks to improve comfort during recovery.    Shoulder Instructions       General Comments      Pertinent Vitals/ Pain       Pain Assessment Pain Assessment: Faces Faces Pain Scale: Hurts a little bit Pain Location: L groin, L thigh Pain Descriptors / Indicators: Discomfort, Aching Pain Intervention(s): Limited activity within patient's tolerance, Monitored during session, Premedicated before session, Repositioned  Home Living                                          Prior Functioning/Environment              Frequency  Min 1X/week        Progress Toward Goals  OT Goals(current goals can now be found in the care plan section)  Progress towards OT goals: Progressing toward goals  Acute Rehab OT Goals Patient Stated Goal: go home OT Goal Formulation: With patient Time For Goal Achievement: 07/15/23 Potential to Achieve Goals: Good  Plan      Co-evaluation                 AM-PAC OT "6 Clicks" Daily Activity     Outcome Measure   Help from another person eating meals?: None Help from another person  taking care of personal grooming?: None Help from another person toileting, which includes using toliet, bedpan, or urinal?: A Little Help from another person bathing (including washing, rinsing, drying)?: A Little Help from another person to put on and taking off regular upper body clothing?: None Help from another person to put on and taking off regular lower body clothing?: None 6 Click Score: 22    End of Session Equipment Utilized During Treatment: Rolling walker (2 wheels)  OT Visit Diagnosis: Unsteadiness on feet (R26.81)   Activity Tolerance Patient tolerated treatment well   Patient Left in bed;with call bell/phone within reach   Nurse Communication          Time: 0911-0920 OT Time Calculation (min): 9 min  Charges: OT General Charges $OT Visit: 1 Visit OT Treatments $Self Care/Home Management : 8-22 mins  Berenda Breaker., MPH, MS, OTR/L ascom [phone] 07/05/23, 9:30 AM

## 2023-07-05 NOTE — Progress Notes (Signed)
 Physical Therapy Treatment Patient Details Name: Devin Mayer MRN: [id number] DOB: February 19, 1956 Today's Date: 07/05/2023   History of Present Illness Patient is a 68 year old male s/p left femoral endarterectomy with stent placement. History of COPD, HTN, PVD    PT Comments  Pt received in bed, reports L groin is feeling better and he hopes to return home today. Pt does not require any physical assist for bed mobility or transfers. Currently requires a RW for balance during gait and to off-load L LE from groin discomfort. CM notified of RW need at d/c. Pt and wife educated on proper stair negotiation and safe mobility post d/c to home. Continue PT per POC.   If plan is discharge home, recommend the following: Help with stairs or ramp for entrance;Assist for transportation   Can travel by private vehicle        Equipment Recommendations  Rolling walker (2 wheels)    Recommendations for Other Services       Precautions / Restrictions Precautions Precautions: Fall Restrictions Weight Bearing Restrictions Per Provider Order: No     Mobility  Bed Mobility Overal bed mobility: Modified Independent Bed Mobility: Supine to Sit, Sit to Supine     Supine to sit: Modified independent (Device/Increase time) Sit to supine: Modified independent (Device/Increase time)        Transfers Overall transfer level: Needs assistance Equipment used: Rolling walker (2 wheels) Transfers: Sit to/from Stand Sit to Stand: Supervision           General transfer comment: Cues for proper positioning inside RW    Ambulation/Gait Ambulation/Gait assistance: Contact guard assist, Supervision Gait Distance (Feet): 180 Feet Assistive device: Rolling walker (2 wheels) Gait Pattern/deviations: Step-to pattern, Step-through pattern, Decreased stride length, Decreased stance time - left Gait velocity: decreased     General Gait Details:  (Gait tolerance continues to improve, increased steadiness  with use of RW)   Stairs Stairs:  (Pt and wife educated on proper stair negotiation to reduce discomfot with good understanding)           Wheelchair Mobility     Tilt Bed    Modified Rankin (Stroke Patients Only)       Balance Overall balance assessment: Needs assistance Sitting-balance support: Feet supported Sitting balance-Leahy Scale: Good     Standing balance support: Reliant on assistive device for balance, During functional activity, Bilateral upper extremity supported Standing balance-Leahy Scale: Fair Standing balance comment: patient reaches for furniture when not using rolling walker. CGA for safety                            Cognition Arousal: Alert Behavior During Therapy: WFL for tasks assessed/performed Overall Cognitive Status: Within Functional Limits for tasks assessed                                          Exercises General Exercises - Lower Extremity Ankle Circles/Pumps: AROM, Both, 10 reps Long Arc Quad: AROM, Both, 10 reps, Seated    General Comments        Pertinent Vitals/Pain Pain Assessment Pain Assessment: Faces Faces Pain Scale: Hurts a little bit Pain Location: L groin, L thigh Pain Descriptors / Indicators: Discomfort, Aching Pain Intervention(s): Monitored during session    Home Living  Prior Function            PT Goals (current goals can now be found in the care plan section) Acute Rehab PT Goals Patient Stated Goal: to go home as soon as possible Progress towards PT goals: Progressing toward goals    Frequency    Min 1X/week      PT Plan      Co-evaluation              AM-PAC PT "6 Clicks" Mobility   Outcome Measure  Help needed turning from your back to your side while in a flat bed without using bedrails?: None Help needed moving from lying on your back to sitting on the side of a flat bed without using bedrails?: A Little Help  needed moving to and from a bed to a chair (including a wheelchair)?: A Little Help needed standing up from a chair using your arms (e.g., wheelchair or bedside chair)?: A Little Help needed to walk in hospital room?: A Little Help needed climbing 3-5 steps with a railing? : A Little 6 Click Score: 19    End of Session Equipment Utilized During Treatment: Gait belt Activity Tolerance: Patient tolerated treatment well Patient left: in chair;with call bell/phone within reach Nurse Communication: Mobility status PT Visit Diagnosis: Other abnormalities of gait and mobility (R26.89);Difficulty in walking, not elsewhere classified (R26.2)     Time: 1914-7829 PT Time Calculation (min) (ACUTE ONLY): 24 min  Charges:    $Gait Training: 8-22 mins $Therapeutic Exercise: 8-22 mins PT General Charges $$ ACUTE PT VISIT: 1 Visit                    Melvyn Stagers, PTA  [name] 07/05/2023, 1:17 PM

## 2023-07-06 ENCOUNTER — Telehealth (INDEPENDENT_AMBULATORY_CARE_PROVIDER_SITE_OTHER): Payer: Self-pay

## 2023-07-06 ENCOUNTER — Other Ambulatory Visit (INDEPENDENT_AMBULATORY_CARE_PROVIDER_SITE_OTHER): Payer: Self-pay | Admitting: Nurse Practitioner

## 2023-07-06 MED ORDER — OXYCODONE HCL 5 MG PO TABS: 5.0000 mg | ORAL_TABLET | Freq: Four times a day (QID) | ORAL | 0 refills | Status: DC | PRN

## 2023-07-06 MED ORDER — OXYCODONE HCL 5 MG PO TABS
5.0000 mg | ORAL_TABLET | Freq: Four times a day (QID) | ORAL | 0 refills | Status: AC | PRN
Start: 2023-07-06 — End: ?

## 2023-07-06 NOTE — Telephone Encounter (Signed)
sent

## 2023-07-06 NOTE — Telephone Encounter (Signed)
Patient significant other left a message stating that [facility] [address] was out of stock for Oxycodone and prescription will need to sent [facility] [place]. Previous prescription has been cancel and prescription will be [facility] in [place]

## 2023-07-06 NOTE — Telephone Encounter (Signed)
Patient significant other left a message requesting for pain medication for pain relief from femoral endarterectomy done 06/30/23. Patient is having pain from hip down to the knee. Please Advise

## 2023-07-06 NOTE — Telephone Encounter (Signed)
Oxycodone sent in

## 2023-07-06 NOTE — Telephone Encounter (Signed)
Patient significant other was notified that refill has been sent

## 2023-07-09 ENCOUNTER — Telehealth (INDEPENDENT_AMBULATORY_CARE_PROVIDER_SITE_OTHER): Payer: Self-pay

## 2023-07-09 NOTE — Telephone Encounter (Signed)
Spoke with the patient's significant other Lorrin Jackson, she had called to say the patients left leg is swelling after his surgery on 06/30/23. I asked if he was elevating his leg and she stated he was sitting in a recliner. She was advised to elevate his leg above heart level. I did speak with Dr. Wyn Quaker regarding the swelling, per Dr. Wyn Quaker elevate the leg and wearing compression hose will help as well. The significant other was given the recommendations from Dr. Wyn Quaker and stated she understood and could do that.

## 2023-07-12 ENCOUNTER — Telehealth (INDEPENDENT_AMBULATORY_CARE_PROVIDER_SITE_OTHER): Payer: Self-pay

## 2023-07-12 NOTE — Telephone Encounter (Signed)
Symptoms has been going on for past 2 days

## 2023-07-12 NOTE — Telephone Encounter (Signed)
How long has this pain been going on? Has it been since surgery or just started? Some of this could be related to the swelling/reperfusion. If it is a new development we can try to get him in earlier with a RLE art duplex

## 2023-07-12 NOTE — Telephone Encounter (Signed)
Patient significant other Britta Mccreedy) left a message stating that the patient is having pain with the left leg from the groin down to the foot. The patient pain level is between a 8 or 9. Patient stated that it is painful to walk on the left leg. Patient had femoral endarterectomy on 06/30/23. Please Advise

## 2023-07-13 NOTE — Telephone Encounter (Signed)
You can double book him with me or dew

## 2023-07-16 ENCOUNTER — Other Ambulatory Visit (INDEPENDENT_AMBULATORY_CARE_PROVIDER_SITE_OTHER): Payer: Self-pay | Admitting: Nurse Practitioner

## 2023-07-16 DIAGNOSIS — I739 Peripheral vascular disease, unspecified: Secondary | ICD-10-CM

## 2023-07-16 DIAGNOSIS — M79604 Pain in right leg: Secondary | ICD-10-CM

## 2023-07-20 ENCOUNTER — Encounter (INDEPENDENT_AMBULATORY_CARE_PROVIDER_SITE_OTHER): Payer: Self-pay | Admitting: Vascular Surgery

## 2023-07-20 ENCOUNTER — Other Ambulatory Visit (INDEPENDENT_AMBULATORY_CARE_PROVIDER_SITE_OTHER): Payer: Self-pay | Admitting: Vascular Surgery

## 2023-07-20 ENCOUNTER — Ambulatory Visit (INDEPENDENT_AMBULATORY_CARE_PROVIDER_SITE_OTHER): Payer: 59

## 2023-07-20 ENCOUNTER — Ambulatory Visit (INDEPENDENT_AMBULATORY_CARE_PROVIDER_SITE_OTHER): Payer: 59 | Admitting: Vascular Surgery

## 2023-07-20 VITALS — BP 124/70 | HR 83 | Resp 16 | Wt 174.4 lb

## 2023-07-20 DIAGNOSIS — Z9889 Other specified postprocedural states: Secondary | ICD-10-CM | POA: Diagnosis not present

## 2023-07-20 DIAGNOSIS — Z794 Long term (current) use of insulin: Secondary | ICD-10-CM

## 2023-07-20 DIAGNOSIS — I70229 Atherosclerosis of native arteries of extremities with rest pain, unspecified extremity: Secondary | ICD-10-CM

## 2023-07-20 DIAGNOSIS — M79604 Pain in right leg: Secondary | ICD-10-CM | POA: Diagnosis not present

## 2023-07-20 DIAGNOSIS — I10 Essential (primary) hypertension: Secondary | ICD-10-CM

## 2023-07-20 DIAGNOSIS — I739 Peripheral vascular disease, unspecified: Secondary | ICD-10-CM | POA: Diagnosis not present

## 2023-07-20 MED ORDER — PREGABALIN 75 MG PO CAPS: 75.0000 mg | ORAL_CAPSULE | Freq: Two times a day (BID) | ORAL | 2 refills | Status: DC

## 2023-07-20 NOTE — Assessment & Plan Note (Signed)
 blood pressure control important in reducing the progression of atherosclerotic disease. On appropriate oral medications.

## 2023-07-20 NOTE — Progress Notes (Signed)
 Patient ID: Devin Mayer, male   DOB: [birth year], 68 y.o.   MRN: [id number]  Chief Complaint  Patient presents with   Follow-up    Rle arterial duplex follow up    HPI Devin Mayer is a 68 y.o. male.  Patient returns in follow about 3 weeks after left femoral endarterectomy as well as bilateral iliac and left SFA intervention.  He continues to have reperfusion swelling which is stable.  His wound is healed.  He still complains of significant neuropathic pain in the left lower extremity but he was profoundly ischemic for many months before revascularization so that is not necessarily surprising.  Noninvasive studies today showed normal triphasic and biphasic waveforms throughout the left lower extremity without significant flow limitation all the way down into the tibial vessels.   Past Medical History:  Diagnosis Date   Acute bronchitis 2015   Adrenal mass (HCC)    Atherosclerosis of native arteries of extremities with gangrene, bilateral legs (HCC)    Bilateral carotid artery stenosis    Cancer (HCC)    Chronic pain syndrome    Chronic radicular lumbar pain 2022   COPD (chronic obstructive pulmonary disease) (HCC)    Coronary artery disease    Degeneration of thoracic intervertebral disc    Depression    Diabetes mellitus without complication (HCC)    Dyspnea    GERD (gastroesophageal reflux disease)    Helicobacter pylori infection    Hematochezia 2021   Hyperlipidemia    Hypertension    Influenza B 2015   Intercostal neuralgia    Intestinal metaplasia of gastric mucosa    Lumbar facet joint syndrome    Nephrolithiasis    Neuroforaminal stenosis of lumbar spine    Other intervertebral disc degeneration, lumbar region with discogenic back pain only    Pre-diabetes    Prostate cancer (HCC) 2015   Smoking    Stroke (HCC)    Subcutaneous nodule    Type 2 diabetes mellitus with diabetic nephropathy, with long-term current use of insulin (HCC)    Vertigo    Volume  overload 2021    Past Surgical History:  Procedure Laterality Date   CHOLECYSTECTOMY     ENDARTERECTOMY FEMORAL Left 06/30/2023   Procedure: ENDARTERECTOMY FEMORAL;  Surgeon: Annice Needy, MD;  Location: [facility];  Service: Vascular;  Laterality: Left;   INSERTION OF ILIAC STENT Bilateral 06/30/2023   Procedure: INSERTION OF ILIAC STENT;  Surgeon: Annice Needy, MD;  Location: [facility];  Service: Vascular;  Laterality: Bilateral;   INSERTION PROSTATE RADIATION SEED     LOWER EXTREMITY ANGIOGRAPHY Left 08/03/2022   Procedure: Lower Extremity Angiography;  Surgeon: Annice Needy, MD;  Location: [facility];  Service: Cardiovascular;  Laterality: Left;   LOWER EXTREMITY ANGIOGRAPHY Left 06/14/2023   Procedure: Lower Extremity Angiography;  Surgeon: Annice Needy, MD;  Location: [facility];  Service: Cardiovascular;  Laterality: Left;   PROSTATE SURGERY     SHOULDER SURGERY Bilateral       Allergies  Allergen Reactions   Latex Rash    Current Outpatient Medications  Medication Sig Dispense Refill   ACCU-CHEK GUIDE test strip 1 each (1 strip total) 2 (two) times daily Use as instructed.     albuterol (VENTOLIN HFA) 108 (90 Base) MCG/ACT inhaler Inhale 2 puffs into the lungs every 6 (six) hours as needed.     aspirin EC 81 MG tablet Take 1 tablet (81 mg total) by mouth  daily. Swallow whole. 150 tablet 2   BD PEN NEEDLE NANO 2ND GEN 32G X 4 MM MISC See admin instructions.     celecoxib (CELEBREX) 200 MG capsule Take 200 mg by mouth daily.     clonazePAM (KLONOPIN) 0.5 MG tablet Take 1 tablet (0.5 mg total) by mouth at bedtime as needed. (Patient taking differently: Take 0.5 mg by mouth at bedtime as needed for anxiety.) 10 tablet 5   clopidogrel (PLAVIX) 75 MG tablet Take 1 tablet (75 mg total) by mouth daily. 30 tablet 11   Continuous Glucose Receiver (DEXCOM G7 RECEIVER) DEVI USE 1 EACH AS DIRECTED TO MONITOR BLOOD SUGARS DAILY     Continuous Glucose Sensor (DEXCOM G7  SENSOR) MISC USE 1 EACH EVERY 10 (TEN) DAYS     cyclobenzaprine (FLEXERIL) 10 MG tablet Take 10 mg by mouth 2 (two) times daily as needed.     famotidine (PEPCID) 20 MG tablet Take 20 mg by mouth 2 (two) times daily.     fenofibrate (TRICOR) 145 MG tablet Take 145 mg by mouth daily.     glipiZIDE (GLUCOTROL XL) 2.5 MG 24 hr tablet Take 2.5 mg by mouth daily.     hydrochlorothiazide (HYDRODIURIL) 25 MG tablet Take 25 mg by mouth daily.     insulin glargine (LANTUS SOLOSTAR) 100 UNIT/ML Solostar Pen Inject 12 Units into the skin daily. 15 mL 5   JANUVIA 100 MG tablet Take 100 mg by mouth daily.     metFORMIN (GLUCOPHAGE-XR) 500 MG 24 hr tablet TAKE 1 TABLET BY MOUTH DAILY WITH SUPPER. (Patient taking differently: Take 500 mg by mouth daily with breakfast.) 90 tablet 0   nortriptyline (PAMELOR) 10 MG capsule Take 20 mg by mouth at bedtime.     oxyCODONE (OXY IR/ROXICODONE) 5 MG immediate release tablet Take 1-2 tablets (5-10 mg total) by mouth every 6 (six) hours as needed for severe pain (pain score 7-10). 30 tablet 0   pantoprazole (PROTONIX) 40 MG tablet Take 1 tablet (40 mg total) by mouth daily before breakfast. (Patient taking differently: Take 40 mg by mouth daily.) 90 tablet 3   potassium chloride SA (KLOR-CON M) 20 MEQ tablet Take 1 tablet (20 mEq total) by mouth daily. 90 tablet 3   pravastatin (PRAVACHOL) 40 MG tablet Take 1 tablet (40 mg total) by mouth daily. 90 tablet 3   pregabalin (LYRICA) 75 MG capsule Take 75 mg by mouth 2 (two) times daily.     No current facility-administered medications for this visit.        Physical Exam BP 124/70   Pulse 83   Resp 16   Wt 174 lb 6.4 oz (79.1 kg)   BMI 28.15 kg/m  Gen:  WD/WN, NAD Skin: incision C/D/I     Assessment/Plan:  Atherosclerosis of artery of extremity with rest pain (HCC) Noninvasive studies today showed normal triphasic and biphasic waveforms throughout the left lower extremity without significant flow limitation  all the way down into the tibial vessels. He continues to have significant neuropathic pain but given his profound ischemia for many months prior to revascularization, there is probably very little I can do for this.  I will give him a prescription for Lyrica today.  He says that he has a referral to a pain doctor next month and that sounds reasonable to me.  He should continue his Pravachol, Plavix, and aspirin.  We will recheck him in 2 to 3 months with noninvasive studies.  Smoking cessation would be  of great benefit to the durability of his surgery and interventions.  Hypertension blood pressure control important in reducing the progression of atherosclerotic disease. On appropriate oral medications.   Type 2 diabetes mellitus with diabetic neuropathy, with long-term current use of insulin (HCC) blood pressure control important in reducing the progression of atherosclerotic disease. On appropriate oral medications.      Festus Barren 07/20/2023, 11:51 AM   This note was created with Dragon medical transcription system.  Any errors from dictation are unintentional.

## 2023-07-20 NOTE — Assessment & Plan Note (Signed)
 Noninvasive studies today showed normal triphasic and biphasic waveforms throughout the left lower extremity without significant flow limitation all the way down into the tibial vessels. He continues to have significant neuropathic pain but given his profound ischemia for many months prior to revascularization, there is probably very little I can do for this.  I will give him a prescription for Lyrica today.  He says that he has a referral to a pain doctor next month and that sounds reasonable to me.  He should continue his Pravachol, Plavix, and aspirin.  We will recheck him in 2 to 3 months with noninvasive studies.  Smoking cessation would be of great benefit to the durability of his surgery and interventions.

## 2023-07-26 ENCOUNTER — Ambulatory Visit (INDEPENDENT_AMBULATORY_CARE_PROVIDER_SITE_OTHER): Payer: 59 | Admitting: Nurse Practitioner

## 2023-07-26 ENCOUNTER — Encounter (INDEPENDENT_AMBULATORY_CARE_PROVIDER_SITE_OTHER): Payer: Self-pay

## 2023-09-01 ENCOUNTER — Other Ambulatory Visit: Payer: Self-pay | Admitting: Otolaryngology

## 2023-09-01 DIAGNOSIS — R221 Localized swelling, mass and lump, neck: Secondary | ICD-10-CM

## 2023-09-06 ENCOUNTER — Other Ambulatory Visit

## 2023-09-09 ENCOUNTER — Ambulatory Visit
Admission: RE | Admit: 2023-09-09 | Discharge: 2023-09-09 | Disposition: A | Discharge status: Home / Self Care | Source: Ambulatory Visit | Attending: Otolaryngology | Admitting: Otolaryngology

## 2023-09-09 DIAGNOSIS — R221 Localized swelling, mass and lump, neck: Secondary | ICD-10-CM

## 2023-09-09 MED ORDER — IOPAMIDOL (ISOVUE-300) INJECTION 61%
75.0000 mL | Freq: Once | INTRAVENOUS | Status: AC | PRN
Start: 2023-09-09 — End: 2023-09-09
  Administered 2023-09-09: 75 mL via INTRAVENOUS

## 2023-09-21 ENCOUNTER — Other Ambulatory Visit (INDEPENDENT_AMBULATORY_CARE_PROVIDER_SITE_OTHER): Payer: Self-pay | Admitting: Vascular Surgery

## 2023-09-21 ENCOUNTER — Other Ambulatory Visit: Payer: Self-pay | Admitting: Student

## 2023-09-21 DIAGNOSIS — I739 Peripheral vascular disease, unspecified: Secondary | ICD-10-CM

## 2023-09-21 DIAGNOSIS — R4182 Altered mental status, unspecified: Secondary | ICD-10-CM

## 2023-09-23 ENCOUNTER — Ambulatory Visit
Admission: RE | Admit: 2023-09-23 | Discharge: 2023-09-23 | Disposition: A | Discharge status: Home / Self Care | Source: Ambulatory Visit | Attending: Student | Admitting: Student

## 2023-09-23 DIAGNOSIS — R4182 Altered mental status, unspecified: Secondary | ICD-10-CM | POA: Diagnosis present

## 2023-09-28 ENCOUNTER — Ambulatory Visit (INDEPENDENT_AMBULATORY_CARE_PROVIDER_SITE_OTHER): Payer: 59

## 2023-09-28 ENCOUNTER — Ambulatory Visit (INDEPENDENT_AMBULATORY_CARE_PROVIDER_SITE_OTHER): Payer: 59 | Admitting: Vascular Surgery

## 2023-09-28 ENCOUNTER — Encounter (INDEPENDENT_AMBULATORY_CARE_PROVIDER_SITE_OTHER): Payer: Self-pay | Admitting: Vascular Surgery

## 2023-09-28 VITALS — BP 130/83 | HR 90 | Resp 16 | Wt 179.0 lb

## 2023-09-28 DIAGNOSIS — Z794 Long term (current) use of insulin: Secondary | ICD-10-CM

## 2023-09-28 DIAGNOSIS — F17200 Nicotine dependence, unspecified, uncomplicated: Secondary | ICD-10-CM

## 2023-09-28 DIAGNOSIS — I70229 Atherosclerosis of native arteries of extremities with rest pain, unspecified extremity: Secondary | ICD-10-CM | POA: Diagnosis not present

## 2023-09-28 DIAGNOSIS — I70222 Atherosclerosis of native arteries of extremities with rest pain, left leg: Secondary | ICD-10-CM

## 2023-09-28 DIAGNOSIS — I739 Peripheral vascular disease, unspecified: Secondary | ICD-10-CM

## 2023-09-28 DIAGNOSIS — E1140 Type 2 diabetes mellitus with diabetic neuropathy, unspecified: Secondary | ICD-10-CM

## 2023-09-28 DIAGNOSIS — Z9889 Other specified postprocedural states: Secondary | ICD-10-CM

## 2023-09-28 DIAGNOSIS — I10 Essential (primary) hypertension: Secondary | ICD-10-CM

## 2023-09-28 NOTE — Progress Notes (Signed)
 MRN : [id number]  Nikoloz Kurowski is a 68 y.o. (11-02-1955) male who presents with chief complaint of  Chief Complaint  Patient presents with   Follow-up    3 month abi and aorta iliac follow up  .  History of Present Illness: Patient returns today in follow up of his PAD.  He still has some neuropathic pain both from his sciatica as well as his prolonged ischemia prior to revascularization as he did not seek care for many weeks.  His foot has been warm.  He has been able to walk.  He had noninvasive studies today which showed normal triphasic waveforms with normal digital pressures and ABIs of greater than 1 bilaterally.  Current Outpatient Medications  Medication Sig Dispense Refill   ACCU-CHEK GUIDE test strip 1 each (1 strip total) 2 (two) times daily Use as instructed.     albuterol  (VENTOLIN  HFA) 108 (90 Base) MCG/ACT inhaler Inhale 2 puffs into the lungs every 6 (six) hours as needed.     BD PEN NEEDLE NANO 2ND GEN 32G X 4 MM MISC See admin instructions.     celecoxib  (CELEBREX ) 200 MG capsule Take 200 mg by mouth daily.     clonazePAM  (KLONOPIN ) 0.5 MG tablet Take 1 tablet (0.5 mg total) by mouth at bedtime as needed. (Patient taking differently: Take 0.5 mg by mouth at bedtime as needed for anxiety.) 10 tablet 5   clopidogrel  (PLAVIX ) 75 MG tablet TAKE 1 TABLET BY MOUTH EVERY DAY 90 tablet 3   Continuous Glucose Receiver (DEXCOM G7 RECEIVER) DEVI USE 1 EACH AS DIRECTED TO MONITOR BLOOD SUGARS DAILY     Continuous Glucose Sensor (DEXCOM G7 SENSOR) MISC USE 1 EACH EVERY 10 (TEN) DAYS     cyclobenzaprine  (FLEXERIL ) 10 MG tablet Take 10 mg by mouth 2 (two) times daily as needed.     famotidine  (PEPCID ) 20 MG tablet Take 20 mg by mouth 2 (two) times daily.     fenofibrate  (TRICOR ) 145 MG tablet Take 145 mg by mouth daily.     glipiZIDE  (GLUCOTROL  XL) 2.5 MG 24 hr tablet Take 2.5 mg by mouth daily.     hydrochlorothiazide  (HYDRODIURIL ) 25 MG tablet Take 25 mg by mouth daily.      insulin  glargine (LANTUS  SOLOSTAR) 100 UNIT/ML Solostar Pen Inject 12 Units into the skin daily. 15 mL 5   JANUVIA  100 MG tablet Take 100 mg by mouth daily.     metFORMIN  (GLUCOPHAGE -XR) 500 MG 24 hr tablet TAKE 1 TABLET BY MOUTH DAILY WITH SUPPER. (Patient taking differently: Take 500 mg by mouth daily with breakfast.) 90 tablet 0   nortriptyline  (PAMELOR ) 10 MG capsule Take 20 mg by mouth at bedtime.     oxyCODONE  (OXY IR/ROXICODONE ) 5 MG immediate release tablet Take 1-2 tablets (5-10 mg total) by mouth every 6 (six) hours as needed for severe pain (pain score 7-10). 30 tablet 0   pantoprazole  (PROTONIX ) 40 MG tablet Take 1 tablet (40 mg total) by mouth daily before breakfast. (Patient taking differently: Take 40 mg by mouth daily.) 90 tablet 3   potassium chloride  SA (KLOR-CON  M) 20 MEQ tablet Take 1 tablet (20 mEq total) by mouth daily. 90 tablet 3   pravastatin  (PRAVACHOL ) 40 MG tablet Take 1 tablet (40 mg total) by mouth daily. 90 tablet 3   pregabalin  (LYRICA ) 75 MG capsule Take 75 mg by mouth 2 (two) times daily.     pregabalin  (LYRICA ) 75 MG capsule Take 1 capsule (75  mg total) by mouth 2 (two) times daily. 60 capsule 2   No current facility-administered medications for this visit.    Past Medical History:  Diagnosis Date   Acute bronchitis 2015   Adrenal mass (HCC)    Atherosclerosis of native arteries of extremities with gangrene, bilateral legs (HCC)    Bilateral carotid artery stenosis    Cancer (HCC)    Chronic pain syndrome    Chronic radicular lumbar pain 2022   COPD (chronic obstructive pulmonary disease) (HCC)    Coronary artery disease    Degeneration of thoracic intervertebral disc    Depression    Diabetes mellitus without complication (HCC)    Dyspnea    GERD (gastroesophageal reflux disease)    Helicobacter pylori infection    Hematochezia 2021   Hyperlipidemia    Hypertension    Influenza B 2015   Intercostal neuralgia    Intestinal metaplasia of gastric  mucosa    Lumbar facet joint syndrome    Nephrolithiasis    Neuroforaminal stenosis of lumbar spine    Other intervertebral disc degeneration, lumbar region with discogenic back pain only    Pre-diabetes    Prostate cancer (HCC) 2015   Smoking    Stroke (HCC)    Subcutaneous nodule    Type 2 diabetes mellitus with diabetic nephropathy, with long-term current use of insulin  (HCC)    Vertigo    Volume overload 2021    Past Surgical History:  Procedure Laterality Date   CHOLECYSTECTOMY     ENDARTERECTOMY FEMORAL Left 06/30/2023   Procedure: ENDARTERECTOMY FEMORAL;  Surgeon: Celso College, MD;  Location: [facility];  Service: Vascular;  Laterality: Left;   INSERTION OF ILIAC STENT Bilateral 06/30/2023   Procedure: INSERTION OF ILIAC STENT;  Surgeon: Celso College, MD;  Location: [facility];  Service: Vascular;  Laterality: Bilateral;   INSERTION PROSTATE RADIATION SEED     LOWER EXTREMITY ANGIOGRAPHY Left 08/03/2022   Procedure: Lower Extremity Angiography;  Surgeon: Celso College, MD;  Location: [facility];  Service: Cardiovascular;  Laterality: Left;   LOWER EXTREMITY ANGIOGRAPHY Left 06/14/2023   Procedure: Lower Extremity Angiography;  Surgeon: Celso College, MD;  Location: [facility];  Service: Cardiovascular;  Laterality: Left;   PROSTATE SURGERY     SHOULDER SURGERY Bilateral      Social History   Tobacco Use   Smoking status: Every Day    Types: Cigars   Smokeless tobacco: Never  Vaping Use   Vaping status: Never Used  Substance Use Topics   Alcohol use: Not Currently   Drug use: Never      Family History  Problem Relation Age of Onset   Heart attack Father    Diabetes Father    Heart attack Paternal Uncle    Lung cancer Maternal Grandmother    Lung cancer Paternal Grandmother      Allergies  Allergen Reactions   Latex Rash     REVIEW OF SYSTEMS (Negative unless checked)   Constitutional: [] Weight loss  [] Fever  [] Chills Cardiac: [] Chest  pain   [] Chest pressure   [] Palpitations   [] Shortness of breath when laying flat   [] Shortness of breath at rest   [] Shortness of breath with exertion. Vascular:  [x] Pain in legs with walking   [] Pain in legs at rest   [] Pain in legs when laying flat   [] Claudication   [] Pain in feet when walking  [x] Pain in feet at rest  [] Pain in  feet when laying flat   [] History of DVT   [] Phlebitis   [x] Swelling in legs   [] Varicose veins   [] Non-healing ulcers Pulmonary:   [] Uses home oxygen   [] Productive cough   [] Hemoptysis   [] Wheeze  [] COPD   [] Asthma Neurologic:  [] Dizziness  [] Blackouts   [] Seizures   [] History of stroke   [] History of TIA  [] Aphasia   [] Temporary blindness   [] Dysphagia   [] Weakness or numbness in arms   [x] Weakness or numbness in legs Musculoskeletal:  [] Arthritis   [] Joint swelling   [] Joint pain   [] Low back pain Hematologic:  [] Easy bruising  [] Easy bleeding   [] Hypercoagulable state   [] Anemic   Gastrointestinal:  [] Blood in stool   [] Vomiting blood  [] Gastroesophageal reflux/heartburn   [] Abdominal pain Genitourinary:  [] Chronic kidney disease   [] Difficult urination  [] Frequent urination  [] Burning with urination   [] Hematuria Skin:  [] Rashes   [] Ulcers   [] Wounds Psychological:  [] History of anxiety   []  History of major depression.  Physical Examination  BP 130/83   Pulse 90   Resp 16   Wt 179 lb (81.2 kg)   BMI 28.89 kg/m  Gen:  WD/WN, NAD Head: [place]/AT, No temporalis wasting. Ear/Nose/Throat: Hearing grossly intact, nares w/o erythema or drainage Eyes: Conjunctiva clear. Sclera non-icteric Neck: Supple.  Trachea midline Pulmonary:  Good air movement, no use of accessory muscles.  Cardiac: RRR, no JVD Vascular:  Vessel Right Left  Radial Palpable Palpable                          PT Palpable Palpable  DP Palpable Palpable   Gastrointestinal: soft, non-tender/non-distended. No guarding/reflex.  Musculoskeletal: M/S 5/5 throughout.  No deformity or atrophy.  No edema. Neurologic: Sensation grossly intact in extremities.  Symmetrical.  Speech is fluent.  Psychiatric: Judgment intact, Mood & affect appropriate for pt's clinical situation. Dermatologic: No rashes or ulcers noted.  No cellulitis or open wounds.      Labs Recent Results (from the past 2160 hours)  ABO/Rh     Status: None   Collection Time: 06/30/23 10:13 AM  Result Value Ref Range   ABO/RH(D)      O POS Performed at [facility], [address] [place], [place] [address]   Glucose, capillary     Status: Abnormal   Collection Time: 06/30/23 10:29 AM  Result Value Ref Range   Glucose-Capillary 137 (H) 70 - 99 mg/dL    Comment: Glucose reference range applies only to samples taken after fasting for at least 8 hours.  Glucose, capillary     Status: Abnormal   Collection Time: 06/30/23  3:13 PM  Result Value Ref Range   Glucose-Capillary 180 (H) 70 - 99 mg/dL    Comment: Glucose reference range applies only to samples taken after fasting for at least 8 hours.  Glucose, capillary     Status: Abnormal   Collection Time: 06/30/23  4:41 PM  Result Value Ref Range   Glucose-Capillary 189 (H) 70 - 99 mg/dL    Comment: Glucose reference range applies only to samples taken after fasting for at least 8 hours.  CBC     Status: None   Collection Time: 06/30/23  4:59 PM  Result Value Ref Range   WBC 9.2 4.0 - 10.5 K/uL   RBC 5.00 4.22 - 5.81 MIL/uL   Hemoglobin 16.0 13.0 - 17.0 g/dL   HCT 60.4 54.0 - 98.1 %  MCV 94.2 80.0 - 100.0 fL   MCH 32.0 26.0 - 34.0 pg   MCHC 34.0 30.0 - 36.0 g/dL   RDW 16.1 09.6 - 04.5 %   Platelets 177 150 - 400 K/uL   nRBC 0.0 0.0 - 0.2 %    Comment: Performed at [facility], [address] [place], [place] [address]  Creatinine, serum     Status: None   Collection Time: 06/30/23  4:59 PM  Result Value Ref Range   Creatinine, Ser 1.21 0.61 - 1.24 mg/dL   GFR, Estimated >19 >14 mL/min    Comment: (NOTE) Calculated using the  CKD-EPI Creatinine Equation (2021) Performed at [facility], [address] [place], [place] [address]   Glucose, capillary     Status: Abnormal   Collection Time: 06/30/23  9:02 PM  Result Value Ref Range   Glucose-Capillary 321 (H) 70 - 99 mg/dL    Comment: Glucose reference range applies only to samples taken after fasting for at least 8 hours.  CBC     Status: Abnormal   Collection Time: 07/01/23  1:27 AM  Result Value Ref Range   WBC 9.6 4.0 - 10.5 K/uL   RBC 4.35 4.22 - 5.81 MIL/uL   Hemoglobin 14.1 13.0 - 17.0 g/dL   HCT 62.1 30.8 - 65.7 %   MCV 89.7 80.0 - 100.0 fL   MCH 32.4 26.0 - 34.0 pg   MCHC 36.2 (H) 30.0 - 36.0 g/dL   RDW 84.6 96.2 - 95.2 %   Platelets 185 150 - 400 K/uL   nRBC 0.0 0.0 - 0.2 %    Comment: Performed at [facility], [address], [place], [place] [address]  Basic metabolic panel     Status: Abnormal   Collection Time: 07/01/23  1:27 AM  Result Value Ref Range   Sodium 135 135 - 145 mmol/L   Potassium 3.3 (L) 3.5 - 5.1 mmol/L   Chloride 101 98 - 111 mmol/L   CO2 25 22 - 32 mmol/L   Glucose, Bld 214 (H) 70 - 99 mg/dL    Comment: Glucose reference range applies only to samples taken after fasting for at least 8 hours.   BUN 21 8 - 23 mg/dL   Creatinine, Ser 4.40 (H) 0.61 - 1.24 mg/dL   Calcium 8.2 (L) 8.9 - 10.3 mg/dL   GFR, Estimated >10 >27 mL/min    Comment: (NOTE) Calculated using the CKD-EPI Creatinine Equation (2021)    Anion gap 9 5 - 15    Comment: Performed at [facility], [address] [place], [place] [address]  Hemoglobin A1c     Status: Abnormal   Collection Time: 07/01/23  1:27 AM  Result Value Ref Range   Hgb A1c MFr Bld 7.9 (H) 4.8 - 5.6 %    Comment: (NOTE) Pre diabetes:          5.7%-6.4%  Diabetes:              >6.4%  Glycemic control for   <7.0% adults with diabetes    Mean Plasma Glucose 180.03 mg/dL    Comment: Performed at [facility], [address] [place],  [place] [address]  Glucose, capillary     Status: Abnormal   Collection Time: 07/01/23  7:33 AM  Result Value Ref Range   Glucose-Capillary 186 (H) 70 - 99 mg/dL    Comment: Glucose reference range applies only to samples taken after fasting for at  least 8 hours.  Glucose, capillary     Status: Abnormal   Collection Time: 07/01/23 11:33 AM  Result Value Ref Range   Glucose-Capillary 166 (H) 70 - 99 mg/dL    Comment: Glucose reference range applies only to samples taken after fasting for at least 8 hours.  Glucose, capillary     Status: Abnormal   Collection Time: 07/01/23  3:42 PM  Result Value Ref Range   Glucose-Capillary 183 (H) 70 - 99 mg/dL    Comment: Glucose reference range applies only to samples taken after fasting for at least 8 hours.  Glucose, capillary     Status: Abnormal   Collection Time: 07/01/23  7:52 PM  Result Value Ref Range   Glucose-Capillary 231 (H) 70 - 99 mg/dL    Comment: Glucose reference range applies only to samples taken after fasting for at least 8 hours.  Glucose, capillary     Status: Abnormal   Collection Time: 07/02/23  7:34 AM  Result Value Ref Range   Glucose-Capillary 137 (H) 70 - 99 mg/dL    Comment: Glucose reference range applies only to samples taken after fasting for at least 8 hours.  Glucose, capillary     Status: Abnormal   Collection Time: 07/02/23 11:06 AM  Result Value Ref Range   Glucose-Capillary 181 (H) 70 - 99 mg/dL    Comment: Glucose reference range applies only to samples taken after fasting for at least 8 hours.  Glucose, capillary     Status: Abnormal   Collection Time: 07/02/23  5:12 PM  Result Value Ref Range   Glucose-Capillary 190 (H) 70 - 99 mg/dL    Comment: Glucose reference range applies only to samples taken after fasting for at least 8 hours.  Glucose, capillary     Status: Abnormal   Collection Time: 07/02/23  9:30 PM  Result Value Ref Range   Glucose-Capillary 153 (H) 70 - 99 mg/dL    Comment: Glucose reference  range applies only to samples taken after fasting for at least 8 hours.  Glucose, capillary     Status: Abnormal   Collection Time: 07/03/23  7:38 AM  Result Value Ref Range   Glucose-Capillary 129 (H) 70 - 99 mg/dL    Comment: Glucose reference range applies only to samples taken after fasting for at least 8 hours.  Glucose, capillary     Status: Abnormal   Collection Time: 07/03/23 11:34 AM  Result Value Ref Range   Glucose-Capillary 183 (H) 70 - 99 mg/dL    Comment: Glucose reference range applies only to samples taken after fasting for at least 8 hours.  Glucose, capillary     Status: Abnormal   Collection Time: 07/03/23  5:07 PM  Result Value Ref Range   Glucose-Capillary 180 (H) 70 - 99 mg/dL    Comment: Glucose reference range applies only to samples taken after fasting for at least 8 hours.  Glucose, capillary     Status: Abnormal   Collection Time: 07/03/23  8:43 PM  Result Value Ref Range   Glucose-Capillary 189 (H) 70 - 99 mg/dL    Comment: Glucose reference range applies only to samples taken after fasting for at least 8 hours.   Comment 1 Notify RN   Glucose, capillary     Status: Abnormal   Collection Time: 07/04/23  7:56 AM  Result Value Ref Range   Glucose-Capillary 170 (H) 70 - 99 mg/dL    Comment: Glucose reference range applies only to samples  taken after fasting for at least 8 hours.  Glucose, capillary     Status: Abnormal   Collection Time: 07/04/23 12:10 PM  Result Value Ref Range   Glucose-Capillary 160 (H) 70 - 99 mg/dL    Comment: Glucose reference range applies only to samples taken after fasting for at least 8 hours.  Glucose, capillary     Status: Abnormal   Collection Time: 07/04/23  4:07 PM  Result Value Ref Range   Glucose-Capillary 169 (H) 70 - 99 mg/dL    Comment: Glucose reference range applies only to samples taken after fasting for at least 8 hours.  Glucose, capillary     Status: Abnormal   Collection Time: 07/04/23  9:35 PM  Result Value  Ref Range   Glucose-Capillary 148 (H) 70 - 99 mg/dL    Comment: Glucose reference range applies only to samples taken after fasting for at least 8 hours.  Glucose, capillary     Status: Abnormal   Collection Time: 07/05/23  8:04 AM  Result Value Ref Range   Glucose-Capillary 133 (H) 70 - 99 mg/dL    Comment: Glucose reference range applies only to samples taken after fasting for at least 8 hours.  Glucose, capillary     Status: Abnormal   Collection Time: 07/05/23 11:26 AM  Result Value Ref Range   Glucose-Capillary 135 (H) 70 - 99 mg/dL    Comment: Glucose reference range applies only to samples taken after fasting for at least 8 hours.    Radiology CT SOFT TISSUE NECK W CONTRAST Result Date: 09/22/2023 CLINICAL DATA:  68 year old male, right side neck mass, lymphadenopathy Creatinine was obtained on site at [facility] Results: Creatinine 1.5 mg/dL. EXAM: CT NECK WITH CONTRAST TECHNIQUE: Multidetector CT imaging of the neck was performed using the standard protocol following the bolus administration of intravenous contrast. RADIATION DOSE REDUCTION: This exam was performed according to the departmental dose-optimization program which includes automated exposure control, adjustment of the mA and/or kV according to patient size and/or use of iterative reconstruction technique. CONTRAST:  75mL ISOVUE -300 IOPAMIDOL  (ISOVUE -300) INJECTION 61% COMPARISON:  Chest CT 05/12/2023.  Head CT 10/11/2020. FINDINGS: Pharynx and larynx: Glottis is closed. Laryngeal soft tissue contours are otherwise within normal limits. Negative epiglottis. Gas containing roughly 1 cm Pharyngocele adjacent to the hyoid on the left (series 3, image 58). Otherwise pharyngeal soft tissue contours are within normal limits. No pharyngeal or laryngeal hyperenhancement identified. And otherwise negative parapharyngeal and retropharyngeal spaces. Salivary glands: Negative sublingual space. Submandibular glands and  parotid glands are within normal limits. Thyroid: Negative. Lymph nodes: Negative. Diminutive bilateral cervical lymph nodes. No lymph node enlargement or heterogeneity. Vascular: Major vascular structures in the bilateral neck and at the skull base are enhancing and patent. Soft more so than calcified cervical carotid atherosclerosis, most pronounced at the left carotid bifurcation. Right vertebral artery appears to be dominant (normal variant). Limited intracranial: Calcified ICA siphon atherosclerosis. Negative for age visible brain parenchyma. Visualized orbits: Negative. Mastoids and visualized paranasal sinuses: Well aerated bilaterally. Skeleton: Absent dentition. Mild for age cervical spine degeneration. No acute or suspicious osseous lesion. Upper chest: Advanced soft and calcified aortic arch atherosclerosis. Incidental 4 vessel arch, non dominant left vertebral artery arises directly from the arch (normal variant). Otherwise negative visible superior mediastinum. Centrilobular emphysema throughout the visible upper lungs. Other: Midline suboccipital subcutaneous circumscribed and low-density mass measuring about 2.2 cm diameter (series 3, image 33 and sagittal image 44) has simple fluid density and communicates  with the dermis on sagittal image 42, compatible with a large sebaceous or similar dermal cyst. Right lateral neck marked area of clinical concern on series 3, image 54 corresponds to a 2nd similar lesion which is subcutaneous, inseparable from the dermis, circumscribed, about 2 cm diameter (coronal image 47) and has simple fluid density. No regional inflammation or other complicating features. IMPRESSION: 1. Round and circumscribed subcutaneous sebaceous cysts or similar dermal/subdermal lesions are present at the right neck marked area of clinical concern (2 cm) and in the midline suboccipital soft tissues (2.2 cm). No associated inflammation or other complicating features. 2. No superimposed  Neck mass or lymphadenopathy. Incidental left side Pharyngocele. 3. Aortic Atherosclerosis (ICD10-I70.0) and Emphysema (ICD10-J43.9). Electronically Signed   By: Marlise Simpers M.D.   On: 09/22/2023 04:50    Assessment/Plan  Atherosclerosis of artery of extremity with rest pain (HCC) He had noninvasive studies today which showed normal triphasic waveforms with normal digital pressures and ABIs of greater than 1 bilaterally.  Perfusion has been restored.  Still with neuropathic pain.  He is on Lyrica  75 mg twice daily.  He will continue his Plavix  and statin agent.  Follow-up in 6 months.  Smoking cessation again discussed today.  Tobacco use disorder We had a discussion for approximately 3 minutes regarding the absolute need for smoking cessation due to the deleterious nature of tobacco on the vascular system. We discussed the tobacco use would diminish patency of any intervention, and likely significantly worsen progressio of disease. We discussed multiple agents for quitting including replacement therapy or medications to reduce cravings such as Chantix. The patient voices their understanding of the importance of smoking cessation.   Hypertension blood pressure control important in reducing the progression of atherosclerotic disease. On appropriate oral medications.     Type 2 diabetes mellitus with diabetic neuropathy, with long-term current use of insulin  (HCC) blood glucose control important in reducing the progression of atherosclerotic disease. Also, involved in wound healing. On appropriate medications.  Mikki Alexander, MD  09/28/2023 9:46 AM    This note was created with Dragon medical transcription system.  Any errors from dictation are purely unintentional

## 2023-09-28 NOTE — Assessment & Plan Note (Signed)
We had a discussion for approximately 3 minutes regarding the absolute need for smoking cessation due to the deleterious nature of tobacco on the vascular system. We discussed the tobacco use would diminish patency of any intervention, and likely significantly worsen progressio of disease. We discussed multiple agents for quitting including replacement therapy or medications to reduce cravings such as Chantix. The patient voices their understanding of the importance of smoking cessation.  

## 2023-09-28 NOTE — Assessment & Plan Note (Signed)
 He had noninvasive studies today which showed normal triphasic waveforms with normal digital pressures and ABIs of greater than 1 bilaterally.  Perfusion has been restored.  Still with neuropathic pain.  He is on Lyrica  75 mg twice daily.  He will continue his Plavix  and statin agent.  Follow-up in 6 months.  Smoking cessation again discussed today.

## 2023-10-01 LAB — VAS US ABI WITH/WO TBI
Left ABI: 1.08
Right ABI: 1.06

## 2023-10-12 ENCOUNTER — Encounter (INDEPENDENT_AMBULATORY_CARE_PROVIDER_SITE_OTHER): Payer: Self-pay

## 2023-12-31 ENCOUNTER — Telehealth (INDEPENDENT_AMBULATORY_CARE_PROVIDER_SITE_OTHER): Payer: Self-pay | Admitting: Vascular Surgery

## 2023-12-31 NOTE — Telephone Encounter (Signed)
 Barbara (Significant Other) LVM stating patient needs to get in & see the doctor- legs are swelling and hurting real bad since stents were put in. Patient already has fu scheduled 03/31/24. Please advised what should be scheduled for patient. Call back# [phone]

## 2023-12-31 NOTE — Telephone Encounter (Signed)
 Tried to call patient x4 to move appointment up. Unable to leave message on phone left on voicemail by [facility]. Called main phone# on chart, NA, LVM for pt to call [facility].

## 2023-12-31 NOTE — Telephone Encounter (Signed)
 Patient called back. Moved appt up to 01/21/24

## 2023-12-31 NOTE — Telephone Encounter (Signed)
 He can come in sooner with currently ordered studies.  However, patient should be advised that there is a good chance this is related to his lower back, which we would not treat

## 2024-01-13 ENCOUNTER — Other Ambulatory Visit (INDEPENDENT_AMBULATORY_CARE_PROVIDER_SITE_OTHER): Payer: Self-pay | Admitting: Nurse Practitioner

## 2024-01-13 DIAGNOSIS — M79669 Pain in unspecified lower leg: Secondary | ICD-10-CM

## 2024-01-13 DIAGNOSIS — Z9889 Other specified postprocedural states: Secondary | ICD-10-CM

## 2024-01-21 ENCOUNTER — Encounter (INDEPENDENT_AMBULATORY_CARE_PROVIDER_SITE_OTHER): Payer: Self-pay | Admitting: Vascular Surgery

## 2024-01-21 ENCOUNTER — Ambulatory Visit (INDEPENDENT_AMBULATORY_CARE_PROVIDER_SITE_OTHER): Admitting: Vascular Surgery

## 2024-01-21 ENCOUNTER — Ambulatory Visit (INDEPENDENT_AMBULATORY_CARE_PROVIDER_SITE_OTHER)

## 2024-01-21 VITALS — BP 127/78 | HR 89 | Ht 66.0 in | Wt 173.4 lb

## 2024-01-21 DIAGNOSIS — E1140 Type 2 diabetes mellitus with diabetic neuropathy, unspecified: Secondary | ICD-10-CM

## 2024-01-21 DIAGNOSIS — F17200 Nicotine dependence, unspecified, uncomplicated: Secondary | ICD-10-CM

## 2024-01-21 DIAGNOSIS — Z9889 Other specified postprocedural states: Secondary | ICD-10-CM

## 2024-01-21 DIAGNOSIS — I10 Essential (primary) hypertension: Secondary | ICD-10-CM

## 2024-01-21 DIAGNOSIS — M79669 Pain in unspecified lower leg: Secondary | ICD-10-CM

## 2024-01-21 DIAGNOSIS — I739 Peripheral vascular disease, unspecified: Secondary | ICD-10-CM

## 2024-01-21 DIAGNOSIS — M7989 Other specified soft tissue disorders: Secondary | ICD-10-CM

## 2024-01-21 DIAGNOSIS — Z794 Long term (current) use of insulin: Secondary | ICD-10-CM

## 2024-01-21 NOTE — Progress Notes (Signed)
 `  Subjective:    Patient ID: Devin Mayer, male    DOB: 25-Mar-1956, 68 y.o.   MRN: [id number] Chief Complaint  Patient presents with   Follow-up    6 month follow up     Devin Mayer is a 68 year old male who presents to clinic for follow-up of his PAD.  He still has some neuropathic pain both from his sciatica as well as his prolonged ischemia prior to revascularization as he did not seek care for many weeks.  His foot has been warm.  He has been walking.  He had noninvasive studies today which showed normal triphasic waveforms with normal digital pressures and ABIs of greater than 1 bilaterally.    Review of Systems  Constitutional: Negative.   Musculoskeletal:  Positive for myalgias.  All other systems reviewed and are negative.      Objective:   Physical Exam Vitals reviewed.  Constitutional:      Appearance: Normal appearance. He is normal weight.  HENT:     Head: Normocephalic.  Eyes:     Pupils: Pupils are equal, round, and reactive to light.  Cardiovascular:     Rate and Rhythm: Normal rate and regular rhythm.     Pulses: Normal pulses.     Heart sounds: Normal heart sounds.  Pulmonary:     Effort: Pulmonary effort is normal.     Breath sounds: Normal breath sounds.  Abdominal:     General: Abdomen is flat. Bowel sounds are normal.     Palpations: Abdomen is soft.  Musculoskeletal:        General: Tenderness present. Normal range of motion.     Comments: Positive for neuropathy  Skin:    General: Skin is warm and dry.     Capillary Refill: Capillary refill takes 2 to 3 seconds.  Neurological:     General: No focal deficit present.     Mental Status: He is alert and oriented to person, place, and time. Mental status is at baseline.     Comments: Positive for neuropathy  Psychiatric:        Mood and Affect: Mood normal.        Behavior: Behavior normal.        Thought Content: Thought content normal.        Judgment: Judgment normal.     BP 127/78    Pulse 89   Ht 5' 6 (1.676 m)   Wt 173 lb 6.4 oz (78.7 kg)   BMI 27.99 kg/m   Past Medical History:  Diagnosis Date   Acute bronchitis 2015   Adrenal mass (HCC)    Atherosclerosis of native arteries of extremities with gangrene, bilateral legs (HCC)    Bilateral carotid artery stenosis    Cancer (HCC)    Chronic pain syndrome    Chronic radicular lumbar pain 2022   COPD (chronic obstructive pulmonary disease) (HCC)    Coronary artery disease    Degeneration of thoracic intervertebral disc    Depression    Diabetes mellitus without complication (HCC)    Dyspnea    GERD (gastroesophageal reflux disease)    Helicobacter pylori infection    Hematochezia 2021   Hyperlipidemia    Hypertension    Influenza B 2015   Intercostal neuralgia    Intestinal metaplasia of gastric mucosa    Lumbar facet joint syndrome    Nephrolithiasis    Neuroforaminal stenosis of lumbar spine    Other intervertebral disc degeneration, lumbar region with discogenic back  pain only    Pre-diabetes    Prostate cancer (HCC) 2015   Smoking    Stroke (HCC)    Subcutaneous nodule    Type 2 diabetes mellitus with diabetic nephropathy, with long-term current use of insulin  (HCC)    Vertigo    Volume overload 2021    Social History   Socioeconomic History   Marital status: Divorced    Spouse name: Heron   Number of children: Not on file   Years of education: Not on file   Highest education level: Not on file  Occupational History   Not on file  Tobacco Use   Smoking status: Every Day    Types: Cigars   Smokeless tobacco: Never  Vaping Use   Vaping status: Never Used  Substance and Sexual Activity   Alcohol use: Not Currently   Drug use: Never   Sexual activity: Not on file  Other Topics Concern   Not on file  Social History Narrative   Not on file   Social Drivers of Health   Financial Resource Strain: Low Risk  (04/28/2023)   Received [facility]   Overall  Financial Resource Strain (CARDIA)    Difficulty of Paying Living Expenses: Not hard at all  Food Insecurity: No Food Insecurity (06/30/2023)   Hunger Vital Sign    Worried About Running Out of Food in the Last Year: Never true    Ran Out of Food in the Last Year: Never true  Transportation Needs: No Transportation Needs (06/30/2023)   PRAPARE - Administrator, Civil Service (Medical): No    Lack of Transportation (Non-Medical): No  Physical Activity: Sufficiently Active (12/23/2022)   Received from [facility]   Exercise Vital Sign    On average, how many days per week do you engage in moderate to strenuous exercise (like a brisk walk)?: 7 days    On average, how many minutes do you engage in exercise at this level?: 30 min  Stress: No Stress Concern Present (12/23/2022)   Received from [facility] Occupational [facility] - Occupational Stress Questionnaire    Feeling of Stress : Only a little  Social Connections: Unknown (06/30/2023)   Social Connection and Isolation Panel    Frequency of Communication with Friends and Family: Never    Frequency of Social Gatherings with Friends and Family: Never    Attends Religious Services: Never    Database administrator or Organizations: No    Attends Banker Meetings: Never    Marital Status: Patient declined  Catering manager Violence: Not At Risk (06/30/2023)   Humiliation, Afraid, Rape, and Kick questionnaire    Fear of Current or Ex-Partner: No    Emotionally Abused: No    Physically Abused: No    Sexually Abused: No    Past Surgical History:  Procedure Laterality Date   CHOLECYSTECTOMY     ENDARTERECTOMY FEMORAL Left 06/30/2023   Procedure: ENDARTERECTOMY FEMORAL;  Surgeon: Marea Selinda RAMAN, MD;  Location: [facility] ORS;  Service: Vascular;  Laterality: Left;   INSERTION OF ILIAC STENT Bilateral 06/30/2023   Procedure: INSERTION OF ILIAC STENT;  Surgeon: Marea Selinda RAMAN, MD;   Location: [facility] OR[facility];  Service: Vascular;  Laterality: Bilateral;   INSERTION PROSTATE RADIATION SEED     LOWER EXTREMITY ANGIOGRAPHY Left 08/03/2022   Procedure: Lower Extremity Angiography;  Surgeon: Marea Selinda RAMAN, MD;  Location: [facility];  Service: Cardiovascular;  Laterality: Left;   LOWER EXTREMITY ANGIOGRAPHY Left 06/14/2023   Procedure: Lower Extremity Angiography;  Surgeon: Marea Selinda RAMAN, MD;  Location: [facility];  Service: Cardiovascular;  Laterality: Left;   PROSTATE SURGERY     SHOULDER SURGERY Bilateral     Family History  Problem Relation Age of Onset   Heart attack Father    Diabetes Father    Heart attack Paternal Uncle    Lung cancer Maternal Grandmother    Lung cancer Paternal Grandmother     Allergies  Allergen Reactions   Latex Rash       Latest Ref Rng & Units 07/01/2023    1:27 AM 06/30/2023    4:59 PM 06/23/2023   11:56 AM  CBC  WBC 4.0 - 10.5 K/uL 9.6  9.2  7.3   Hemoglobin 13.0 - 17.0 g/dL 85.8  83.9  81.8   Hematocrit 39.0 - 52.0 % 39.0  47.1  50.6   Platelets 150 - 400 K/uL 185  177  232       CMP     Component Value Date/Time   NA 135 07/01/2023 0127   NA 137 04/13/2013 1040   K 3.3 (L) 07/01/2023 0127   K 4.3 04/13/2013 1040   CL 101 07/01/2023 0127   CL 107 04/13/2013 1040   CO2 25 07/01/2023 0127   CO2 25 04/13/2013 1040   GLUCOSE 214 (H) 07/01/2023 0127   GLUCOSE 155 (H) 04/13/2013 1040   BUN 21 07/01/2023 0127   BUN 15 04/13/2013 1040   CREATININE 1.26 (H) 07/01/2023 0127   CREATININE 1.20 07/02/2021 1522   CALCIUM 8.2 (L) 07/01/2023 0127   CALCIUM 9.3 04/13/2013 1040   PROT 7.9 08/21/2021 1633   PROT 7.3 04/13/2013 1040   ALBUMIN 4.2 08/21/2021 1633   ALBUMIN 4.1 04/13/2013 1040   AST 37 08/21/2021 1633   AST 23 04/13/2013 1040   ALT 58 (H) 08/21/2021 1633   ALT 32 04/13/2013 1040   ALKPHOS 82 08/21/2021 1633   ALKPHOS 98 04/13/2013 1040   BILITOT 0.8 08/21/2021 1633   BILITOT 0.6 04/13/2013 1040   EGFR  68 07/02/2021 1522   GFRNONAA >60 07/01/2023 0127   GFRNONAA >60 04/13/2013 1040     No results found.     Assessment & Plan:   1. Peripheral arterial disease with history of revascularization (HCC) (Primary) He had noninvasive studies today which showed normal triphasic waveforms with normal digital pressures and ABIs of greater than 1 bilaterally.  Perfusion remains restored.  Still with neuropathic pain.  He is on Lyrica  75 mg twice daily.  He will continue his Plavix  and statin agent.  Follow-up in 3 months.   Right ABI Today = 1.11  Previous ABI = 1.09 Left ABI Today = 1.11     Previous ABI = 1.18  All wave forms are triphasic    2. Primary hypertension Continue antihypertensive medications as already ordered, these medications have been reviewed and there are no changes at this time.  3. Type 2 diabetes mellitus with diabetic neuropathy, with long-term current use of insulin  (HCC) Continue hypoglycemic medications as already ordered, these medications have been reviewed and there are no changes at this time.  Hgb A1C to be monitored as already arranged by primary service  4. Tobacco use disorder I did 10-minute conversation with the patient today about eliminating smoking from his lifestyle.  Patient continues to work on smoking cessation on his own.  Current Outpatient Medications on File Prior to Visit  Medication Sig Dispense Refill   ACCU-CHEK GUIDE test strip 1 each (1 strip total) 2 (two) times daily Use as instructed.     albuterol  (VENTOLIN  HFA) 108 (90 Base) MCG/ACT inhaler Inhale 2 puffs into the lungs every 6 (six) hours as needed.     BD PEN NEEDLE NANO 2ND GEN 32G X 4 MM MISC See admin instructions.     celecoxib  (CELEBREX ) 200 MG capsule Take 200 mg by mouth daily.     clonazePAM  (KLONOPIN ) 0.5 MG tablet Take 1 tablet (0.5 mg total) by mouth at bedtime as needed. (Patient taking differently: Take 0.5 mg by mouth at bedtime as needed for anxiety.) 10 tablet  5   clopidogrel  (PLAVIX ) 75 MG tablet TAKE 1 TABLET BY MOUTH EVERY DAY 90 tablet 3   Continuous Glucose Receiver (DEXCOM G7 RECEIVER) DEVI USE 1 EACH AS DIRECTED TO MONITOR BLOOD SUGARS DAILY     Continuous Glucose Sensor (DEXCOM G7 SENSOR) MISC USE 1 EACH EVERY 10 (TEN) DAYS     cyclobenzaprine  (FLEXERIL ) 10 MG tablet Take 10 mg by mouth 2 (two) times daily as needed.     famotidine  (PEPCID ) 20 MG tablet Take 20 mg by mouth 2 (two) times daily.     fenofibrate  (TRICOR ) 145 MG tablet Take 145 mg by mouth daily.     glipiZIDE  (GLUCOTROL  XL) 2.5 MG 24 hr tablet Take 2.5 mg by mouth daily.     hydrochlorothiazide  (HYDRODIURIL ) 25 MG tablet Take 25 mg by mouth daily.     insulin  glargine (LANTUS  SOLOSTAR) 100 UNIT/ML Solostar Pen Inject 12 Units into the skin daily. 15 mL 5   JANUVIA  100 MG tablet Take 100 mg by mouth daily.     metFORMIN  (GLUCOPHAGE -XR) 500 MG 24 hr tablet TAKE 1 TABLET BY MOUTH DAILY WITH SUPPER. (Patient taking differently: Take 500 mg by mouth daily with breakfast.) 90 tablet 0   nortriptyline  (PAMELOR ) 10 MG capsule Take 20 mg by mouth at bedtime.     oxyCODONE  (OXY IR/ROXICODONE ) 5 MG immediate release tablet Take 1-2 tablets (5-10 mg total) by mouth every 6 (six) hours as needed for severe pain (pain score 7-10). 30 tablet 0   pantoprazole  (PROTONIX ) 40 MG tablet Take 1 tablet (40 mg total) by mouth daily before breakfast. (Patient taking differently: Take 40 mg by mouth daily.) 90 tablet 3   potassium chloride  SA (KLOR-CON  M) 20 MEQ tablet Take 1 tablet (20 mEq total) by mouth daily. 90 tablet 3   pravastatin  (PRAVACHOL ) 40 MG tablet Take 1 tablet (40 mg total) by mouth daily. 90 tablet 3   pregabalin  (LYRICA ) 75 MG capsule Take 75 mg by mouth 2 (two) times daily.     pregabalin  (LYRICA ) 75 MG capsule Take 1 capsule (75 mg total) by mouth 2 (two) times daily. 60 capsule 2   No current facility-administered medications on file prior to visit.    There are no Patient  Instructions on file for this visit. No follow-ups on file.   Gwendlyn JONELLE Shank, NP

## 2024-01-25 LAB — VAS US ABI WITH/WO TBI
Left ABI: 1.11
Right ABI: 1.11

## 2024-03-10 ENCOUNTER — Other Ambulatory Visit (INDEPENDENT_AMBULATORY_CARE_PROVIDER_SITE_OTHER): Payer: Self-pay | Admitting: Vascular Surgery

## 2024-03-31 ENCOUNTER — Ambulatory Visit (INDEPENDENT_AMBULATORY_CARE_PROVIDER_SITE_OTHER): Admitting: Vascular Surgery

## 2024-03-31 ENCOUNTER — Encounter (INDEPENDENT_AMBULATORY_CARE_PROVIDER_SITE_OTHER)

## 2024-04-18 ENCOUNTER — Other Ambulatory Visit (INDEPENDENT_AMBULATORY_CARE_PROVIDER_SITE_OTHER): Payer: Self-pay | Admitting: Vascular Surgery

## 2024-04-18 DIAGNOSIS — Z9889 Other specified postprocedural states: Secondary | ICD-10-CM

## 2024-04-24 ENCOUNTER — Ambulatory Visit (INDEPENDENT_AMBULATORY_CARE_PROVIDER_SITE_OTHER)

## 2024-04-24 ENCOUNTER — Ambulatory Visit (INDEPENDENT_AMBULATORY_CARE_PROVIDER_SITE_OTHER): Admitting: Vascular Surgery

## 2024-04-24 ENCOUNTER — Encounter (INDEPENDENT_AMBULATORY_CARE_PROVIDER_SITE_OTHER): Payer: Self-pay | Admitting: Vascular Surgery

## 2024-04-24 VITALS — BP 128/77 | HR 80 | Resp 17 | Ht 66.0 in | Wt 173.0 lb

## 2024-04-24 DIAGNOSIS — F17200 Nicotine dependence, unspecified, uncomplicated: Secondary | ICD-10-CM

## 2024-04-24 DIAGNOSIS — E1140 Type 2 diabetes mellitus with diabetic neuropathy, unspecified: Secondary | ICD-10-CM | POA: Diagnosis not present

## 2024-04-24 DIAGNOSIS — I739 Peripheral vascular disease, unspecified: Secondary | ICD-10-CM

## 2024-04-24 DIAGNOSIS — I10 Essential (primary) hypertension: Secondary | ICD-10-CM | POA: Diagnosis not present

## 2024-04-24 DIAGNOSIS — Z794 Long term (current) use of insulin: Secondary | ICD-10-CM

## 2024-04-24 DIAGNOSIS — Z9889 Other specified postprocedural states: Secondary | ICD-10-CM | POA: Diagnosis not present

## 2024-04-24 NOTE — Progress Notes (Signed)
 Subjective:    Patient ID: Devin Mayer, male    DOB: January 15, 1956, 68 y.o.   MRN: [id number] Chief Complaint  Patient presents with   Follow-up    3 months + ABI    Devin Mayer is a 68 yo male who returns to clinic today in follow up for revascularization of his lower extremities.   Date of Surgery: 06/14/2023   Surgeon(s):DEW,JASON     Assistants:none   Pre-operative Diagnosis: PAD with rest pain LLE   Post-operative diagnosis:  Same   Procedure(s) Performed:             1.  Ultrasound guidance for vascular access right femoral artery             2.  Catheter placement into aorta from right femoral approach             3.  Aortogram and selective left lower extremity angiogram             4.  StarClose closure device right femoral artery   PROCEDURE:06/30/2023 1.   Left common femoral and superficial femoral artery endarterectomies and patch angioplasty 2.   Ultrasound guidance for vascular access to the right common femoral artery with 7 French sheath 3.   Mechanical thrombectomy to the left external and common iliac arteries using the 8 French Rota Rex thrombectomy device 4.   Percutaneous stent placement to the right common iliac artery with 9 mm diameter by 38 mm length Lifestream stent postdilated to 10 mm 5.   Stent placement to the left common iliac artery with 9 mm diameter by 58 mm length Lifestream stent 6.   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  He still has some neuropathic pain both from his sciatica as well as his prolonged ischemia prior to revascularization as he did not seek care for many weeks.  His feet remain warm.  He  has been able to walk.  He had noninvasive studies today which showed normal triphasic waveforms with normal digital pressures and ABIs of greater than 1 bilaterally.  Right ABI Today  1.21  Previous ABI  1.11 Left ABI Today    1.20  Previous ABI  1.11 All wave forms are triphasic.      Discussed the use of AI scribe software for clinical note transcription with the patient, who gave verbal consent to proceed.  History of Present Illness            Results           Review of Systems  Constitutional: Negative.   Musculoskeletal:  Positive for back pain and myalgias.  All other systems reviewed and are negative.      Objective:   Physical Exam Vitals reviewed.  Constitutional:      Appearance: Normal appearance. He is normal weight.  HENT:     Head: Normocephalic and atraumatic.  Eyes:     Pupils: Pupils are equal, round, and reactive to light.  Cardiovascular:     Rate and Rhythm: Normal rate and regular rhythm.     Pulses: Normal pulses.     Heart sounds:  Normal heart sounds.  Pulmonary:     Effort: Pulmonary effort is normal.     Breath sounds: Normal breath sounds.  Abdominal:     General: Abdomen is flat. Bowel sounds are normal.     Palpations: Abdomen is soft.  Musculoskeletal:        General: Signs of injury present. Normal range of motion.     Comments: Known prior back issues with sciatica to his left leg and nerve pain.  Skin:    General: Skin is warm and dry.     Capillary Refill: Capillary refill takes 2 to 3 seconds.  Neurological:     General: No focal deficit present.     Mental Status: He is alert and oriented to person, place, and time. Mental status is at baseline.  Psychiatric:        Mood and Affect: Mood normal.        Behavior: Behavior normal.        Thought Content: Thought content normal.        Judgment: Judgment normal.     Physical Exam          BP 128/77   Pulse 80   Resp 17   Ht 5' 6 (1.676 m)   Wt 173 lb (78.5  kg)   BMI 27.92 kg/m   Past Medical History:  Diagnosis Date   Acute bronchitis 2015   Adrenal mass    Atherosclerosis of native arteries of extremities with gangrene, bilateral legs (HCC)    Bilateral carotid artery stenosis    Cancer (HCC)    Chronic pain syndrome    Chronic radicular lumbar pain 2022   COPD (chronic obstructive pulmonary disease) (HCC)    Coronary artery disease    Degeneration of thoracic intervertebral disc    Depression    Diabetes mellitus without complication (HCC)    Dyspnea    GERD (gastroesophageal reflux disease)    Helicobacter pylori infection    Hematochezia 2021   Hyperlipidemia    Hypertension    Influenza B 2015   Intercostal neuralgia    Intestinal metaplasia of gastric mucosa    Lumbar facet joint syndrome    Nephrolithiasis    Neuroforaminal stenosis of lumbar spine    Other intervertebral disc degeneration, lumbar region with discogenic back pain only    Pre-diabetes    Prostate cancer (HCC) 2015   Smoking    Stroke (HCC)    Subcutaneous nodule    Type 2 diabetes mellitus with diabetic nephropathy, with long-term current use of insulin  (HCC)    Vertigo    Volume overload 2021    Social History   Socioeconomic History   Marital status: Divorced    Spouse name: Heron   Number of children: Not on file   Years of education: Not on file   Highest education level: Not on file  Occupational History   Not on file  Tobacco Use   Smoking status: Every Day    Types: Cigars   Smokeless tobacco: Never  Vaping Use   Vaping status: Never Used  Substance and Sexual Activity   Alcohol use: Not Currently   Drug use: Never   Sexual activity: Not on file  Other Topics Concern   Not on file  Social History Narrative   Not on file   Social Drivers of Health   Financial Resource Strain: Low Risk  (04/28/2023)   Received from [facility]   Overall Financial Resource Strain (  CARDIA)    Difficulty of Paying Living  Expenses: Not hard at all  Food Insecurity: No Food Insecurity (06/30/2023)   Hunger Vital Sign    Worried About Running Out of Food in the Last Year: Never true    Ran Out of Food in the Last Year: Never true  Transportation Needs: No Transportation Needs (06/30/2023)   PRAPARE - Administrator, Civil Service (Medical): No    Lack of Transportation (Non-Medical): No  Physical Activity: Sufficiently Active (12/23/2022)   Received from [facility]   Exercise Vital Sign    On average, how many days per week do you engage in moderate to strenuous exercise (like a brisk walk)?: 7 days    On average, how many minutes do you engage in exercise at this level?: 30 min  Stress: No Stress Concern Present (12/23/2022)   Received from [facility] - Occupational Stress Questionnaire    Feeling of Stress : Only a little  Social Connections: Unknown (06/30/2023)   Social Connection and Isolation Panel    Frequency of Communication with Friends and Family: Never    Frequency of Social Gatherings with Friends and Family: Never    Attends Religious Services: Never    Database Administrator or Organizations: No    Attends Banker Meetings: Never    Marital Status: Patient declined  Catering Manager Violence: Not At Risk (06/30/2023)   Humiliation, Afraid, Rape, and Kick questionnaire    Fear of Current or Ex-Partner: No    Emotionally Abused: No    Physically Abused: No    Sexually Abused: No    Past Surgical History:  Procedure Laterality Date   CHOLECYSTECTOMY     ENDARTERECTOMY FEMORAL Left 06/30/2023   Procedure: ENDARTERECTOMY FEMORAL;  Surgeon: Marea Selinda RAMAN, MD;  Location: [facility] ORS;  Service: Vascular;  Laterality: Left;   INSERTION OF ILIAC STENT Bilateral 06/30/2023   Procedure: INSERTION OF ILIAC STENT;  Surgeon: Marea Selinda RAMAN, MD;  Location: [facility] OR[facility];  Service: Vascular;  Laterality: Bilateral;    INSERTION PROSTATE RADIATION SEED     LOWER EXTREMITY ANGIOGRAPHY Left 08/03/2022   Procedure: Lower Extremity Angiography;  Surgeon: Marea Selinda RAMAN, MD;  Location: [facility] INV[facility]IVE [facility];  Service: Cardiovascular;  Laterality: Left;   LOWER EXTREMITY ANGIOGRAPHY Left 06/14/2023   Procedure: Lower Extremity Angiography;  Surgeon: Marea Selinda RAMAN, MD;  Location: [facility] INV[facility] CV [facility];  Service: Cardiovascular;  Laterality: Left;   PROSTATE SURGERY     SHOULDER SURGERY Bilateral     Family History  Problem Relation Age of Onset   Heart attack Father    Diabetes Father    Heart attack Paternal Uncle    Lung cancer Maternal Grandmother    Lung cancer Paternal Grandmother     Allergies  Allergen Reactions   Latex Rash       Latest Ref Rng & Units 07/01/2023    1:27 AM 06/30/2023    4:59 PM 06/23/2023   11:56 AM  CBC  WBC 4.0 - 10.5 K/uL 9.6  9.2  7.3   Hemoglobin 13.0 - 17.0 g/dL 85.8  83.9  81.8   Hematocrit 39.0 - 52.0 % 39.0  47.1  50.6   Platelets 150 - 400 K/uL 185  177  232       CMP     Component Value Date/Time   NA 135 07/01/2023 0127   NA  137 04/13/2013 1040   K 3.3 (L) 07/01/2023 0127   K 4.3 04/13/2013 1040   CL 101 07/01/2023 0127   CL 107 04/13/2013 1040   CO2 25 07/01/2023 0127   CO2 25 04/13/2013 1040   GLUCOSE 214 (H) 07/01/2023 0127   GLUCOSE 155 (H) 04/13/2013 1040   BUN 21 07/01/2023 0127   BUN 15 04/13/2013 1040   CREATININE 1.26 (H) 07/01/2023 0127   CREATININE 1.20 07/02/2021 1522   CALCIUM 8.2 (L) 07/01/2023 0127   CALCIUM 9.3 04/13/2013 1040   PROT 7.9 08/21/2021 1633   PROT 7.3 04/13/2013 1040   ALBUMIN 4.2 08/21/2021 1633   ALBUMIN 4.1 04/13/2013 1040   AST 37 08/21/2021 1633   AST 23 04/13/2013 1040   ALT 58 (H) 08/21/2021 1633   ALT 32 04/13/2013 1040   ALKPHOS 82 08/21/2021 1633   ALKPHOS 98 04/13/2013 1040   BILITOT 0.8 08/21/2021 1633   BILITOT 0.6 04/13/2013 1040   EGFR 68 07/02/2021 1522   GFRNONAA >60 07/01/2023 0127   GFRNONAA >60  04/13/2013 1040     No results found.     Assessment & Plan:   1. Peripheral arterial disease with history of revascularization (Primary) Patient returns to clinic today for 44-month follow-up evaluation of his left lower extremity revascularization.  He underwent arterial duplex ultrasounds today which have ABIs bilaterally greater than 1 and triphasic waveforms throughout.  He complains of a sharp nerve pain down his left leg but this is his prior sciatica and back issues.  He has no new sores or ulcers to note.  He is walking continuously to help with perfusion.  He has no swelling as he elevates his legs is much as possible.  Patient to follow-up in 3 months with bilateral lower extremity arterial duplex ultrasounds with ABIs.  2. Primary hypertension Continue antihypertensive medications as already ordered, these medications have been reviewed and there are no changes at this time.  3. Type 2 diabetes mellitus with diabetic neuropathy, with long-term current use of insulin  (HCC) Continue hypoglycemic medications as already ordered, these medications have been reviewed and there are no changes at this time.  Hgb A1C to be monitored as already arranged by primary service  4. Tobacco use disorder I had greater than 10-minute conversation with the patient regarding smoking.  He could smell the smoke on him sitting in clinic today.  I advised him that smoking needs to stop today.  He is going ruin his vasculature to lose left lower extremity that we fixed if he continues to smoke at the rate he is.  I offered him smoking cessation today with some medication to try to help.  He refuses at this time but verbalizes he will try to make an effort to cut back on his smoking.   Assessment and Plan              Current Outpatient Medications on File Prior to Visit  Medication Sig Dispense Refill   albuterol  (VENTOLIN  HFA) 108 (90 Base) MCG/ACT inhaler Inhale 2 puffs into the lungs every 6  (six) hours as needed.     BD PEN NEEDLE NANO 2ND GEN 32G X 4 MM MISC See admin instructions.     celecoxib  (CELEBREX ) 200 MG capsule Take 200 mg by mouth daily.     clonazePAM  (KLONOPIN ) 0.5 MG tablet Take 1 tablet (0.5 mg total) by mouth at bedtime as needed. (Patient taking differently: Take 0.5 mg by mouth at bedtime as needed  for anxiety.) 10 tablet 5   clopidogrel  (PLAVIX ) 75 MG tablet TAKE 1 TABLET BY MOUTH EVERY DAY 90 tablet 3   Continuous Glucose Receiver (DEXCOM G7 RECEIVER) DEVI USE 1 EACH AS DIRECTED TO MONITOR BLOOD SUGARS DAILY     Continuous Glucose Sensor (DEXCOM G7 SENSOR) MISC USE 1 EACH EVERY 10 (TEN) DAYS     cyclobenzaprine  (FLEXERIL ) 10 MG tablet Take 10 mg by mouth 2 (two) times daily as needed.     famotidine  (PEPCID ) 20 MG tablet Take 20 mg by mouth 2 (two) times daily.     fenofibrate  (TRICOR ) 145 MG tablet Take 145 mg by mouth daily.     glipiZIDE  (GLUCOTROL  XL) 2.5 MG 24 hr tablet Take 2.5 mg by mouth daily.     hydrochlorothiazide  (HYDRODIURIL ) 25 MG tablet Take 25 mg by mouth daily.     insulin  glargine (LANTUS  SOLOSTAR) 100 UNIT/ML Solostar Pen Inject 12 Units into the skin daily. 15 mL 5   JANUVIA  100 MG tablet Take 100 mg by mouth daily.     metFORMIN  (GLUCOPHAGE -XR) 500 MG 24 hr tablet TAKE 1 TABLET BY MOUTH DAILY WITH SUPPER. (Patient taking differently: Take 500 mg by mouth daily with breakfast.) 90 tablet 0   nortriptyline  (PAMELOR ) 10 MG capsule Take 20 mg by mouth at bedtime.     oxyCODONE  (OXY IR/ROXICODONE ) 5 MG immediate release tablet Take 1-2 tablets (5-10 mg total) by mouth every 6 (six) hours as needed for severe pain (pain score 7-10). 30 tablet 0   pantoprazole  (PROTONIX ) 40 MG tablet Take 1 tablet (40 mg total) by mouth daily before breakfast. (Patient taking differently: Take 40 mg by mouth daily.) 90 tablet 3   potassium chloride  SA (KLOR-CON  M) 20 MEQ tablet Take 1 tablet (20 mEq total) by mouth daily. 90 tablet 3   pravastatin  (PRAVACHOL ) 40 MG  tablet Take 1 tablet (40 mg total) by mouth daily. 90 tablet 3   pregabalin  (LYRICA ) 75 MG capsule Take 75 mg by mouth 2 (two) times daily.     pregabalin  (LYRICA ) 75 MG capsule TAKE 1 CAPSULE BY MOUTH TWICE A DAY 60 capsule 3   No current facility-administered medications on file prior to visit.    There are no Patient Instructions on file for this visit. No follow-ups on file.   Gwendlyn JONELLE Shank, NP

## 2024-04-26 LAB — VAS US ABI WITH/WO TBI
Left ABI: 1.2
Right ABI: 1.21

## 2024-07-24 ENCOUNTER — Encounter (INDEPENDENT_AMBULATORY_CARE_PROVIDER_SITE_OTHER)

## 2024-07-24 ENCOUNTER — Ambulatory Visit (INDEPENDENT_AMBULATORY_CARE_PROVIDER_SITE_OTHER): Admitting: Vascular Surgery

## 2024-08-01 ENCOUNTER — Encounter (INDEPENDENT_AMBULATORY_CARE_PROVIDER_SITE_OTHER)

## 2024-08-01 ENCOUNTER — Ambulatory Visit (INDEPENDENT_AMBULATORY_CARE_PROVIDER_SITE_OTHER): Admitting: Vascular Surgery

## 2024-11-27 ENCOUNTER — Ambulatory Visit (INDEPENDENT_AMBULATORY_CARE_PROVIDER_SITE_OTHER): Admitting: Nurse Practitioner

## 2024-11-27 ENCOUNTER — Encounter (INDEPENDENT_AMBULATORY_CARE_PROVIDER_SITE_OTHER)
